# Patient Record
Sex: Female | Born: 1955
Health system: Southern US, Community
[De-identification: ages and names within clinical notes are randomized; demographics above are authoritative.]

## PROBLEM LIST (undated history)

## (undated) DIAGNOSIS — G8929 Other chronic pain: Secondary | ICD-10-CM

## (undated) DIAGNOSIS — M1990 Unspecified osteoarthritis, unspecified site: Secondary | ICD-10-CM

## (undated) DIAGNOSIS — M542 Cervicalgia: Secondary | ICD-10-CM

## (undated) DIAGNOSIS — J189 Pneumonia, unspecified organism: Secondary | ICD-10-CM

## (undated) DIAGNOSIS — K635 Polyp of colon: Secondary | ICD-10-CM

## (undated) DIAGNOSIS — J309 Allergic rhinitis, unspecified: Secondary | ICD-10-CM

## (undated) DIAGNOSIS — Z800 Family history of malignant neoplasm of digestive organs: Secondary | ICD-10-CM

## (undated) DIAGNOSIS — F419 Anxiety disorder, unspecified: Secondary | ICD-10-CM

## (undated) DIAGNOSIS — Z87442 Personal history of urinary calculi: Secondary | ICD-10-CM

## (undated) DIAGNOSIS — N809 Endometriosis, unspecified: Secondary | ICD-10-CM

## (undated) DIAGNOSIS — G4700 Insomnia, unspecified: Secondary | ICD-10-CM

## (undated) DIAGNOSIS — N951 Menopausal and female climacteric states: Secondary | ICD-10-CM

## (undated) DIAGNOSIS — Z9889 Other specified postprocedural states: Secondary | ICD-10-CM

## (undated) DIAGNOSIS — E039 Hypothyroidism, unspecified: Secondary | ICD-10-CM

## (undated) DIAGNOSIS — R112 Nausea with vomiting, unspecified: Secondary | ICD-10-CM

## (undated) DIAGNOSIS — Z8371 Family history of colonic polyps: Secondary | ICD-10-CM

## (undated) DIAGNOSIS — T7840XA Allergy, unspecified, initial encounter: Secondary | ICD-10-CM

## (undated) DIAGNOSIS — K589 Irritable bowel syndrome without diarrhea: Secondary | ICD-10-CM

## (undated) DIAGNOSIS — Z87448 Personal history of other diseases of urinary system: Secondary | ICD-10-CM

## (undated) DIAGNOSIS — Z83719 Family history of colon polyps, unspecified: Secondary | ICD-10-CM

## (undated) HISTORY — PX: OTHER SURGICAL HISTORY: SHX169

## (undated) HISTORY — PX: CARPAL TUNNEL RELEASE: SHX101

## (undated) HISTORY — DX: Allergic rhinitis, unspecified: J30.9

## (undated) HISTORY — DX: Family history of colon polyps, unspecified: Z83.719

## (undated) HISTORY — DX: Irritable bowel syndrome without diarrhea: K58.9

## (undated) HISTORY — DX: Cervicalgia: M54.2

## (undated) HISTORY — DX: Menopausal and female climacteric states: N95.1

## (undated) HISTORY — DX: Allergy, unspecified, initial encounter: T78.40XA

## (undated) HISTORY — DX: Anxiety disorder, unspecified: F41.9

## (undated) HISTORY — DX: Polyp of colon: K63.5

## (undated) HISTORY — PX: GALLBLADDER SURGERY: SHX652

## (undated) HISTORY — DX: Endometriosis, unspecified: N80.9

## (undated) HISTORY — DX: Insomnia, unspecified: G47.00

## (undated) HISTORY — DX: Other chronic pain: G89.29

## (undated) HISTORY — DX: Family history of malignant neoplasm of digestive organs: Z80.0

## (undated) HISTORY — DX: Family history of colonic polyps: Z83.71

## (undated) HISTORY — PX: ABDOMINAL HYSTERECTOMY: SHX81

## (undated) HISTORY — PX: DILATION AND CURETTAGE OF UTERUS: SHX78

## (undated) HISTORY — PX: SPINE SURGERY: SHX786

## (undated) HISTORY — DX: Hypothyroidism, unspecified: E03.9

## (undated) HISTORY — DX: Personal history of other diseases of urinary system: Z87.448

---

## 1998-01-10 ENCOUNTER — Encounter: Admission: RE | Admit: 1998-01-10 | Discharge: 1998-04-10 | Payer: Self-pay | Admitting: Anesthesiology

## 1998-03-08 ENCOUNTER — Inpatient Hospital Stay (HOSPITAL_COMMUNITY): Admission: RE | Admit: 1998-03-08 | Discharge: 1998-03-09 | Payer: Self-pay | Admitting: Neurosurgery

## 1998-04-09 ENCOUNTER — Ambulatory Visit (HOSPITAL_COMMUNITY): Admission: RE | Admit: 1998-04-09 | Discharge: 1998-04-09 | Payer: Self-pay

## 1998-06-11 ENCOUNTER — Ambulatory Visit (HOSPITAL_COMMUNITY): Admission: RE | Admit: 1998-06-11 | Discharge: 1998-06-11 | Payer: Self-pay | Admitting: Neurosurgery

## 1998-06-11 ENCOUNTER — Encounter: Payer: Self-pay | Admitting: Neurosurgery

## 1998-09-17 ENCOUNTER — Ambulatory Visit (HOSPITAL_COMMUNITY): Admission: RE | Admit: 1998-09-17 | Discharge: 1998-09-17 | Payer: Self-pay | Admitting: Neurosurgery

## 1998-09-17 ENCOUNTER — Encounter: Payer: Self-pay | Admitting: Neurosurgery

## 1999-01-02 ENCOUNTER — Encounter: Payer: Self-pay | Admitting: Neurosurgery

## 1999-01-02 ENCOUNTER — Ambulatory Visit (HOSPITAL_COMMUNITY): Admission: RE | Admit: 1999-01-02 | Discharge: 1999-01-02 | Payer: Self-pay | Admitting: Neurosurgery

## 1999-04-19 ENCOUNTER — Ambulatory Visit (HOSPITAL_COMMUNITY): Admission: RE | Admit: 1999-04-19 | Discharge: 1999-04-19 | Payer: Self-pay | Admitting: Neurosurgery

## 1999-04-19 ENCOUNTER — Encounter: Payer: Self-pay | Admitting: Neurosurgery

## 1999-10-24 ENCOUNTER — Other Ambulatory Visit: Admission: RE | Admit: 1999-10-24 | Discharge: 1999-10-24 | Payer: Self-pay | Admitting: Gynecology

## 2000-01-02 ENCOUNTER — Encounter: Payer: Self-pay | Admitting: Family Medicine

## 2000-01-02 ENCOUNTER — Ambulatory Visit (HOSPITAL_COMMUNITY): Admission: RE | Admit: 2000-01-02 | Discharge: 2000-01-02 | Payer: Self-pay | Admitting: Gastroenterology

## 2000-04-22 ENCOUNTER — Encounter: Admission: RE | Admit: 2000-04-22 | Discharge: 2000-04-22 | Payer: Self-pay | Admitting: Gynecology

## 2000-04-22 ENCOUNTER — Encounter: Payer: Self-pay | Admitting: Gynecology

## 2000-04-28 ENCOUNTER — Encounter: Payer: Self-pay | Admitting: Gynecology

## 2000-04-28 ENCOUNTER — Encounter: Admission: RE | Admit: 2000-04-28 | Discharge: 2000-04-28 | Payer: Self-pay | Admitting: Gynecology

## 2001-11-29 ENCOUNTER — Encounter: Admission: RE | Admit: 2001-11-29 | Discharge: 2001-11-29 | Payer: Self-pay | Admitting: Internal Medicine

## 2001-11-29 ENCOUNTER — Encounter: Payer: Self-pay | Admitting: Internal Medicine

## 2002-08-15 ENCOUNTER — Encounter: Admission: RE | Admit: 2002-08-15 | Discharge: 2002-08-15 | Payer: Self-pay | Admitting: Family Medicine

## 2002-08-15 ENCOUNTER — Encounter: Payer: Self-pay | Admitting: Family Medicine

## 2002-08-17 ENCOUNTER — Encounter: Admission: RE | Admit: 2002-08-17 | Discharge: 2002-08-17 | Payer: Self-pay | Admitting: Family Medicine

## 2002-08-17 ENCOUNTER — Encounter: Payer: Self-pay | Admitting: Family Medicine

## 2003-01-16 ENCOUNTER — Other Ambulatory Visit: Admission: RE | Admit: 2003-01-16 | Discharge: 2003-01-16 | Payer: Self-pay | Admitting: Gynecology

## 2004-05-26 ENCOUNTER — Emergency Department (HOSPITAL_COMMUNITY): Admission: EM | Admit: 2004-05-26 | Discharge: 2004-05-26 | Payer: Self-pay | Admitting: Emergency Medicine

## 2004-07-10 ENCOUNTER — Ambulatory Visit (HOSPITAL_COMMUNITY): Admission: RE | Admit: 2004-07-10 | Discharge: 2004-07-10 | Payer: Self-pay | Admitting: Gastroenterology

## 2005-08-06 ENCOUNTER — Encounter: Admission: RE | Admit: 2005-08-06 | Discharge: 2005-08-06 | Payer: Self-pay | Admitting: Family Medicine

## 2007-01-07 ENCOUNTER — Encounter: Admission: RE | Admit: 2007-01-07 | Discharge: 2007-01-07 | Payer: Self-pay | Admitting: Family Medicine

## 2008-03-24 ENCOUNTER — Encounter: Admission: RE | Admit: 2008-03-24 | Discharge: 2008-03-24 | Payer: Self-pay | Admitting: Surgery

## 2008-05-05 ENCOUNTER — Inpatient Hospital Stay (HOSPITAL_COMMUNITY): Admission: EM | Admit: 2008-05-05 | Discharge: 2008-05-13 | Payer: Self-pay | Admitting: Emergency Medicine

## 2008-05-12 ENCOUNTER — Encounter (INDEPENDENT_AMBULATORY_CARE_PROVIDER_SITE_OTHER): Payer: Self-pay | Admitting: Diagnostic Radiology

## 2008-05-15 ENCOUNTER — Ambulatory Visit (HOSPITAL_COMMUNITY): Admission: RE | Admit: 2008-05-15 | Discharge: 2008-05-15 | Payer: Self-pay | Admitting: Family Medicine

## 2008-05-26 ENCOUNTER — Ambulatory Visit: Payer: Self-pay | Admitting: Internal Medicine

## 2008-05-26 DIAGNOSIS — J90 Pleural effusion, not elsewhere classified: Secondary | ICD-10-CM | POA: Insufficient documentation

## 2008-05-29 ENCOUNTER — Telehealth: Payer: Self-pay | Admitting: Internal Medicine

## 2008-06-12 ENCOUNTER — Ambulatory Visit: Payer: Self-pay | Admitting: Internal Medicine

## 2008-06-13 LAB — CONVERTED CEMR LAB
BUN: 10 mg/dL (ref 6–23)
Basophils Absolute: 0.1 10*3/uL (ref 0.0–0.1)
Calcium: 9.2 mg/dL (ref 8.4–10.5)
Creatinine, Ser: 0.6 mg/dL (ref 0.4–1.2)
Eosinophils Absolute: 0.4 10*3/uL (ref 0.0–0.7)
GFR calc Af Amer: 134 mL/min
GFR calc non Af Amer: 111 mL/min
Glucose, Bld: 93 mg/dL (ref 70–99)
Hemoglobin: 13.3 g/dL (ref 12.0–15.0)
MCHC: 34.2 g/dL (ref 30.0–36.0)
Neutro Abs: 3.8 10*3/uL (ref 1.4–7.7)
Neutrophils Relative %: 51.9 % (ref 43.0–77.0)
Platelets: 307 10*3/uL (ref 150–400)
Sed Rate: 11 mm/hr (ref 0–22)
WBC: 7.4 10*3/uL (ref 4.5–10.5)

## 2008-06-23 ENCOUNTER — Telehealth: Payer: Self-pay | Admitting: Internal Medicine

## 2008-07-04 ENCOUNTER — Telehealth (INDEPENDENT_AMBULATORY_CARE_PROVIDER_SITE_OTHER): Payer: Self-pay | Admitting: *Deleted

## 2008-07-06 ENCOUNTER — Ambulatory Visit: Payer: Self-pay | Admitting: Cardiology

## 2008-07-26 ENCOUNTER — Ambulatory Visit: Payer: Self-pay | Admitting: Internal Medicine

## 2008-07-27 ENCOUNTER — Telehealth (INDEPENDENT_AMBULATORY_CARE_PROVIDER_SITE_OTHER): Payer: Self-pay | Admitting: *Deleted

## 2008-10-31 ENCOUNTER — Emergency Department (HOSPITAL_COMMUNITY): Admission: EM | Admit: 2008-10-31 | Discharge: 2008-10-31 | Payer: Self-pay | Admitting: Emergency Medicine

## 2010-12-10 NOTE — Group Therapy Note (Signed)
Kara Kennedy, Kara Kennedy NO.:  [id number]   MEDICAL RECORD NO.:  [id number]          PATIENT TYPE:  INP   LOCATION:  A336                          FACILITY:  [facility]   PHYSICIAN:  Dorris Singh, DO    DATE OF BIRTH:  Feb 15, 1956   DATE OF PROCEDURE:  DATE OF DISCHARGE:                                 PROGRESS NOTE   Patient seen today.  States she feels a little bit better, not much  however, but she is having issues with pain from the pleural  effusion,which is worsened.  Discussed with interventional radiology  until Friday they do not have someone who can do the thoracocentesis.  I  explained to the patient we will go ahead and continue to monitor her.  If her breathing gets worse, we will plan on transporting her down to  [facility] for it.  As long as she is stable, we will continue to just  wait until Friday to have it done.   VITAL SIGNS:  Temperature 98.3, pulse rate 104, respirations 24, blood  pressure 117/66.  GENERAL:  The patient is a Caucasian female who is well-developed, well-  nourished, in no acute distress.  HEART:  Tachy sinus, regular rate and rhythm.  S1 and S2 present.  LUNGS:  Clear to auscultation with some decreased breath sounds in the  right lobe.  ABDOMEN:  Soft, nontender, nondistended.  Bowel sounds in all four  quadrants.  EXTREMITIES:  No edema, ecchymosis or cyanosis.   Her labs for today are as follows:  White count of 13.4, hemoglobin of  11.7, hematocrit 34.2 and a platelet count of 284.  Her chemistry:  Sodium 136, potassium 3.4, chloride 99, CO2 30, glucose 128 and BUN 2  and creatinine 0.55.  Her calcium level is 8.3.   ASSESSMENT/PLAN:  1. Acute pneumonia, community-acquired.  She is doing well on the      Zosyn and Zithromax.  We will continue to monitor her with the      pleural effusion.  Plan for thoracocentesis on Friday.  2. Nausea and vomiting.  This is improved.  We will advance her diet.  3. Sepsis, which is  improved.  4. Hypothyroid, which is stable.   Plan on the patient being here for another 2 to 3 days as long as she  continues to improve.      Dorris Singh, DO  Electronically Signed     CB/MEDQ  D:  05/10/2008  T:  05/10/2008  Job:  [id number]

## 2010-12-10 NOTE — H&P (Signed)
NAMEDANAKA, LLERA NO.:  [id number]   MEDICAL RECORD NO.:  [id number]          PATIENT TYPE:  INP   LOCATION:  A336                          FACILITY:  [facility]   PHYSICIAN:  Thomasenia Bottoms, MDDATE OF BIRTH:  January 23, 1956   DATE OF ADMISSION:  05/05/2008  DATE OF DISCHARGE:  LH                              HISTORY & PHYSICAL   CHIEF COMPLAINT:  Pain with breathing.   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  She says the pain is worse on the  right side when she takes a deep breath.  It is so bad that she finds it  hard to take a deep breath.  She is feeling short of breath.  These  symptoms started yesterday.  She said 2 days ago, she just did not feel  well, had no energy, but did not have any specific complaints until  yesterday when this trouble with breathing started.  She has not had any  cough.  She has had episodes of severe sweating.  Also some nausea,  vomiting.  No diarrhea.   PAST MEDICAL HISTORY:  1. Significant for a laparoscopic cholecystectomy which was done as an      outpatient in August 2009.  This was followed by shingles in the      right upper hip area.  The patient did require some prednisone.      She is still having some mild neuropathic pain.  2. She also has a history of hypothyroidism.   MEDICATIONS ON ARRIVAL:  1. Synthroid 100 mcg p.o. daily.  2. Premarin 0.9 mg p.o. daily.  3. Vicodin 5/500 p.r.n.  4. Neurontin 300 mg p.o. t.i.d. p.r.n.   SOCIAL HISTORY:  The patient does not smoke cigarettes or drink a  significant amount of alcohol.  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   FAMILY HISTORY:  Noncontributory.   REVIEW OF SYSTEMS:  CONSTITUTIONAL:  She has a mild headache, but this  is unusual for her.  No double vision.  Her appetite has been excellent.  She has had no weight loss.  HEENT:  No sore throat.  CARDIOVASCULAR:  She has this pleuritic  chest pain as mentioned above.  No lower  extremity edema.  RESPIRATORY:  Dyspnea as mentioned above.  No  hemoptysis.  GI:  She has been nauseated and vomited once or twice.  No  diarrhea.  MUSCULOSKELETAL:  She did have some mild diffuse joint aches  or achiness earlier.  PSYCHIATRIC:  She has no trouble with insomnia.  All other systems reviewed and are negative.   PHYSICAL EXAMINATION:  VITAL SIGNS:  On arrival in the emergency  department, her blood pressure was 112/73, but it did drop to 84/47.  Her temperature max was 99.1, pulse 111, respiratory rate up to 34, O2  sats 91% on room air, 93% on 2 liters nasal cannula.  GENERAL:  On physical examination, the patient is well-nourished, well-  developed, but is slightly tachypneic, slightly ill-appearing.  HEENT:  Normocephalic, atraumatic.  Pupils are equal and round.  Sclerae  nonicteric.  Oral mucosa moist.  NECK:  Supple.  No lymphadenopathy, no thyromegaly, no jugular venous  distention.  CARDIAC:  Regular, slightly tachycardiac.  No murmur  appreciated.  LUNGS:  Reveal almost absent breath sounds in the right base.  She has  some minimal air movement on the left.  She is having a difficult time  taking a deep breath because of the pain and she is splinting.  ABDOMEN:  Soft, nontender, nondistended.  No hepatosplenomegaly.  EXTREMITIES:  Reveal no evidence of clubbing, cyanosis or pitting edema.  Her DP pulses on the left are not palpable.  On the right, they are  2+.  However, skin is warm and dry, good color.  Normal temperature.  NEUROLOGICALLY:  She is alert and oriented x3.  Cranial nerves II-XII  are intact grossly.  She has 5/5 strength in her upper and lower  extremities.  Her sensory exam is intact grossly in her upper and lower  extremities.  Babinski reflexes are equivocal.  She has normal muscle  tone and bulk.  MUSCULOSKELETAL:  Reveals good range of motion.  No effusions of her  joints.  SKIN:  Intact with no  open lesions or rashes.  She does have some  varicosities in the left lower leg and ankle area.   DIAGNOSTICS:  1. Her chest x-ray reports right greater than left pleural effusion      and confluent bibasilar airspace opacity.  2. Her EKG reveals normal sinus rhythm with a rate of 96.  She has      some nonspecific T-wave abnormalities, but no ST-segment elevation      or depression.   LABORATORY DATA:  Albumin is 3.5, AST 24, ALT 20, alk phos 59, total  bili normal at 0.9, sodium is 135, potassium 3.9, chloride 99, bicarb  29, glucose 131, BUN 12, creatinine 0.81, white count of 17.0,  hemoglobin 13.3, hematocrit of 39.3, platelet count is 255.   ASSESSMENT/PLAN:  1. Bibasilar pneumonia and sepsis.  The patient also has bilateral      small pleural effusions.  We will put her on IV Rocephin and      Zithromax.  She was briefly on 1 small course of prednisone, but      has been off it for 1 month, so I do not think there is a high      likelihood of Pseudomonas pneumonia.  She did have 1 day surgery,      but has not been hospitalized, so I am not going to empirically      treat her with vancomycin.  We will go ahead and check an ABG as      she is slightly cachectic.  We will continue her IV fluids and her      oxygen.  With a fluid bolus of 1 liter in the emergency department,      her systolic blood pressure has come from the 80s and is now 108,      so I feel that she is stable for the floor, but she is clearly ill      and will need to be monitored carefully.  2. For her hypothyroidism, we will continue her Synthroid.  3. For her menopausal symptoms, we will continue her Premarin.      Thomasenia Bottoms, MD  Electronically Signed     CVC/MEDQ  D:  05/05/2008  T:  05/06/2008  Job:  [id number]   cc:   [facility]  Fax: [phone]

## 2010-12-10 NOTE — Discharge Summary (Signed)
Kara Kennedy, Kara Kennedy NO.:  [id number]   MEDICAL RECORD NO.:  [id number]          PATIENT TYPE:  INP   LOCATION:  A336                          FACILITY:  [facility]   PHYSICIAN:  Dorris Singh, DO    DATE OF BIRTH:  [birth year]   DATE OF ADMISSION:  05/05/2008  DATE OF DISCHARGE:  10/17/2009LH                               DISCHARGE SUMMARY   ADMISSION DIAGNOSES:  Include  1. Bibasilar pneumonia and sepsis.  2. Hypothyroidism.  3. Menopausal symptoms.   DISCHARGE DIAGNOSES:  Include:  1. Bibasilar pneumonia with pleural effusion.  2. Pleurisy.  3. Hypothyroidism.  4. Menopause.  5. Mild hyponatremia.   Her primary care physician is Dr. Arlyce Dice.   Testing that was done includes radiographic testing. She had on October  9 a two-view of the chest which demonstrated right greater than left  pleural effusion with confluent bibasilar air space opacity.  The latter  could reflect atelectasis, infection, or aspiration. She had an  ultrasound of the abdomen on October 10 which demonstrated status post  cholecystectomy, echogenic foci in the region of the distal common duct  not definitely within the duct, mild predominant bile duct and  pancreatic most likely following cholecystectomy. Mild predominant left  renal collecting system without significant change. On the 13th she had  a two-view chest which showed bibasilar effusions, and atelectasis well  as bibasilar infiltrates, increase in the right pleural effusion since  previous study. On the 16th she had an ultrasound-guided thoracocentesis  which only pulled out 5 mL of tea-colored fluid. And on the 16th she  also had a chest x-ray which showed no pneumothorax following right  thoracocentesis, persistent bibasilar atelectasis, right lower lobe  consolidation, and loculated right pleural effusion.   Her H and P was done by Dr. 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  She was started on IV  Zithromax and those and Rocephin and vancomycin.  She continued to  complain of right-sided chest pain throughout her stay here which is  possibly due to this pleural effusion, even though not much fluid was  pulled. On the 17th she progressively improved.  She was able to  ambulate the halls, and it was determined she could be sent home.   For her menopausal symptoms she was placed on her Premarin. However, due  to the medications that she was on we had to withhold that until the day  prior to admission.   And for her hypothyroidism she was continued on her Synthroid the whole  time.   Her discharge instructions for her medications, she was put on Synthroid  100 mcg one p.o. daily, Premarin 0.9 mg 1 p.o. daily, and Vicodin 5/500  as needed. For her new prescription she was given a Zithromax Z-Pak as  directed, Roxicodone 10 mg 1/2 tablet to 1 tablet p.o. q.4h. p.r.n. pain  #20, albuterol 0.25 mg nebs q.4h. p.r.n. Phenergan 25 mg 1/2 tablet p.o.  q.6h. Proventil inhaler use 1 puff q.4h. p.r.n.   And her discharge instructions were to follow up with Dr. Arlyce Dice in 2-5  days, especially after thoracocentesis. She is to make an appointment  with note that she has just been hospitalized. Also if her right-sided  pleuritic pain continues, she will need proper follow-up.  She is to  increase her activity slowly.  She is to increase her diet as tolerated.  Also she was given a script for nebulizer and tubing. She is to use this  if needed.  And she is to follow up with her PCP and to finish her  antibiotic therapy.   The patient's condition at discharge is stable.   PHYSICAL EXAMINATION:  VITAL SIGNS:  Her vital signs are as follows:  Temperature 98.3, pulse 91, respirations 20, blood pressure 101/61.  GENERAL: The patient is 55 year old Caucasian female who is well  developed, well  nourished, in no acute distress.  HEART:  Regular rate and rhythm.  LUNGS: Clear to auscultation. Left lung with diminished breath sounds in  the right, unchanged from previous exam.  ABDOMEN: Soft, nontender, and nondistended.  EXTREMITIES:  Legs positive pulses.  No edema, ecchymosis or cyanosis.   Her labs for today white count of 14.1, hemoglobin 11.7, hematocrit  34.2, platelet count of 532.  Sodium is 133, potassium 3.9, chloride 98,  CO2 of 28, glucose 115, and BUN 3.   Disposition is to home, and her condition is stable.  The patient has  been instructed to return if symptoms worsen. >30 minutes on dc summary      Dorris Singh, DO  Electronically Signed     CB/MEDQ  D:  05/13/2008  T:  05/13/2008  Job:  [id number]

## 2010-12-10 NOTE — Group Therapy Note (Signed)
Kara Kennedy, CAMPLIN NO.:  [id number]   MEDICAL RECORD NO.:  [id number]          PATIENT TYPE:  INP   LOCATION:  A336                          FACILITY:  [facility]   PHYSICIAN:  Osvaldo Shipper, MD     DATE OF BIRTH:  [birth year]   DATE OF PROCEDURE:  05/09/2008  DATE OF DISCHARGE:                                 PROGRESS NOTE   SUBJECTIVE:  The patient is finally starting to feel better, breathing  is better, but she does get short of breath even with minimal exertion.  She says the nausea and vomiting has subsided.  She was able to tolerate  p.o. intake.  Denies any other complaint.   OBJECTIVE:  VITAL SIGNS:  Temperature is 99.4 overnight, heart rate in  the 90s, respiratory rate 24.  Blood pressure 123/61, saturation 94% on  2L.  GENERAL:  Thin white female in no distress.  HEENT:  No pallor.  No icterus.  Mucous membranes are moist.  No oral  lesions are noted.  NECK:  Soft and supple.  LUNGS:  Decreased air entry on the right side.  No crackles appreciated.  On the left side, air entry is normal.  CARDIOVASCULAR:  S1, S2, normal regular.  No murmurs appreciated.  No  S3, S4.  No rubs.  No bruits.  ABDOMEN:  Soft, nontender, nondistended.  Bowel sounds present.  No mass  or organomegaly appreciated.  EXTREMITIES:  Do not show any edema.  MUSCULOSKELETAL:  Unremarkable.  NEUROLOGIC:  She is alert and oriented x3.  No focal neurologic deficits  present.  GU:  Deferred.   LABS:  White count is 12,200, hemoglobin 12.1, platelet count is  267,000.  No bands reported today.  Sodium 134, potassium 3.1, chloride  100, bicarb 29, glucose 103, alkaline phosphatase 176, albumin 2.1.   ASSESSMENT AND PLAN:  1. Acute pneumonia, most likely community acquired.  The patient is      improving slowly.  White count is getting better.  We will continue      with her IV antibiotics for now.  She is on Zosyn and azithromycin.      It is time to repeat her chest x-ray because  of pleural effusion      that was noted on the previous film.  We will get a 2-view film      today.  2. Nausea and vomiting has improved.  It was likely because of her      pneumonia.  She is on IV PPI, IV fluids, this should all be      continued for now.  3. Sepsis, improved.  4. Headache.  Improved.  5. Hypothyroidism, stable.  6. She is on full liquids, which will be continued for today and if      she feels better tomorrow, we will advance it to maybe heart      healthy.  IV fluids to continue today at 80 mL an hour.  We will      check her magnesium level and give potassium p.o.   She is a Full  Code.   We will await magnesium level and chest x-ray before deciding a further  course of action.  It is likely that the patient will require at least 2  to 3 days more in the hospital before she can be discharged home.   ADDENDUM:  Patient's CXR showed increase in pleural effusions. An ultrasound guided  thoracentesis was arranged.      Osvaldo Shipper, MD  Electronically Signed     GK/MEDQ  D:  05/09/2008  T:  05/09/2008  Job:  [id number]   cc:   [facility]  Fax: [phone]

## 2010-12-10 NOTE — Group Therapy Note (Signed)
NAMEJUMANAH, HYNSON NO.:  [id number]   MEDICAL RECORD NO.:  [id number]          PATIENT TYPE:  INP   LOCATION:  A336                          FACILITY:  [facility]   PHYSICIAN:  Dorris Singh, DO    DATE OF BIRTH:  01-May-1956   DATE OF PROCEDURE:  05/11/2008  DATE OF DISCHARGE:                                 PROGRESS NOTE   The patient is seen today.  States that she is doing okay today.  Really  no improvement from yesterday.  We are waiting a thoracocentesis, which  will be done first thing tomorrow morning.  Her pain has been kept under  better control, particularly on the right side of her chest.   Her vitals are as follows:  Temperature 98.9, pulse 105, respirations  18, blood pressure 100/59.  GENERAL:  The patient is a 55 year old Caucasian female who is well-  developed, well-nourished in no acute distress.  HEAD:  Normocephalic.  HEART:  Is tachy sinus.  No murmurs noted.  LUNGS:  Decreased breath sounds on the right greater than left.  ABDOMEN:  Soft, nontender, nondistended.  EXTREMITIES:  Positive pulses.   LABORATORIES:  For today are as follows.  Her white count is 14.2,  hemoglobin 11.8, hematocrit 34.5 and platelet count of 333.  Sodium is  136, potassium 3.7.  Chloride is 98, CO2 is 30, glucose 121, BUN is 1,  creatinine 0.60.   ASSESSMENT/PLAN:  1. Acute pneumonia with pleural effusions right greater than left.      The plan is for a thoracocentesis on Friday.  Patient is continuing      to do well.  We will continue her on current antibiotic regimen.  2. Nausea and vomiting.  This is improved.  3. Pain.  The patient states this is also improved with pain      medication.  We will continue to monitor.  If she is doing well,      anticipate discharge in the next 1-2 days.      Dorris Singh, DO  Electronically Signed     CB/MEDQ  D:  05/11/2008  T:  05/11/2008  Job:  [id number]

## 2010-12-13 NOTE — Op Note (Signed)
NAMEMARIEANNE, Kara Kennedy                ACCOUNT NO.:  [id number]   MEDICAL RECORD NO.:  [id number]          PATIENT TYPE:  AMB   LOCATION:  ENDO                         FACILITY:  [facility]   PHYSICIAN:  Anselmo Rod, M.D.  DATE OF BIRTH:  January 18, 1956   DATE OF PROCEDURE:  07/10/2004  DATE OF DISCHARGE:                                 OPERATIVE REPORT   PROCEDURE PERFORMED:  Screening colonoscopy.   ENDOSCOPIST:  Anselmo Rod, M.D.   INSTRUMENT USED:  Olympus video colonoscope.   INDICATION FOR PROCEDURE:  A 55 year old white female undergoing screening  colonoscopy.  The patient has a personal history of colonic polyps and a  family history of colon cancer in her mother.  She has experienced  occasional BRBPR with hard stool but denies a change in bowel habits.   PREPROCEDURE PREPARATION:  Informed consent was procured from the patient.  The patient was fasted for eight hours prior to the procedure and prepped  with a bottle of magnesium citrate and a gallon of GoLYTELY the night prior  to the procedure.  The risks and benefits of the procedure, including a 10%  miss rate of colon cancer and polyps, was developed with the patient as  well.   PREPROCEDURE PHYSICAL:  VITAL SIGNS:  The patient had stable vital signs.  NECK:  Supple.  CHEST:  Clear to auscultation.  S1, S2 regular.  ABDOMEN:  Soft with normal bowel sounds.   DESCRIPTION OF PROCEDURE:  The patient was placed in the left lateral  decubitus position and sedated with 100 mg of Demerol and 7.5 mg of Versed  in slow incremental doses.  Once the patient was adequately sedate and  maintained on low-flow oxygen and continuous cardiac monitoring, the Olympus  video colonoscope was advanced from the rectum to the cecum.  There was  evidence of early sigmoid diverticulosis.  No masses or polyps were seen.  Retroflexion in the rectum revealed no abnormalities.  The patient tolerated  the procedure well without complication.   IMPRESSION:  1.  Early sigmoid diverticulosis.  2.  No other masses or polyps seen.   RECOMMENDATIONS:  1.  Continue a high-fiber diet with liberal fluid intake.  2.  Repeat colonoscopy in the next five years unless the patient develops      any abnormal symptoms in the interim.  3.  Outpatient follow-up as the need arises in the future.      Jyot   JNM/MEDQ  D:  07/10/2004  T:  07/10/2004  Job:  [id number]   cc:   Luvenia Redden, M.D.  [address]  [place]  [place] [address]  Fax: [phone]

## 2011-04-29 LAB — DIFFERENTIAL
Band Neutrophils: 23 — ABNORMAL HIGH
Basophils Absolute: 0
Basophils Absolute: 0
Basophils Absolute: 0
Basophils Relative: 0
Basophils Relative: 0
Basophils Relative: 0
Basophils Relative: 0
Blasts: 0
Blasts: 0
Eosinophils Absolute: 0
Eosinophils Absolute: 0
Eosinophils Absolute: 0
Eosinophils Absolute: 0.1
Eosinophils Absolute: 0.2
Eosinophils Absolute: 0.3
Eosinophils Relative: 0
Eosinophils Relative: 1
Eosinophils Relative: 1
Eosinophils Relative: 1
Eosinophils Relative: 1
Eosinophils Relative: 2
Lymphocytes Relative: 12
Lymphocytes Relative: 4 — ABNORMAL LOW
Lymphocytes Relative: 7 — ABNORMAL LOW
Lymphocytes Relative: 9 — ABNORMAL LOW
Lymphs Abs: 0.9
Lymphs Abs: 0.9
Lymphs Abs: 1.3
Lymphs Abs: 1.7
Metamyelocytes Relative: 0
Monocytes Absolute: 0 — ABNORMAL LOW
Monocytes Absolute: 0.2
Monocytes Absolute: 0.8
Monocytes Absolute: 0.8
Monocytes Absolute: 1.3 — ABNORMAL HIGH
Monocytes Absolute: 1.5 — ABNORMAL HIGH
Monocytes Relative: 0 — ABNORMAL LOW
Monocytes Relative: 1 — ABNORMAL LOW
Monocytes Relative: 1 — ABNORMAL LOW
Monocytes Relative: 11
Monocytes Relative: 4
Monocytes Relative: 6
Myelocytes: 0
Neutro Abs: 19.6 — ABNORMAL HIGH
Neutro Abs: 9 — ABNORMAL HIGH
Neutrophils Relative %: 64
Neutrophils Relative %: 95 — ABNORMAL HIGH
Promyelocytes Absolute: 0
Promyelocytes Absolute: 0
WBC Morphology: INCREASED
nRBC: 0
nRBC: 0

## 2011-04-29 LAB — CBC
HCT: 33.2 — ABNORMAL LOW
HCT: 34.2 — ABNORMAL LOW
HCT: 34.2 — ABNORMAL LOW
HCT: 34.5 — ABNORMAL LOW
HCT: 39.3
Hemoglobin: 11.1 — ABNORMAL LOW
Hemoglobin: 11.5 — ABNORMAL LOW
Hemoglobin: 11.7 — ABNORMAL LOW
Hemoglobin: 11.8 — ABNORMAL LOW
Hemoglobin: 12.3
MCHC: 33.7
MCHC: 33.9
MCHC: 34
MCHC: 34.3
MCHC: 34.4
MCHC: 34.5
MCV: 88.8
MCV: 89.1
MCV: 89.5
MCV: 89.8
MCV: 90.2
MCV: 90.6
Platelets: 209
Platelets: 267
Platelets: 284
Platelets: 532 — ABNORMAL HIGH
RBC: 3.57 — ABNORMAL LOW
RBC: 3.7 — ABNORMAL LOW
RBC: 3.83 — ABNORMAL LOW
RBC: 4
RDW: 13.1
RDW: 13.2
RDW: 13.3
RDW: 13.3
RDW: 13.5
WBC: 13.4 — ABNORMAL HIGH
WBC: 14.1 — ABNORMAL HIGH
WBC: 14.1 — ABNORMAL HIGH
WBC: 17 — ABNORMAL HIGH
WBC: 20.6 — ABNORMAL HIGH

## 2011-04-29 LAB — URINALYSIS, ROUTINE W REFLEX MICROSCOPIC
Glucose, UA: NEGATIVE
Ketones, ur: 15 — AB
Nitrite: NEGATIVE
Protein, ur: NEGATIVE
Urobilinogen, UA: 0.2

## 2011-04-29 LAB — BASIC METABOLIC PANEL
BUN: 10
BUN: 2 — ABNORMAL LOW
BUN: 3 — ABNORMAL LOW
CO2: 24
CO2: 24
CO2: 27
CO2: 30
Calcium: 7.6 — ABNORMAL LOW
Calcium: 8.2 — ABNORMAL LOW
Calcium: 9
Chloride: 105
Chloride: 105
Chloride: 98
Chloride: 99
Creatinine, Ser: 0.56
Creatinine, Ser: 0.59
Creatinine, Ser: 0.66
GFR calc Af Amer: >60
GFR calc Af Amer: >60
GFR calc Af Amer: >60
GFR calc non Af Amer: >60
GFR calc non Af Amer: >60
GFR calc non Af Amer: >60
Glucose, Bld: 107 — ABNORMAL HIGH
Glucose, Bld: 119 — ABNORMAL HIGH
Glucose, Bld: 121 — ABNORMAL HIGH
Potassium: 3.3 — ABNORMAL LOW
Potassium: 3.4 — ABNORMAL LOW
Potassium: 3.7
Potassium: 3.9
Sodium: 133 — ABNORMAL LOW
Sodium: 134 — ABNORMAL LOW
Sodium: 136
Sodium: 141
Sodium: 141

## 2011-04-29 LAB — BLOOD GAS, ARTERIAL
Acid-Base Excess: 0
Bicarbonate: 24.3 — ABNORMAL HIGH
O2 Content: 2
O2 Saturation: 94.7
pO2, Arterial: 73.7 — ABNORMAL LOW

## 2011-04-29 LAB — COMPREHENSIVE METABOLIC PANEL
AST: 10
Albumin: 2.1 — ABNORMAL LOW
CO2: 29
Calcium: 8 — ABNORMAL LOW
Creatinine, Ser: 0.63
GFR calc Af Amer: >60
GFR calc non Af Amer: >60
Total Protein: 5.1 — ABNORMAL LOW

## 2011-04-29 LAB — HEPATIC FUNCTION PANEL
ALT: 15
ALT: 20
AST: 16
Albumin: 2.4 — ABNORMAL LOW
Albumin: 3.5
Alkaline Phosphatase: 102
Bilirubin, Direct: 0.1
Indirect Bilirubin: 0.3
Total Bilirubin: 0.9
Total Protein: 5 — ABNORMAL LOW
Total Protein: 5.6 — ABNORMAL LOW

## 2011-04-29 LAB — CULTURE, BLOOD (ROUTINE X 2)
Culture: NO GROWTH
Culture: NO GROWTH
Report Status: 10142009

## 2011-04-29 LAB — MAGNESIUM
Magnesium: 1.7
Magnesium: 1.7

## 2011-04-29 LAB — CULTURE, BODY FLUID W GRAM STAIN -BOTTLE: Report Status: 10212009

## 2011-04-29 LAB — LIPASE, BLOOD: Lipase: 10 — ABNORMAL LOW

## 2011-08-19 DIAGNOSIS — G4700 Insomnia, unspecified: Secondary | ICD-10-CM

## 2011-08-19 HISTORY — DX: Insomnia, unspecified: G47.00

## 2012-07-08 ENCOUNTER — Other Ambulatory Visit: Payer: Self-pay | Admitting: Gynecology

## 2012-07-30 DIAGNOSIS — J309 Allergic rhinitis, unspecified: Secondary | ICD-10-CM

## 2012-07-30 DIAGNOSIS — Z87448 Personal history of other diseases of urinary system: Secondary | ICD-10-CM

## 2012-07-30 HISTORY — DX: Personal history of other diseases of urinary system: Z87.448

## 2012-07-30 HISTORY — DX: Allergic rhinitis, unspecified: J30.9

## 2012-09-06 DIAGNOSIS — G8929 Other chronic pain: Secondary | ICD-10-CM

## 2012-09-06 HISTORY — DX: Other chronic pain: G89.29

## 2014-03-06 DIAGNOSIS — E039 Hypothyroidism, unspecified: Secondary | ICD-10-CM

## 2014-03-06 DIAGNOSIS — F419 Anxiety disorder, unspecified: Secondary | ICD-10-CM

## 2014-03-06 DIAGNOSIS — N951 Menopausal and female climacteric states: Secondary | ICD-10-CM

## 2014-03-06 HISTORY — DX: Menopausal and female climacteric states: N95.1

## 2014-03-06 HISTORY — DX: Hypothyroidism, unspecified: E03.9

## 2014-03-06 HISTORY — DX: Anxiety disorder, unspecified: F41.9

## 2015-05-09 ENCOUNTER — Ambulatory Visit: Payer: Self-pay | Admitting: *Deleted

## 2015-06-29 ENCOUNTER — Encounter: Payer: Self-pay | Admitting: *Deleted

## 2015-07-04 ENCOUNTER — Ambulatory Visit (INDEPENDENT_AMBULATORY_CARE_PROVIDER_SITE_OTHER): Payer: BC Managed Care – PPO | Admitting: *Deleted

## 2015-07-04 DIAGNOSIS — I8393 Asymptomatic varicose veins of bilateral lower extremities: Secondary | ICD-10-CM

## 2015-07-04 NOTE — Progress Notes (Signed)
   Cutaneous Laser:pulsed mode  810j/cm2 400 ms delay  13 ms Duration 0.5 spot  Total pulses: 2014 Total energy 3.194  Total time::26  Photos: No.  Compression stockings applied: Yes.     Unsure what this lady has. They are tiny red vessels (?). Too small to inject. Had Dr. Oneida Alar consult. He agreed that I try cutaneous laser. Thought maybe the redness was staining/unsure. Used the cutaneous laser and maybe they faded some. Hard to tell since they were red before, did blanche when lased, but then came back red. I will call [facility] to see if they have any ideas like maybe a probe with a larger head that could get large areas of redness. I will call the patient on Tuesday and let her know what I find out and to check on her status.

## 2015-07-05 ENCOUNTER — Encounter: Payer: Self-pay | Admitting: *Deleted

## 2015-10-23 ENCOUNTER — Institutional Professional Consult (permissible substitution): Payer: BC Managed Care – PPO | Admitting: Pulmonary Disease

## 2016-06-02 MED FILL — GABAPENTIN 300 MG CAPSULE: 300 | 30 days supply | Qty: 90 | Fill #0

## 2016-06-11 MED FILL — MONTELUKAST SOD 10 MG TAB: 10 | 90 days supply | Qty: 90 | Fill #0

## 2016-06-25 MED FILL — PREMARIN 0.625 MG TABLET: 0.625 | 90 days supply | Qty: 90 | Fill #0

## 2016-06-25 MED FILL — LEVOTHYROXINE 100 MCG TAB: 100 | 90 days supply | Qty: 90 | Fill #0

## 2016-06-25 MED FILL — ALPRAZolam 0.5 MG TABS: 0.5 | 30 days supply | Qty: 30 | Fill #0

## 2016-06-27 DIAGNOSIS — E039 Hypothyroidism, unspecified: Secondary | ICD-10-CM | POA: Diagnosis not present

## 2016-06-30 MED FILL — SYNTHROID 88 MCG TABLET: 88 | 90 days supply | Qty: 90 | Fill #0

## 2016-07-01 DIAGNOSIS — J0100 Acute maxillary sinusitis, unspecified: Secondary | ICD-10-CM | POA: Diagnosis not present

## 2016-07-01 DIAGNOSIS — Z86010 Personal history of colonic polyps: Secondary | ICD-10-CM | POA: Diagnosis not present

## 2016-07-01 DIAGNOSIS — Z800 Family history of malignant neoplasm of digestive organs: Secondary | ICD-10-CM | POA: Diagnosis not present

## 2016-07-01 DIAGNOSIS — K5900 Constipation, unspecified: Secondary | ICD-10-CM | POA: Diagnosis not present

## 2016-07-01 MED FILL — GABAPENTIN 300 MG CAPSULE: 300 | 30 days supply | Qty: 90 | Fill #1

## 2016-07-01 MED FILL — CEFDINIR 300 MG CAPSULE: 300 | 10 days supply | Qty: 20 | Fill #0

## 2016-07-07 MED FILL — LINZESS 290 MCG CAPSULE: 290 | 30 days supply | Qty: 30 | Fill #0

## 2016-08-01 MED FILL — LINZESS 290 MCG CAPSULE: 290 | 30 days supply | Qty: 30 | Fill #1

## 2016-08-01 MED FILL — FLUoxetine HCL 20 MG CAPS: 20 | 90 days supply | Qty: 90 | Fill #0

## 2016-08-01 MED FILL — GABAPENTIN 300 MG CAPSULE: 300 | 30 days supply | Qty: 90 | Fill #2

## 2016-08-05 DIAGNOSIS — R6883 Chills (without fever): Secondary | ICD-10-CM | POA: Diagnosis not present

## 2016-08-05 DIAGNOSIS — J101 Influenza due to other identified influenza virus with other respiratory manifestations: Secondary | ICD-10-CM | POA: Diagnosis not present

## 2016-08-05 DIAGNOSIS — Z1231 Encounter for screening mammogram for malignant neoplasm of breast: Secondary | ICD-10-CM | POA: Diagnosis not present

## 2016-08-05 MED FILL — HYDROCODONE-HOMATROPINE SYR: 5-1.5 | 6 days supply | Qty: 240 | Fill #0

## 2016-08-05 MED FILL — OSELTAMIVIR PHOS 75 MG CAP: 75 | 5 days supply | Qty: 10 | Fill #0

## 2016-08-06 ENCOUNTER — Other Ambulatory Visit: Payer: Self-pay | Admitting: Physician Assistant

## 2016-08-06 DIAGNOSIS — Z1231 Encounter for screening mammogram for malignant neoplasm of breast: Secondary | ICD-10-CM

## 2016-08-11 DIAGNOSIS — K5909 Other constipation: Secondary | ICD-10-CM | POA: Diagnosis not present

## 2016-08-11 DIAGNOSIS — R300 Dysuria: Secondary | ICD-10-CM | POA: Diagnosis not present

## 2016-08-11 DIAGNOSIS — Z1231 Encounter for screening mammogram for malignant neoplasm of breast: Secondary | ICD-10-CM | POA: Diagnosis not present

## 2016-09-03 MED FILL — MONTELUKAST SOD 10 MG TAB: 10 | 90 days supply | Qty: 90 | Fill #1

## 2016-09-03 MED FILL — LINZESS 290 MCG CAPSULE: 290 | 30 days supply | Qty: 30 | Fill #2

## 2016-09-03 MED FILL — GABAPENTIN 300 MG CAPSULE: 300 | 30 days supply | Qty: 90 | Fill #3

## 2016-09-19 DIAGNOSIS — R52 Pain, unspecified: Secondary | ICD-10-CM | POA: Diagnosis not present

## 2016-09-19 DIAGNOSIS — J111 Influenza due to unidentified influenza virus with other respiratory manifestations: Secondary | ICD-10-CM | POA: Diagnosis not present

## 2016-09-19 MED FILL — HYDROCODONE-CHLORPHENIRAM S: 10-8 | 6 days supply | Qty: 90 | Fill #0

## 2016-09-19 MED FILL — OSELTAMIVIR PHOSPHATE 75 MG: 75 | 5 days supply | Qty: 10 | Fill #0

## 2016-09-26 MED FILL — SYNTHROID 88 MCG TABLET: 88 | 90 days supply | Qty: 90 | Fill #0

## 2016-09-26 MED FILL — PREMARIN 0.625 MG TABLET: 0.625 | 90 days supply | Qty: 90 | Fill #1

## 2016-09-27 MED FILL — GABAPENTIN 300 MG CAPSULE: 300 | 30 days supply | Qty: 90 | Fill #4

## 2016-09-27 MED FILL — LINZESS 290 MCG CAPSULE: 290 | 30 days supply | Qty: 30 | Fill #3

## 2016-09-30 DIAGNOSIS — E039 Hypothyroidism, unspecified: Secondary | ICD-10-CM | POA: Diagnosis not present

## 2016-10-29 MED FILL — SYNTHROID 88 MCG TABLET: 88 | 40 days supply | Qty: 40 | Fill #0

## 2016-10-30 MED FILL — LINZESS 290 MCG CAPSULE: 290 | 30 days supply | Qty: 30 | Fill #4

## 2016-10-30 MED FILL — GABAPENTIN 300 MG CAPSULE: 300 | 30 days supply | Qty: 90 | Fill #5

## 2016-11-21 DIAGNOSIS — S0501XA Injury of conjunctiva and corneal abrasion without foreign body, right eye, initial encounter: Secondary | ICD-10-CM | POA: Diagnosis not present

## 2016-11-24 MED FILL — LINZESS 290 MCG CAPSULE: 290 | 30 days supply | Qty: 30 | Fill #5

## 2016-11-24 MED FILL — GABAPENTIN 300 MG CAPSULE: 300 | 30 days supply | Qty: 90 | Fill #6

## 2016-12-01 MED FILL — FLUoxetine HCL 20 MG CAPS: 20 | 90 days supply | Qty: 90 | Fill #0

## 2016-12-09 MED FILL — MONTELUKAST SOD 10 MG TAB: 10 | 90 days supply | Qty: 90 | Fill #2

## 2016-12-23 ENCOUNTER — Encounter (INDEPENDENT_AMBULATORY_CARE_PROVIDER_SITE_OTHER): Payer: 59 | Admitting: Podiatry

## 2016-12-23 NOTE — Progress Notes (Signed)
This encounter was created in error - please disregard.

## 2016-12-29 ENCOUNTER — Ambulatory Visit (INDEPENDENT_AMBULATORY_CARE_PROVIDER_SITE_OTHER): Payer: 59 | Admitting: Family Medicine

## 2016-12-29 ENCOUNTER — Encounter: Payer: Self-pay | Admitting: Family Medicine

## 2016-12-29 VITALS — BP 116/66 | HR 72 | Temp 97.7°F | Ht 63.0 in | Wt 145.8 lb

## 2016-12-29 DIAGNOSIS — Z800 Family history of malignant neoplasm of digestive organs: Secondary | ICD-10-CM | POA: Insufficient documentation

## 2016-12-29 DIAGNOSIS — K581 Irritable bowel syndrome with constipation: Secondary | ICD-10-CM | POA: Diagnosis not present

## 2016-12-29 DIAGNOSIS — E039 Hypothyroidism, unspecified: Secondary | ICD-10-CM | POA: Diagnosis not present

## 2016-12-29 DIAGNOSIS — Z1231 Encounter for screening mammogram for malignant neoplasm of breast: Secondary | ICD-10-CM | POA: Diagnosis not present

## 2016-12-29 DIAGNOSIS — Z1239 Encounter for other screening for malignant neoplasm of breast: Secondary | ICD-10-CM

## 2016-12-29 DIAGNOSIS — M25571 Pain in right ankle and joints of right foot: Secondary | ICD-10-CM | POA: Diagnosis not present

## 2016-12-29 DIAGNOSIS — N951 Menopausal and female climacteric states: Secondary | ICD-10-CM | POA: Diagnosis not present

## 2016-12-29 DIAGNOSIS — K589 Irritable bowel syndrome without diarrhea: Secondary | ICD-10-CM | POA: Insufficient documentation

## 2016-12-29 HISTORY — DX: Irritable bowel syndrome, unspecified: K58.9

## 2016-12-29 HISTORY — DX: Family history of malignant neoplasm of digestive organs: Z80.0

## 2016-12-29 LAB — CBC WITH DIFFERENTIAL/PLATELET
Basophils Absolute: 0.1 10*3/uL (ref 0.0–0.1)
Basophils Relative: 1.4 % (ref 0.0–3.0)
Eosinophils Absolute: 0.3 10*3/uL (ref 0.0–0.7)
Eosinophils Relative: 4.5 % (ref 0.0–5.0)
HCT: 42 % (ref 36.0–46.0)
Hemoglobin: 14.4 g/dL (ref 12.0–15.0)
Lymphocytes Relative: 27.2 % (ref 12.0–46.0)
Lymphs Abs: 1.9 10*3/uL (ref 0.7–4.0)
MCHC: 34.2 g/dL (ref 30.0–36.0)
MCV: 92.6 fl (ref 78.0–100.0)
Monocytes Absolute: 0.5 10*3/uL (ref 0.1–1.0)
Monocytes Relative: 7.3 % (ref 3.0–12.0)
Neutro Abs: 4.1 10*3/uL (ref 1.4–7.7)
Neutrophils Relative %: 59.6 % (ref 43.0–77.0)
Platelets: 328 10*3/uL (ref 150.0–400.0)
RBC: 4.54 Mil/uL (ref 3.87–5.11)
RDW: 13.3 % (ref 11.5–15.5)
WBC: 6.9 10*3/uL (ref 4.0–10.5)

## 2016-12-29 LAB — COMPREHENSIVE METABOLIC PANEL
ALT: 14 U/L (ref 0–35)
AST: 18 U/L (ref 0–37)
Albumin: 4.6 g/dL (ref 3.5–5.2)
Alkaline Phosphatase: 91 U/L (ref 39–117)
BUN: 15 mg/dL (ref 6–23)
CO2: 27 mEq/L (ref 19–32)
Calcium: 9.9 mg/dL (ref 8.4–10.5)
Chloride: 103 mEq/L (ref 96–112)
Creatinine, Ser: 0.67 mg/dL (ref 0.40–1.20)
GFR: 94.87 mL/min (ref >60.00)
Glucose, Bld: 99 mg/dL (ref 70–99)
Potassium: 4.5 mEq/L (ref 3.5–5.1)
Sodium: 138 mEq/L (ref 135–145)
Total Bilirubin: 0.4 mg/dL (ref 0.2–1.2)
Total Protein: 7.5 g/dL (ref 6.0–8.3)

## 2016-12-29 LAB — TSH: TSH: 0.87 u[IU]/mL (ref 0.35–4.50)

## 2016-12-29 LAB — T4, FREE: Free T4: 0.81 ng/dL (ref 0.60–1.60)

## 2016-12-29 LAB — URIC ACID: Uric Acid, Serum: 5 mg/dL (ref 2.4–7.0)

## 2016-12-29 MED ORDER — PREDNISONE 5 MG PO TABS: ORAL_TABLET | ORAL | 0 refills | Status: DC

## 2016-12-29 MED FILL — predniSONE 5 MG TABS: 5 | 12 days supply | Qty: 42 | Fill #0

## 2016-12-29 NOTE — Progress Notes (Signed)
Kara Kennedy is a 61 y.o. female is here to [facility].   Patient Care Team: Kara Deutscher, DO as PCP - General (Family Medicine)   History of Present Illness:   Kara Kennedy CMA acting as scribe for Dr. Juleen Kennedy.  Ankle Pain   There was no injury mechanism. The pain is present in the left ankle. The quality of the pain is described as aching. The pain is at a severity of 5/10. The pain is mild. The pain has been improving since onset. She reports no foreign bodies present. The symptoms are aggravated by movement and weight bearing. She has tried elevation for the symptoms. The treatment provided no relief.    Health Maintenance Due  Topic Date Due  . Hepatitis C Screening  1956/05/01  . HIV Screening  04/03/1970  . TETANUS/TDAP  04/03/1974  . MAMMOGRAM  04/03/2005  . COLONOSCOPY  04/03/2005  . PAP SMEAR  07/09/2015    There is no immunization history on file for this patient.  PMHx, SurgHx, SocialHx, Medications, and Allergies were reviewed in the Visit Navigator and updated as appropriate.   Past Medical History:  Diagnosis Date  . Allergic rhinitis 07/30/2012  . Anxiety 03/06/2014  . Chronic neck pain 09/06/2012  . Family history of colon cancer 12/29/2016  . History of pyelonephritis 07/30/2012  . Hypothyroidism 03/06/2014  . IBS (irritable bowel syndrome) 12/29/2016  . Insomnia 08/19/2011  . Post menopausal syndrome 03/06/2014   Past Surgical History:  Procedure Laterality Date  . CESAREAN SECTION    . GALLBLADDER SURGERY    . SPINE SURGERY     Family History  Problem Relation Age of Onset  . Cancer Mother   . Diabetes Father   . Heart disease Father   . Cancer Maternal Grandmother   . Diabetes Paternal Grandfather    Social History  Substance Use Topics  . Smoking status: Former Research scientist (life sciences)  . Smokeless tobacco: Never Used  . Alcohol use 8.4 oz/week    14 Glasses of wine per week   Current Medications and Allergies:   .  ALPRAZolam (XANAX) 0.5 MG tablet,  Take by mouth., Disp: , Rfl:  .  azelastine (ASTELIN) 0.1 % nasal spray, 2 sprays each nostril four times daily for 3-5 days then twice a day, Disp: , Rfl:  .  estrogens, conjugated, (PREMARIN) 0.625 MG tablet, Take by mouth., Disp: , Rfl:  .  FLUoxetine (PROZAC) 20 MG capsule, TAKE ONE CAPSULE BY MOUTH DAILY., Disp: , Rfl:  .  gabapentin (NEURONTIN) 300 MG capsule, Take by mouth., Disp: , Rfl:  .  levothyroxine (SYNTHROID) 88 MCG tablet, TAKE 1 TABLET BY MOUTH EVERY MORNING, Disp: , Rfl:  .  linaclotide (LINZESS) 290 MCG CAPS capsule, Take by mouth., Disp: , Rfl:  .  meloxicam (MOBIC) 15 MG tablet, TAKE 1 TABLET BY MOUTH EVERY DAY AS NEEDED FOR PAIN, Disp: , Rfl:  .  montelukast (SINGULAIR) 10 MG tablet, TAKE 1 TABLET BY MOUTH EVERY DAY, Disp: , Rfl:   Allergies  Allergen Reactions  . Sulfamethoxazole-Trimethoprim    Review of Systems:   Review of Systems  Constitutional: Negative for fever and malaise/fatigue.  HENT: Negative for ear pain, sinus pain and sore throat.   Eyes: Negative for blurred vision and double vision.  Respiratory: Negative for cough, shortness of breath and wheezing.   Cardiovascular: Negative for chest pain, palpitations and leg swelling.  Gastrointestinal: Negative for abdominal pain, nausea and vomiting.  Musculoskeletal: Positive for joint  pain. Negative for back pain and neck pain.       Left ankle.   Neurological: Negative for dizziness.  Psychiatric/Behavioral: Negative for depression, hallucinations and memory loss.    Vitals:   Vitals:   12/29/16 1011  BP: 116/66  Pulse: 72  Temp: 97.7 F (36.5 C)  TempSrc: Oral  SpO2: 97%  Weight: 145 lb 12.8 oz (66.1 kg)  Height: 5\' 3"  (1.6 m)     Body mass index is 25.83 kg/m.  Physical Exam:   Physical Exam  Constitutional: She appears well-developed and well-nourished. No distress.  HENT:  Head: Normocephalic and atraumatic.  Eyes: EOM are normal. Pupils are equal, round, and reactive to light.    Neck: Normal range of motion. Neck supple. No thyromegaly present.  Cardiovascular: Normal rate, regular rhythm, normal heart sounds and intact distal pulses.   Pulmonary/Chest: Effort normal.  Abdominal: Soft.  Musculoskeletal:       Feet:  Skin: Skin is warm.  Psychiatric: She has a normal mood and affect. Her behavior is normal.  Nursing note and vitals reviewed.   Results for orders placed or performed in visit on 12/29/16  CBC with Differential/Platelet  Result Value Ref Range   WBC 6.9 4.0 - 10.5 K/uL   RBC 4.54 3.87 - 5.11 Mil/uL   Hemoglobin 14.4 12.0 - 15.0 g/dL   HCT 42.0 36.0 - 46.0 %   MCV 92.6 78.0 - 100.0 fl   MCHC 34.2 30.0 - 36.0 g/dL   RDW 13.3 11.5 - 15.5 %   Platelets 328.0 150.0 - 400.0 K/uL   Neutrophils Relative % 59.6 43.0 - 77.0 %   Lymphocytes Relative 27.2 12.0 - 46.0 %   Monocytes Relative 7.3 3.0 - 12.0 %   Eosinophils Relative 4.5 0.0 - 5.0 %   Basophils Relative 1.4 0.0 - 3.0 %   Neutro Abs 4.1 1.4 - 7.7 K/uL   Lymphs Abs 1.9 0.7 - 4.0 K/uL   Monocytes Absolute 0.5 0.1 - 1.0 K/uL   Eosinophils Absolute 0.3 0.0 - 0.7 K/uL   Basophils Absolute 0.1 0.0 - 0.1 K/uL  Comprehensive metabolic panel  Result Value Ref Range   Sodium 138 135 - 145 mEq/L   Potassium 4.5 3.5 - 5.1 mEq/L   Chloride 103 96 - 112 mEq/L   CO2 27 19 - 32 mEq/L   Glucose, Bld 99 70 - 99 mg/dL   BUN 15 6 - 23 mg/dL   Creatinine, Ser 0.67 0.40 - 1.20 mg/dL   Total Bilirubin 0.4 0.2 - 1.2 mg/dL   Alkaline Phosphatase 91 39 - 117 U/L   AST 18 0 - 37 U/L   ALT 14 0 - 35 U/L   Total Protein 7.5 6.0 - 8.3 g/dL   Albumin 4.6 3.5 - 5.2 g/dL   Calcium 9.9 8.4 - 10.5 mg/dL   GFR 94.87 >60.00 mL/min  TSH  Result Value Ref Range   TSH 0.87 0.35 - 4.50 uIU/mL  T4, free  Result Value Ref Range   Free T4 0.81 0.60 - 1.60 ng/dL  Uric Acid  Result Value Ref Range   Uric Acid, Serum 5.0 2.4 - 7.0 mg/dL    Assessment and Plan:   Kara Kennedy was seen today for establish care and  ankle pain.  Diagnoses and all orders for this visit:  Acute right ankle pain Comments: Already improved. DDx includes gout v pseudogout. Prednisone as below. Uric acid WNL - does not exclude gout.  Orders: -  predniSONE (DELTASONE) 5 MG tablet; 6-6-5-5-4-4-3-3-2-2-1-1-0 -     CBC with Differential/Platelet -     Comprehensive metabolic panel -     Uric Acid  Hypothyroidism, unspecified type Comments: Well controlled.  No signs of complications, medication side effects, or red flags.  Continue current regimen.   Orders: -     TSH -     T4, free  Irritable bowel syndrome with constipation Comments: Doing well on Linzess.  Family history of colon cancer Comments: Colonoscopy q 5 years. Hx of polyps.     . Reviewed expectations re: course of current medical issues. . Discussed self-management of symptoms. . Outlined signs and symptoms indicating need for more acute intervention. . Patient verbalized understanding and all questions were answered. Marland Kitchen Health Maintenance issues including appropriate healthy diet, exercise, and smoking avoidance were discussed with patient. . See orders for this visit as documented in the electronic medical record. . Patient received an After Visit Summary.  CMA served as Education administrator during this visit. History, Physical, and Plan performed by medical provider. The above documentation has been reviewed and is accurate and complete. Kara Kennedy, D.O.   Kara Deutscher, DO [place] [place] [facility] 12/29/2016  No future appointments.

## 2016-12-30 MED ORDER — ESTROGENS CONJUGATED 0.625 MG PO TABS
0.6250 mg | ORAL_TABLET | Freq: Every day | ORAL | 3 refills | Status: DC
Start: 2016-12-30 — End: 2017-01-15

## 2016-12-30 MED FILL — PREMARIN 0.625 MG TABLET: 0.625 | 90 days supply | Qty: 90 | Fill #0

## 2016-12-30 MED FILL — SYNTHROID 88 MCG TABLET: 88 | 90 days supply | Qty: 90 | Fill #1

## 2016-12-30 MED FILL — LINZESS 290 MCG CAPSULE: 290 | 30 days supply | Qty: 30 | Fill #6

## 2016-12-30 NOTE — Addendum Note (Signed)
Addended by: Durwin Glaze on: 12/30/2016 09:25 AM   Modules accepted: Orders

## 2016-12-31 MED FILL — GABAPENTIN 300 MG CAPSULE: 300 | 30 days supply | Qty: 90 | Fill #0

## 2016-12-31 NOTE — Addendum Note (Signed)
Addended by: Durwin Glaze on: 12/31/2016 08:22 AM   Modules accepted: Orders

## 2017-01-02 ENCOUNTER — Telehealth: Payer: Self-pay | Admitting: Sports Medicine

## 2017-01-02 ENCOUNTER — Encounter: Payer: Self-pay | Admitting: Sports Medicine

## 2017-01-02 ENCOUNTER — Ambulatory Visit (INDEPENDENT_AMBULATORY_CARE_PROVIDER_SITE_OTHER): Payer: 59

## 2017-01-02 ENCOUNTER — Ambulatory Visit (INDEPENDENT_AMBULATORY_CARE_PROVIDER_SITE_OTHER): Payer: 59 | Admitting: Sports Medicine

## 2017-01-02 ENCOUNTER — Ambulatory Visit: Payer: Self-pay

## 2017-01-02 VITALS — BP 122/80 | HR 73 | Ht 63.0 in | Wt 146.0 lb

## 2017-01-02 DIAGNOSIS — M25571 Pain in right ankle and joints of right foot: Secondary | ICD-10-CM | POA: Diagnosis not present

## 2017-01-02 NOTE — Telephone Encounter (Signed)
Called patient to see if they could come in at 3:30 if the slot was still available for Dr. [name] today. I did not leave a message.

## 2017-01-02 NOTE — Progress Notes (Signed)
OFFICE VISIT NOTE Kara Kennedy. Kara Kennedy, [place] [facility] [place]  Kara Kennedy - 61 y.o. female MRN [id number]  Date of birth: 06/30/1956  Visit Date: 01/02/2017  PCP: [name], DO   Referred by: [name], DO  [name], CMA acting as scribe for Dr. [name].  SUBJECTIVE:   Chief Complaint  Patient presents with  . right ankle pain   HPI: As below and per problem based documentation when appropriate.  Pt presents today with complaint of right ankle pain. She has noticed some mild swelling and the ankle to tender to palpation.  Pain started about 1 week ago.  Pain is described as constant aching pain. Initially the pain would come and go but now it is constant. Pt also describes the pain as a "tight" sensation. Pain is rated 7/10 today.  Pain is worsened when leg is dangling and improves when walking. She tried to keep the leg elevated but still feels no relief. Pain is worse when going up and down stairs, down worse than up.  She has been taking Prednisone and Ibuprofen with minimal relief.  Pt reports occasional pain in the right calf and lateral aspect of the right foot.   Pt denies fever, chills, night sweats.     Review of Systems  Constitutional: Negative for chills and fever.  Respiratory: Negative for shortness of breath and wheezing.   Cardiovascular: Negative for chest pain, palpitations and leg swelling.  Musculoskeletal: Negative for falls.  Neurological: Negative for dizziness, tingling and headaches.  Endo/Heme/Allergies: Does not bruise/bleed easily.    Otherwise per HPI.  HISTORY & PERTINENT PRIOR DATA:  No specialty comments available. She reports that she has quit smoking. She has never used smokeless tobacco.   Recent Labs  12/29/16 1054  LABURIC 5.0   Medications & Allergies reviewed per EMR Patient Active Problem List   Diagnosis Date Noted  . Acute right ankle pain 01/02/2017  .  IBS (irritable bowel syndrome) 12/29/2016  . Family history of colon cancer 12/29/2016  . Anxiety 03/06/2014  . Hypothyroidism 03/06/2014  . Post menopausal syndrome 03/06/2014  . Chronic neck pain 09/06/2012  . Allergic rhinitis 07/30/2012  . History of pyelonephritis 07/30/2012  . Insomnia 08/19/2011   Past Medical History:  Diagnosis Date  . Allergic rhinitis 07/30/2012  . Anxiety 03/06/2014  . Chronic neck pain 09/06/2012  . Family history of colon cancer 12/29/2016  . History of pyelonephritis 07/30/2012  . Hypothyroidism 03/06/2014  . IBS (irritable bowel syndrome) 12/29/2016  . Insomnia 08/19/2011  . Post menopausal syndrome 03/06/2014   Family History  Problem Relation Age of Onset  . Cancer Mother   . Diabetes Father   . Heart disease Father   . Cancer Maternal Grandmother   . Diabetes Paternal Grandfather    Past Surgical History:  Procedure Laterality Date  . CESAREAN SECTION    . GALLBLADDER SURGERY    . SPINE SURGERY     Social History   Occupational History  . Not on file.   Social History Main Topics  . Smoking status: Former Research scientist (life sciences)  . Smokeless tobacco: Never Used  . Alcohol use 8.4 oz/week    14 Glasses of wine per week  . Drug use: No  . Sexual activity: Yes    OBJECTIVE:  VS:  HT:5\' 3"  (160 cm)   WT:146 lb (66.2 kg)  BMI:25.9    BP:122/80  HR:73bpm  TEMP: ( )  RESP:98 % EXAM: Findings:  WDWN, NAD, Non-toxic appearing Alert & appropriately interactive Not depressed or anxious appearing No increased work of breathing. Pupils are equal. EOM intact without nystagmus No clubbing or cyanosis of the extremities appreciated No significant rashes/lesions/ulcerations overlying the examined area. DP & PT pulses 2+/4.  No significant pretibial edema. Sensation intact to light touch in lower extremities.  Foot & Ankle: Foot and ankle are overall well aligned she does have a moderate degree of swelling across the entire dorsum of the ankle as well as  posteriorly.  She has pain with any type of active or passive range of motion of the ankle joint and subtalar joint.  No significant TTP over the base of the 5th metatarsal,or  navicular,  No significant pain or discomfort with isolated passive forefoot abduction. Marked pain with any type of ankle muscle strength testing. Stable to ankle drawer and talar tilt however painful.  Negative cotton test No significant pain with calcaneal squeeze      Dg Ankle 2 Views Right  Result Date: 01/02/2017 CLINICAL DATA:  Acute right ankle pain EXAM: RIGHT ANKLE - 2 VIEW COMPARISON:  None. FINDINGS: There is no evidence of fracture, dislocation, or joint effusion. There is no evidence of arthropathy or other focal bone abnormality. Soft tissues are unremarkable. IMPRESSION: Negative. Electronically Signed   By: Jerilynn Mages.  Shick M.D.   On: 01/02/2017 17:04   US Guided Needle Placement(no Linked Charges)  Result Date: 01/09/2017 Please see Notes or Procedures tab for imaging impression.  ASSESSMENT & PLAN:   Problem List Items Addressed This Visit    Acute right ankle pain - Primary    PROCEDURE NOTE - ULTRASOUND GUIDED ASPIRATION & INJECTION: Right Ankle Images were obtained and interpreted by myself, Teresa Coombs, DO  Images have been saved and stored to PACS system. Images obtained on: GE S7 Ultrasound machine  ULTRASOUND FINDINGS: Fairly large cystic structure over the anterior aspect of the ankle joint consistent with a ankle effusion versus ganglion cyst.  Aspiration did reveal that this was more gelatinous material consistent with a ganglion cyst likely associated with the extensor tendons.  DESCRIPTION OF PROCEDURE:  The patient's clinical condition is marked by substantial pain and/or significant functional disability. Other conservative therapy has not provided relief, is contraindicated, or not appropriate. There is a reasonable likelihood that injection will significantly improve the patient's  pain and/or functional impairment. After discussing the risks, benefits and expected outcomes of the injection and all questions were reviewed and answered, the patient wished to undergo the above named procedure. Verbal consent was obtained. The ultrasound was used to identify the target structure and adjacent neurovascular structures. The skin was then prepped in sterile fashion and the target structure was injected under direct visualization using sterile technique as below: PREP: Alcohol, Ethel Chloride, 1 cc of 1% lidocaine on a 25-gauge needle APPROACH: Anterior lateral, stopcock technique, 21g 2" needle INJECTATE: 1cc 0.5% marcaine, 1 cc 40mg  DepoMedrol ASPIRATE: 2 cc of thick gelatinous fluid, sent for cell count and crystal analysis DRESSING: Band-Aid small full ankle Body Helix  Post procedural instructions including recommending icing and warning signs for infection were reviewed. This procedure was well tolerated and there were no complications.   IMPRESSION: Succesful US Guided Aspiration & injection       Relevant Orders   DG Ankle 2 Views Right (Completed)   US GUIDED NEEDLE PLACEMENT(NO LINKED CHARGES) (Completed)   Synovial fluid, crystal (Completed)      Follow-up: No  Follow-up on file.   CMA/ATC served as Education administrator during this visit. History, Physical, and Plan performed by medical provider. Documentation and orders reviewed and attested to.      Teresa Coombs, [facility] Physician

## 2017-01-02 NOTE — Assessment & Plan Note (Signed)
PROCEDURE NOTE - ULTRASOUND GUIDED ASPIRATION & INJECTION: Right Ankle Images were obtained and interpreted by myself, Teresa Coombs, DO  Images have been saved and stored to PACS system. Images obtained on: GE S7 Ultrasound machine  ULTRASOUND FINDINGS: Fairly large cystic structure over the anterior aspect of the ankle joint consistent with a ankle effusion versus ganglion cyst.  Aspiration did reveal that this was more gelatinous material consistent with a ganglion cyst likely associated with the extensor tendons.  DESCRIPTION OF PROCEDURE:  The patient's clinical condition is marked by substantial pain and/or significant functional disability. Other conservative therapy has not provided relief, is contraindicated, or not appropriate. There is a reasonable likelihood that injection will significantly improve the patient's pain and/or functional impairment. After discussing the risks, benefits and expected outcomes of the injection and all questions were reviewed and answered, the patient wished to undergo the above named procedure. Verbal consent was obtained. The ultrasound was used to identify the target structure and adjacent neurovascular structures. The skin was then prepped in sterile fashion and the target structure was injected under direct visualization using sterile technique as below: PREP: Alcohol, Ethel Chloride, 1 cc of 1% lidocaine on a 25-gauge needle APPROACH: Anterior lateral, stopcock technique, 21g 2" needle INJECTATE: 1cc 0.5% marcaine, 1 cc 40mg  DepoMedrol ASPIRATE: 2 cc of thick gelatinous fluid, sent for cell count and crystal analysis DRESSING: Band-Aid small full ankle Body Helix  Post procedural instructions including recommending icing and warning signs for infection were reviewed. This procedure was well tolerated and there were no complications.   IMPRESSION: Succesful US Guided Aspiration & injection

## 2017-01-03 LAB — SYNOVIAL FLUID, CRYSTAL

## 2017-01-05 MED ORDER — HYDROCODONE-ACETAMINOPHEN 5-325 MG PO TABS: 1.0000 | ORAL_TABLET | Freq: Four times a day (QID) | ORAL | 0 refills | Status: DC | PRN

## 2017-01-05 MED ORDER — HYDROCODONE-ACETAMINOPHEN 10-325 MG PO TABS: 1.0000 | ORAL_TABLET | Freq: Three times a day (TID) | ORAL | 0 refills | Status: DC | PRN

## 2017-01-05 MED FILL — HYDROCODON-APAP 5-325: 5-325 | 5 days supply | Qty: 20 | Fill #0

## 2017-01-05 NOTE — Addendum Note (Signed)
Addended by: [name] R on: 01/05/2017 10:04 AM   Modules accepted: Orders

## 2017-01-05 NOTE — Addendum Note (Signed)
Addended by: [name] R on: 01/05/2017 10:01 AM   Modules accepted: Orders

## 2017-01-07 ENCOUNTER — Telehealth: Payer: Self-pay | Admitting: Sports Medicine

## 2017-01-07 DIAGNOSIS — M25571 Pain in right ankle and joints of right foot: Secondary | ICD-10-CM

## 2017-01-07 MED ORDER — COLCHICINE 0.6 MG PO CAPS: 1.0000 | ORAL_CAPSULE | Freq: Two times a day (BID) | ORAL | 1 refills | Status: DC | PRN

## 2017-01-07 MED ORDER — TRAMADOL HCL 50 MG PO TABS: 50.0000 mg | ORAL_TABLET | Freq: Four times a day (QID) | ORAL | 0 refills | Status: DC | PRN

## 2017-01-07 MED ORDER — COLCHICINE 0.6 MG PO CAPS: 1.0000 | ORAL_CAPSULE | Freq: Two times a day (BID) | ORAL | 0 refills | Status: DC | PRN

## 2017-01-07 MED FILL — COLCHICINE 0.6 MG CAPSULE: 0.6 | 30 days supply | Qty: 60 | Fill #0

## 2017-01-07 MED FILL — traMADol HCL 50 MG TABS: 50 | 15 days supply | Qty: 60 | Fill #0

## 2017-01-07 NOTE — Telephone Encounter (Signed)
Discussed the results of her fluid analysis.  This is positive for CPPD crystals.  We will start her on Medicare since she is continuing to have pain and have sent a prescription for tramadol.  This was discussed with her daughter Kara Kennedy today who will communicate this to the patient.

## 2017-01-07 NOTE — Assessment & Plan Note (Signed)
Colchicine  & Tramadol called in. Sample provided

## 2017-01-12 ENCOUNTER — Ambulatory Visit (INDEPENDENT_AMBULATORY_CARE_PROVIDER_SITE_OTHER): Payer: 59 | Admitting: Sports Medicine

## 2017-01-12 ENCOUNTER — Encounter: Payer: Self-pay | Admitting: Sports Medicine

## 2017-01-12 DIAGNOSIS — G8929 Other chronic pain: Secondary | ICD-10-CM | POA: Diagnosis not present

## 2017-01-12 DIAGNOSIS — M25571 Pain in right ankle and joints of right foot: Secondary | ICD-10-CM | POA: Diagnosis not present

## 2017-01-12 NOTE — Patient Instructions (Signed)
Follow Up after MRI.

## 2017-01-12 NOTE — Assessment & Plan Note (Signed)
Persistent significant posterior ankle pain following injection last week intra-articularly with positive CPPD crystals.  We will place him into a cam walker boot and get her set up for an MRI given the persistent severe pain.  Possibility of the potential calcaneal stress fracture although no eliciting mechanism identified.

## 2017-01-12 NOTE — Progress Notes (Signed)
OFFICE VISIT NOTE [name]. [place], [place] [facility] [place]  GAYLON MELCHOR - 61 y.o. female MRN [id number]  Date of birth: [birth year]  Visit Date: 01/12/2017  PCP: [name], DO   Referred by: [name], DO  Autumn McNeil, cma  acting as scribe for Dr. [name].  SUBJECTIVE:   Chief Complaint  Patient presents with  . Follow-up  . Right Ankle Pain   HPI: As below and per problem based documentation when appropriate.   Shameria reports with persistent Right Ankle Pain--back/medial side of foot. Steriod injection only the say of shot. Swelling has improved. Normal xray results. There is pain at rest and with movement. It is very hard for her to find a position that gives her relief. No exercise routine at this time. She is wear a body helix support daily except at bedtime. She is taking Tramadol during the day, Hydrocodone at night with minimal relief. Synovial fluid evaluation did show pseudogout. Taking Colchicine 0.6 daily.    Review of Systems  Constitutional: Positive for weight loss. Negative for chills, fever and malaise/fatigue.  HENT: Negative.   Eyes: Negative.   Respiratory: Negative.   Cardiovascular: Negative.   Gastrointestinal: Negative.   Genitourinary: Negative.   Musculoskeletal: Positive for joint pain and myalgias.  Skin: Negative for itching and rash.  Neurological: Positive for weakness. Negative for dizziness and headaches.  Endo/Heme/Allergies: Negative for environmental allergies and polydipsia. Does not bruise/bleed easily.  Psychiatric/Behavioral: Negative.     Otherwise per HPI.  HISTORY & PERTINENT PRIOR DATA:  No specialty comments available. She reports that she has quit smoking. She has never used smokeless tobacco.   Recent Labs  12/29/16 1054  LABURIC 5.0   Medications & Allergies reviewed per EMR Patient Active Problem List   Diagnosis Date Noted  . Right ankle pain  01/02/2017  . IBS (irritable bowel syndrome) 12/29/2016  . Family history of colon cancer 12/29/2016  . Anxiety 03/06/2014  . Hypothyroidism 03/06/2014  . Post menopausal syndrome 03/06/2014  . Chronic neck pain 09/06/2012  . Allergic rhinitis 07/30/2012  . History of pyelonephritis 07/30/2012  . Insomnia 08/19/2011   Past Medical History:  Diagnosis Date  . Allergic rhinitis 07/30/2012  . Anxiety 03/06/2014  . Chronic neck pain 09/06/2012  . Endometriosis   . Family history of colon cancer 12/29/2016  . History of pyelonephritis 07/30/2012  . Hypothyroidism 03/06/2014  . IBS (irritable bowel syndrome) 12/29/2016  . Insomnia 08/19/2011  . Post menopausal syndrome 03/06/2014   Family History  Problem Relation Age of Onset  . Cancer Mother   . Colon cancer Mother   . Diabetes Father   . Heart disease Father   . Cancer Maternal Grandmother   . Diabetes Paternal Grandfather    Past Surgical History:  Procedure Laterality Date  . ABDOMINAL HYSTERECTOMY    . CESAREAN SECTION    . DILATION AND CURETTAGE OF UTERUS    . GALLBLADDER SURGERY    . SPINE SURGERY     Social History   Occupational History  . Not on file.   Social History Main Topics  . Smoking status: Former Research scientist (life sciences)  . Smokeless tobacco: Never Used  . Alcohol use 4.2 oz/week    7 Glasses of wine per week  . Drug use: No  . Sexual activity: Yes    Partners: Male    Birth control/ protection: Surgical    OBJECTIVE:  VS:  HT:  WT:   BMI:     BP:   HR: bpm  TEMP: ( )  RESP:  EXAM: Findings:  Persistent marked swelling and pain at the ankle joint.  Marked pain with subtalar motion.  DP PT pulses 2+/4.  Ankle effusion is present.     Dg Ankle 2 Views Right  Result Date: 01/02/2017 CLINICAL DATA:  Acute right ankle pain EXAM: RIGHT ANKLE - 2 VIEW COMPARISON:  None. FINDINGS: There is no evidence of fracture, dislocation, or joint effusion. There is no evidence of arthropathy or other focal bone abnormality. Soft  tissues are unremarkable. IMPRESSION: Negative. Electronically Signed   By: Jerilynn Mages.  Shick M.D.   On: 01/02/2017 17:04   US Guided Needle Placement(no Linked Charges)  Result Date: 01/09/2017 Please see Notes or Procedures tab for imaging impression.  ASSESSMENT & PLAN:   Problem List Items Addressed This Visit    Right ankle pain    Persistent significant posterior ankle pain following injection last week intra-articularly with positive CPPD crystals.  We will place him into a cam walker boot and get her set up for an MRI given the persistent severe pain.  Possibility of the potential calcaneal stress fracture although no eliciting mechanism identified.      Relevant Orders   MR ANKLE RIGHT WO CONTRAST (Completed)    Other Visit Diagnoses    Chronic pain of right ankle    -  Primary   Relevant Orders   MR ANKLE RIGHT WO CONTRAST (Completed)      Follow-up: Return for MRI review.   CMA/ATC served as Education administrator during this visit. History, Physical, and Plan performed by medical provider. Documentation and orders reviewed and attested to.      Teresa Coombs, [place] Sports [facility] Physician

## 2017-01-15 ENCOUNTER — Encounter: Payer: Self-pay | Admitting: Obstetrics and Gynecology

## 2017-01-15 ENCOUNTER — Ambulatory Visit (INDEPENDENT_AMBULATORY_CARE_PROVIDER_SITE_OTHER): Payer: 59 | Admitting: Obstetrics and Gynecology

## 2017-01-15 VITALS — BP 118/76 | HR 68 | Resp 16 | Ht 62.5 in | Wt 144.0 lb

## 2017-01-15 DIAGNOSIS — Z79890 Hormone replacement therapy (postmenopausal): Secondary | ICD-10-CM | POA: Diagnosis not present

## 2017-01-15 DIAGNOSIS — Z01419 Encounter for gynecological examination (general) (routine) without abnormal findings: Secondary | ICD-10-CM | POA: Diagnosis not present

## 2017-01-15 MED ORDER — ESTROGENS CONJUGATED 0.45 MG PO TABS
0.4500 mg | ORAL_TABLET | Freq: Every day | ORAL | 3 refills | Status: DC
Start: 2017-01-15 — End: 2017-08-24

## 2017-01-15 MED FILL — PREMARIN 0.45 MG TABLET: 0.45 | 84 days supply | Qty: 63 | Fill #0

## 2017-01-15 NOTE — Progress Notes (Signed)
61 y.o. [id number] MarriedCaucasianF here for annual exam.  H/O TAH, BSO. No vaginal bleeding. Sexually active, no pain. On ERT, no vasomotor symptoms. Went down from 0.9-0.625 2 years ago, willing to try go down some more.    No LMP recorded. Patient has had a hysterectomy.          Sexually active: Yes.    The current method of family planning is status post hysterectomy.    Exercising: Yes.    walking Smoker:  Former   Health Maintenance: Pap: 2016 WNL per patient   07-08-12 WNL History of abnormal Pap:  no MMG:  2014 WNL per patient, she states she did her mammogram in 2016 at [facility] office, not in the system Colonoscopy:  2014 WNL per patient  BMD:   2016 WNL per patient  TDaP:  03-06-14 Gardasil: N/A   reports that she has quit smoking. She has never used smokeless tobacco. She reports that she drinks about 4.2 oz of alcohol per week . She reports that she does not use drugs. 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 She has 2 children, one grandchild. Oldest daughter and grandson are local. Yolanda Bonine is at [employer]. Youngest daughter is 95 and lives in [place], [place] think she will have kids.   Past Medical History:  Diagnosis Date  . Allergic rhinitis 07/30/2012  . Anxiety 03/06/2014  . Chronic neck pain 09/06/2012  . Endometriosis   . Family history of colon cancer 12/29/2016  . History of pyelonephritis 07/30/2012  . Hypothyroidism 03/06/2014  . IBS (irritable bowel syndrome) 12/29/2016  . Insomnia 08/19/2011  . Post menopausal syndrome 03/06/2014    Past Surgical History:  Procedure Laterality Date  . ABDOMINAL HYSTERECTOMY    . CESAREAN SECTION    . DILATION AND CURETTAGE OF UTERUS    . GALLBLADDER SURGERY    . SPINE SURGERY    c/S x 1. First child was breech vaginal delivery at 17 weeks.   Current Outpatient Prescriptions  Medication Sig Dispense Refill  . ALPRAZolam (XANAX) 0.5 MG tablet Take by mouth.    . Colchicine (MITIGARE) 0.6 MG CAPS Take  1 capsule by mouth 2 (two) times daily as needed. 15 capsule 0  . docusate sodium (COLACE) 100 MG capsule Take 100 mg by mouth 2 (two) times daily.    Marland Kitchen estrogens, conjugated, (PREMARIN) 0.625 MG tablet Take 1 tablet (0.625 mg total) by mouth daily. 90 tablet 3  . FLUoxetine (PROZAC) 20 MG capsule TAKE ONE CAPSULE BY MOUTH DAILY.    Marland Kitchen gabapentin (NEURONTIN) 300 MG capsule Take by mouth.    . levothyroxine (SYNTHROID) 88 MCG tablet TAKE 1 TABLET BY MOUTH EVERY MORNING    . linaclotide (LINZESS) 290 MCG CAPS capsule Take by mouth.    . meloxicam (MOBIC) 15 MG tablet TAKE 1 TABLET BY MOUTH EVERY DAY AS NEEDED FOR PAIN    . montelukast (SINGULAIR) 10 MG tablet TAKE 1 TABLET BY MOUTH EVERY DAY    . traMADol (ULTRAM) 50 MG tablet Take 1 tablet (50 mg total) by mouth every 6 (six) hours as needed for moderate pain. (Patient not taking: Reported on 01/15/2017) 60 tablet 0   No current facility-administered medications for this visit.     Family History  Problem Relation Age of Onset  . Cancer Mother   . Colon cancer Mother   . Diabetes Father   . Heart disease Father   . Cancer Maternal Grandmother   .  Diabetes Paternal Grandfather     Review of Systems  Constitutional: Negative.   HENT: Negative.   Eyes: Negative.   Respiratory: Negative.   Cardiovascular: Negative.   Gastrointestinal: Negative.   Endocrine: Negative.   Genitourinary: Negative.   Musculoskeletal: Negative.   Skin: Negative.   Allergic/Immunologic: Negative.   Neurological: Negative.   Psychiatric/Behavioral: Negative.     Exam:   BP 118/76 (BP Location: Right Arm, Patient Position: Sitting, Cuff Size: Normal)   Pulse 68   Resp 16   Ht 5' 2.5" (1.588 m)   Wt 144 lb (65.3 kg)   BMI 25.92 kg/m   Weight change: @WEIGHTCHANGE @ Height:   Height: 5' 2.5" (158.8 cm)  Ht Readings from Last 3 Encounters:  01/15/17 5' 2.5" (1.588 m)  01/02/17 5\' 3"  (1.6 m)  12/29/16 5\' 3"  (1.6 m)    General appearance: alert,  cooperative and appears stated age Head: Normocephalic, without obvious abnormality, atraumatic Neck: no adenopathy, supple, symmetrical, trachea midline and thyroid normal to inspection and palpation Lungs: clear to auscultation bilaterally Cardiovascular: regular rate and rhythm Breasts: normal appearance, no masses or tenderness Abdomen: soft, non-tender; bowel sounds normal; no masses,  no organomegaly Extremities: extremities normal, atraumatic, no cyanosis or edema Skin: Skin color, texture, turgor normal. No rashes or lesions Lymph nodes: Cervical, supraclavicular, and axillary nodes normal. No abnormal inguinal nodes palpated Neurologic: Grossly normal   Pelvic: External genitalia:  no lesions              Urethra:  normal appearing urethra with no masses, tenderness or lesions              Bartholins and Skenes: normal                 Vagina: normal appearing vagina with normal color and discharge, no lesions              Cervix: absent               Bimanual Exam:  Uterus:  uterus absent              Adnexa: no mass, fullness, tenderness               Rectovaginal: Confirms               Anus:  normal sphincter tone, no lesions  Chaperone was present for exam.  A:  Well Woman with normal exam  H/O hysterectomy  ERT, aware of risks, WHI information given   P:   Screening labs with primary MD  Mammogram  Try lower dose of premarin 0.45 mg a day, she will let me know if she is having problems  No pap needed  Discussed breast self exam  Discussed calcium and vit D intake

## 2017-01-15 NOTE — Patient Instructions (Signed)

## 2017-01-19 ENCOUNTER — Ambulatory Visit
Admission: RE | Admit: 2017-01-19 | Discharge: 2017-01-19 | Disposition: A | Discharge status: Home / Self Care | Payer: 59 | Source: Ambulatory Visit | Attending: Sports Medicine | Admitting: Sports Medicine

## 2017-01-19 DIAGNOSIS — G8929 Other chronic pain: Secondary | ICD-10-CM

## 2017-01-19 DIAGNOSIS — M25571 Pain in right ankle and joints of right foot: Principal | ICD-10-CM

## 2017-01-19 DIAGNOSIS — M7989 Other specified soft tissue disorders: Secondary | ICD-10-CM | POA: Diagnosis not present

## 2017-01-20 ENCOUNTER — Other Ambulatory Visit: Payer: Self-pay | Admitting: Family Medicine

## 2017-01-20 ENCOUNTER — Other Ambulatory Visit: Payer: Self-pay | Admitting: *Deleted

## 2017-01-20 DIAGNOSIS — M84371A Stress fracture, right ankle, initial encounter for fracture: Secondary | ICD-10-CM

## 2017-01-20 DIAGNOSIS — M25571 Pain in right ankle and joints of right foot: Secondary | ICD-10-CM

## 2017-01-20 NOTE — Telephone Encounter (Signed)
Order for DME Knee Scooter faxed to [facility].

## 2017-01-23 ENCOUNTER — Other Ambulatory Visit: Payer: 59

## 2017-01-23 ENCOUNTER — Other Ambulatory Visit: Payer: Self-pay | Admitting: Family Medicine

## 2017-01-23 MED ORDER — HYDROCODONE-ACETAMINOPHEN 5-325 MG PO TABS: 1.0000 | ORAL_TABLET | Freq: Four times a day (QID) | ORAL | 0 refills | Status: DC | PRN

## 2017-01-23 MED FILL — LINZESS 290 MCG CAPSULE: 290 | 30 days supply | Qty: 30 | Fill #7

## 2017-01-23 MED FILL — HYDROCODON-APAP 5-325: 5-325 | 7 days supply | Qty: 30 | Fill #0

## 2017-01-24 ENCOUNTER — Other Ambulatory Visit: Payer: 59

## 2017-01-29 MED FILL — GABAPENTIN 300 MG CAPSULE: 300 | 30 days supply | Qty: 90 | Fill #1

## 2017-02-02 ENCOUNTER — Encounter: Payer: Self-pay | Admitting: Sports Medicine

## 2017-02-02 ENCOUNTER — Ambulatory Visit (INDEPENDENT_AMBULATORY_CARE_PROVIDER_SITE_OTHER): Payer: 59 | Admitting: Sports Medicine

## 2017-02-02 VITALS — BP 122/74 | HR 74 | Ht 62.5 in | Wt 143.0 lb

## 2017-02-02 DIAGNOSIS — M25571 Pain in right ankle and joints of right foot: Secondary | ICD-10-CM

## 2017-02-02 DIAGNOSIS — M84363A Stress fracture, right fibula, initial encounter for fracture: Secondary | ICD-10-CM | POA: Diagnosis not present

## 2017-02-02 LAB — CBC WITH DIFFERENTIAL/PLATELET
BASOS PCT: 1.9 % (ref 0.0–3.0)
Basophils Absolute: 0.1 10*3/uL (ref 0.0–0.1)
EOS PCT: 4.7 % (ref 0.0–5.0)
Eosinophils Absolute: 0.3 10*3/uL (ref 0.0–0.7)
HCT: 41 % (ref 36.0–46.0)
HEMOGLOBIN: 13.7 g/dL (ref 12.0–15.0)
Lymphocytes Relative: 21.3 % (ref 12.0–46.0)
Lymphs Abs: 1.5 10*3/uL (ref 0.7–4.0)
MCHC: 33.4 g/dL (ref 30.0–36.0)
MCV: 93.2 fl (ref 78.0–100.0)
MONOS PCT: 7.2 % (ref 3.0–12.0)
Monocytes Absolute: 0.5 10*3/uL (ref 0.1–1.0)
Neutro Abs: 4.5 10*3/uL (ref 1.4–7.7)
Neutrophils Relative %: 64.9 % (ref 43.0–77.0)
Platelets: 310 10*3/uL (ref 150.0–400.0)
RBC: 4.39 Mil/uL (ref 3.87–5.11)
RDW: 12.7 % (ref 11.5–15.5)
WBC: 6.9 10*3/uL (ref 4.0–10.5)

## 2017-02-02 LAB — COMPREHENSIVE METABOLIC PANEL
ALBUMIN: 4.2 g/dL (ref 3.5–5.2)
ALK PHOS: 75 U/L (ref 39–117)
ALT: 15 U/L (ref 0–35)
AST: 18 U/L (ref 0–37)
BUN: 16 mg/dL (ref 6–23)
CHLORIDE: 103 meq/L (ref 96–112)
CO2: 27 mEq/L (ref 19–32)
Calcium: 9.6 mg/dL (ref 8.4–10.5)
Creatinine, Ser: 0.76 mg/dL (ref 0.40–1.20)
GFR: 82 mL/min (ref >60.00)
Glucose, Bld: 99 mg/dL (ref 70–99)
POTASSIUM: 4.8 meq/L (ref 3.5–5.1)
Sodium: 138 mEq/L (ref 135–145)
TOTAL PROTEIN: 6.9 g/dL (ref 6.0–8.3)
Total Bilirubin: 0.3 mg/dL (ref 0.2–1.2)

## 2017-02-02 LAB — VITAMIN D 25 HYDROXY (VIT D DEFICIENCY, FRACTURES): VITD: 25.19 ng/mL — ABNORMAL LOW (ref 30.00–100.00)

## 2017-02-02 NOTE — Patient Instructions (Signed)
Look into having your insurance company cover a set of custom orthotics.  The code is L3030 and there are 2 units.  You can call them  and ask if this is covered.  I am happy to do these for you at any time, you just need to let our front office schedulers know you would like an "orthotic appointment."  

## 2017-02-02 NOTE — Assessment & Plan Note (Signed)
+   for CPPD Stress fracture is present on MRI with surrounding soft tissue edema and mild thickening of the superior medial spring ligament with likely posterior tibialis insufficiency.  We will need to remain in the boot while weightbearing for an additional 2 weeks.  Okay to come out of and use Body Helix compression when nonweightbearing.  Okay to work on gentle range of motion and proprioceptive exercises.  Follow-up in 2 weeks and will need custom cushion orthotics to correct her underlying biomechanical issues.  Should begin working on posterior tibialis strengthening exercises after follow-up

## 2017-02-02 NOTE — Assessment & Plan Note (Signed)
We will need to fully evaluate gait once less symptomatic for fitting of custom cushioned insoles

## 2017-02-02 NOTE — Progress Notes (Signed)
OFFICE VISIT NOTE Kara Kennedy. [place], [name] at [place]  Kara Kennedy - 61 y.o. female MRN [id number]  Date of birth: 07-Jun-1956  Visit Date: 02/02/2017  PCP: [name], DO   Referred by: [name], DO  [name], CMA acting as scribe for Dr. [name].  SUBJECTIVE:   Chief Complaint  Patient presents with  . Follow-up    right foot pain   HPI: As below and per problem based documentation when appropriate.  Pt presents today in follow-up of MRI of the rt ankle which was done 01/19/17. Results are as follows: IMPRESSION: 1. The dominant finding is abnormal marrow edema and cortical irregularity in the distal fibula metadiaphysis suspicious for stress fracture. Surrounding soft tissue edema noted. 2. There is some indistinctness of the anterior inferior tibiofibular ligament but I suspect that this is probably due to the soft tissue edema from the bony abnormality rather than a true syndesmotic injury. 3. Distal tibialis posterior tenosynovitis and mild tendinopathy, correlate clinically in assessing for tibialis posterior dysfunction. There is also some mild thickening of the superomedial component of the spring ligament. 4. Mild common peroneus tendon sheath tenosynovitis. 5. Trace tibiotalar joint effusion with mild degenerative chondral Thinning.  Pt reports some improvement in sx. She hasn't worn her boot in 2 days because wearing it threw her back out. She hasn't noticed any improvement when taking Colchicine. She is still taking Tramadol during the day and Hydrocodone at bedtime. She has been wearing her Body Helix when she doesn't have the boot on. She feels like she can ambulate well while wearing the Body Helix but feels more stable with the boot. She reports that her ankle just constantly aches. The swelling has gone down.  Pt denies fever, chills, night sweats.     Review of Systems    Constitutional: Negative for chills and fever.  Respiratory: Negative for shortness of breath and wheezing.   Cardiovascular: Negative for chest pain, palpitations and leg swelling.  Musculoskeletal: Negative for falls.  Neurological: Negative for dizziness, tingling and headaches.  Endo/Heme/Allergies: Does not bruise/bleed easily.    Otherwise per HPI.  HISTORY & PERTINENT PRIOR DATA:  No specialty comments available. She reports that she has quit smoking. She has never used smokeless tobacco.   Recent Labs  12/29/16 1054  LABURIC 5.0   Medications & Allergies reviewed per EMR Patient Active Problem List   Diagnosis Date Noted  . Stress fracture of right fibula 02/02/2017  . Right ankle pain 01/02/2017  . IBS (irritable bowel syndrome) 12/29/2016  . Family history of colon cancer 12/29/2016  . Anxiety 03/06/2014  . Hypothyroidism 03/06/2014  . Post menopausal syndrome 03/06/2014  . Chronic neck pain 09/06/2012  . Allergic rhinitis 07/30/2012  . History of pyelonephritis 07/30/2012  . Insomnia 08/19/2011   Past Medical History:  Diagnosis Date  . Allergic rhinitis 07/30/2012  . Anxiety 03/06/2014  . Chronic neck pain 09/06/2012  . Endometriosis   . Family history of colon cancer 12/29/2016  . History of pyelonephritis 07/30/2012  . Hypothyroidism 03/06/2014  . IBS (irritable bowel syndrome) 12/29/2016  . Insomnia 08/19/2011  . Post menopausal syndrome 03/06/2014   Family History  Problem Relation Age of Onset  . Cancer Mother   . Colon cancer Mother   . Diabetes Father   . Heart disease Father   . Cancer Maternal Grandmother   . Diabetes Paternal Grandfather    Past Surgical History:  Procedure Laterality Date  . ABDOMINAL HYSTERECTOMY    . CESAREAN SECTION    . DILATION AND CURETTAGE OF UTERUS    . GALLBLADDER SURGERY    . SPINE SURGERY     Social History   Occupational History  . Not on file.   Social History Main Topics  . Smoking status: Former Research scientist (life sciences)  .  Smokeless tobacco: Never Used  . Alcohol use 4.2 oz/week    7 Glasses of wine per week  . Drug use: No  . Sexual activity: Yes    Partners: Male    Birth control/ protection: Surgical    OBJECTIVE:  VS:  HT:5' 2.5" (158.8 cm)   WT:143 lb (64.9 kg)  BMI:25.8    BP:122/74  HR:74bpm  TEMP: ( )  RESP:97 % EXAM: Findings:  Right lower extremity overall well aligned.'s previous swelling is significantly improved.  She has focal TTP over the right fibula diffusely.  Ankle drawer testing is normal.  No pain with talar tilt.  Midfoot motion is nonpainful.  She has overall improved dorsiflexion and plantar flexion but still mildly limited.  Persistent pain with weightbearing.     Mr Ankle Right Wo Contrast  Result Date: 01/20/2017 CLINICAL DATA:  Right lateral ankle pain.  Swelling. EXAM: MRI OF THE RIGHT ANKLE WITHOUT CONTRAST TECHNIQUE: Multiplanar, multisequence MR imaging of the ankle was performed. No intravenous contrast was administered. COMPARISON:  01/02/2017 radiograph FINDINGS: TENDONS Peroneal: Mild common peroneus tendon sheath tenosynovitis. Posteromedial: Distal tibialis posterior tenosynovitis along with mild distal tendinopathy. Anterior: Unremarkable Achilles: Mild edema in Kager' s fat pad but the Achilles tendon appears intact. Plantar Fascia: Unremarkable LIGAMENTS Lateral: Mildly indistinct and attenuated anterior inferior tibiofibular ligament Medial: Mildly thickened superomedial portion of the spring ligament. CARTILAGE Ankle Joint: Trace tibiotalar joint effusion. Mild degenerative chondral thinning. Subtalar Joints/Sinus Tarsi: Unremarkable Bones: Faint low T1 signal band in the distal fibular metadiaphysis with infiltrative marrow edema, and subtle cortical thickening/ irregularity posteriorly, suspicious for a stress fracture of the distal fibula. Associated periosteal reaction and endosteal edema as well. This directly underlies the marker indicating the point of the  patient's discomfort. There surrounding soft tissue edema. Other: No supplemental non-categorized findings. IMPRESSION: 1. The dominant finding is abnormal marrow edema and cortical irregularity in the distal fibula metadiaphysis suspicious for stress fracture. Surrounding soft tissue edema noted. 2. There is some indistinctness of the anterior inferior tibiofibular ligament but I suspect that this is probably due to the soft tissue edema from the bony abnormality rather than a true syndesmotic injury. 3. Distal tibialis posterior tenosynovitis and mild tendinopathy, correlate clinically in assessing for tibialis posterior dysfunction. There is also some mild thickening of the superomedial component of the spring ligament. 4. Mild common peroneus tendon sheath tenosynovitis. 5. Trace tibiotalar joint effusion with mild degenerative chondral thinning. Electronically Signed   By: Van Clines M.D.   On: 01/20/2017 08:45   ASSESSMENT & PLAN:   Problem List Items Addressed This Visit    Right ankle pain    + for CPPD Stress fracture is present on MRI with surrounding soft tissue edema and mild thickening of the superior medial spring ligament with likely posterior tibialis insufficiency.  We will need to remain in the boot while weightbearing for an additional 2 weeks.  Okay to come out of and use Body Helix compression when nonweightbearing.  Okay to work on gentle range of motion and proprioceptive exercises.  Follow-up in 2 weeks and will need custom cushion  orthotics to correct her underlying biomechanical issues.  Should begin working on posterior tibialis strengthening exercises after follow-up      Stress fracture of right fibula - Primary    We will need to fully evaluate gait once less symptomatic for fitting of custom cushioned insoles      Relevant Orders   Comprehensive metabolic panel (Completed)   CBC with Differential/Platelet (Completed)   VITAMIN D 25 Hydroxy (Vit-D Deficiency,  Fractures) (Completed)      Follow-up: Return in about 2 weeks (around 02/16/2017) for for custom orthotics and clinical recheck.   CMA/ATC served as Education administrator during this visit. History, Physical, and Plan performed by medical provider. Documentation and orders reviewed and attested to.      Teresa Coombs, [place] [facility] Physician

## 2017-02-04 ENCOUNTER — Other Ambulatory Visit: Payer: Self-pay | Admitting: Sports Medicine

## 2017-02-04 MED ORDER — CHOLECALCIFEROL 1.25 MG (50000 UT) PO TABS: 1.0000 | ORAL_TABLET | ORAL | 0 refills | Status: DC

## 2017-02-04 MED FILL — VITAMIN D3 50000 UNIT CAPS: 1.25 MG | 84 days supply | Qty: 12 | Fill #0

## 2017-02-04 NOTE — Progress Notes (Signed)
Called pt and advised.  

## 2017-02-04 NOTE — Progress Notes (Unsigned)
Vitamin D level low.  Weekly dosing recommended.  Please call and inform patient

## 2017-02-16 ENCOUNTER — Ambulatory Visit (INDEPENDENT_AMBULATORY_CARE_PROVIDER_SITE_OTHER): Payer: 59 | Admitting: Sports Medicine

## 2017-02-16 ENCOUNTER — Encounter: Payer: Self-pay | Admitting: Sports Medicine

## 2017-02-16 ENCOUNTER — Ambulatory Visit
Admission: RE | Admit: 2017-02-16 | Discharge: 2017-02-16 | Disposition: A | Discharge status: Home / Self Care | Payer: 59 | Source: Ambulatory Visit | Attending: Family Medicine | Admitting: Family Medicine

## 2017-02-16 VITALS — BP 126/80 | HR 69 | Ht 62.5 in | Wt 142.2 lb

## 2017-02-16 DIAGNOSIS — R269 Unspecified abnormalities of gait and mobility: Secondary | ICD-10-CM | POA: Diagnosis not present

## 2017-02-16 DIAGNOSIS — Z1239 Encounter for other screening for malignant neoplasm of breast: Secondary | ICD-10-CM

## 2017-02-16 DIAGNOSIS — M25571 Pain in right ankle and joints of right foot: Secondary | ICD-10-CM | POA: Diagnosis not present

## 2017-02-16 DIAGNOSIS — Z1231 Encounter for screening mammogram for malignant neoplasm of breast: Secondary | ICD-10-CM | POA: Diagnosis not present

## 2017-02-16 DIAGNOSIS — M84363A Stress fracture, right fibula, initial encounter for fracture: Secondary | ICD-10-CM | POA: Diagnosis not present

## 2017-02-16 MED FILL — LINZESS 290 MCG CAPSULE: 290 | 30 days supply | Qty: 30 | Fill #8

## 2017-02-16 NOTE — Assessment & Plan Note (Signed)
Given significant abnormal gait changes and fibular stress fracture custom cushion orthotics fabricated today.  She should continue with minimizing weightbearing and plan follow-up 3 weeks for hopeful increase in activity.  She does have an upcoming trip to [place] and like to be ready to go on this.  Continue with the boot for prolonged ambulation.  ++++++++++++++++++++++++++++++++++++++++++++ PROCEDURE: CUSTOM ORTHOTIC FABRICATION Patient's underlying musculoskeletal conditions are directly related to poor biomechanics and will benefit from a functional custom orthotic.  There are no significant foot deformities that complicate the use of a custom orthotic.  The patient was fitted for a standard, cushioned, semi-rigid orthotic. The orthotic was heated & placed on the orthotic stand. The patient was positioned in subtalar neutral position and 10 of ankle dorsiflexion and weight bearing stance some heated orthotic blank. After completion of the molding a stable paste was applied to the orthotic blank. The orthotic was ground to a stable position for weightbearing. The patient ambulated in these and reported they were comfortable without pressure spots.              BLANK:  Size 7 - Standard Cushioned                 BASE:  Blue EVA      POSTINGS:  none

## 2017-02-16 NOTE — Progress Notes (Signed)
OFFICE VISIT NOTE Kara Kennedy. Kara Kennedy, [place] at [place]  Kara Kennedy - 61 y.o. female MRN [id number]  Date of birth: Oct 29, 1955  Visit Date: 02/16/2017  PCP: [name], DO   Referred by: [name], DO  Elmon Kirschner, CMA acting as scribe for Dr. [name].  SUBJECTIVE:   Chief Complaint  Patient presents with  . Follow-up    stress fracture    HPI: As below and per problem based documentation when appropriate.  Patient present today to follow up on stress fracture, right fibula   MRI 01/19/17  Gentle ROM, Proprioceptive exercises   Custom cushion orthotics,gait eval     Review of Systems  Constitutional: Negative for chills and fever.  Respiratory: Negative for shortness of breath and wheezing.   Cardiovascular: Negative for chest pain and palpitations.  Musculoskeletal: Negative for falls.  Neurological: Negative for dizziness, tingling, weakness and headaches.  Endo/Heme/Allergies: Does not bruise/bleed easily.    Otherwise per HPI.  HISTORY & PERTINENT PRIOR DATA:  No specialty comments available. She reports that she has quit smoking. She has never used smokeless tobacco.   Recent Labs  12/29/16 1054  LABURIC 5.0   Medications & Allergies reviewed per EMR Patient Active Problem List   Diagnosis Date Noted  . Stress fracture of right fibula 02/02/2017  . Right ankle pain 01/02/2017  . IBS (irritable bowel syndrome) 12/29/2016  . Family history of colon cancer 12/29/2016  . Anxiety 03/06/2014  . Hypothyroidism 03/06/2014  . Post menopausal syndrome 03/06/2014  . Chronic neck pain 09/06/2012  . Allergic rhinitis 07/30/2012  . History of pyelonephritis 07/30/2012  . Insomnia 08/19/2011   Past Medical History:  Diagnosis Date  . Allergic rhinitis 07/30/2012  . Anxiety 03/06/2014  . Chronic neck pain 09/06/2012  . Endometriosis   . Family history of colon cancer 12/29/2016  . History of  pyelonephritis 07/30/2012  . Hypothyroidism 03/06/2014  . IBS (irritable bowel syndrome) 12/29/2016  . Insomnia 08/19/2011  . Post menopausal syndrome 03/06/2014   Family History  Problem Relation Age of Onset  . Cancer Mother   . Colon cancer Mother   . Diabetes Father   . Heart disease Father   . Cancer Maternal Grandmother   . Diabetes Paternal Grandfather    Past Surgical History:  Procedure Laterality Date  . ABDOMINAL HYSTERECTOMY    . CESAREAN SECTION    . DILATION AND CURETTAGE OF UTERUS    . GALLBLADDER SURGERY    . SPINE SURGERY     Social History   Occupational History  . Not on file.   Social History Main Topics  . Smoking status: Former Research scientist (life sciences)  . Smokeless tobacco: Never Used  . Alcohol use 4.2 oz/week    7 Glasses of wine per week  . Drug use: No  . Sexual activity: Yes    Partners: Male    Birth control/ protection: Surgical    OBJECTIVE:  VS:  HT:5' 2.5" (158.8 cm)   WT:142 lb 3.2 oz (64.5 kg)  BMI:25.6    BP:126/80  HR:69bpm  TEMP: ( )  RESP:96 % EXAM: Findings:  Adult female.  No acute distress.  Alert and appropriate. Right lower extremities overall well aligned.  Swelling has essentially resolved.  She has focal TTP over the distal fibula.  No pain with cotton testing, dorsiflexion plantarflexion strength is normal.  No pain with talar tilt.  She walks with a antalgic gait.  She does walk with a slightly externally rotated leg.  Pain with one footed standing this time mild and significantly better than in the past.     Mm Screening Breast Tomo Bilateral  Result Date: 02/17/2017 CLINICAL DATA:  Screening. EXAM: 2D DIGITAL SCREENING BILATERAL MAMMOGRAM WITH CAD AND ADJUNCT TOMO COMPARISON:  Previous exam(s). ACR Breast Density Category c: The breast tissue is heterogeneously dense, which may obscure small masses. FINDINGS: There are no findings suspicious for malignancy. Images were processed with CAD. IMPRESSION: No mammographic evidence of  malignancy. A result letter of this screening mammogram will be mailed directly to the patient. RECOMMENDATION: Screening mammogram in one year. (Code:SM-B-01Y) BI-RADS CATEGORY  1: Negative. Electronically Signed   By: Everlean Alstrom M.D.   On: 02/17/2017 13:26   ASSESSMENT & PLAN:     ICD-10-CM   1. Stress fracture of right fibula, initial encounter M84.363A   2. Acute right ankle pain M25.571   3. Gait disturbance R26.9   ================================================================= Stress fracture of right fibula Given significant abnormal gait changes and fibular stress fracture custom cushion orthotics fabricated today.  She should continue with minimizing weightbearing and plan follow-up 3 weeks for hopeful increase in activity.  She does have an upcoming trip to [place] and like to be ready to go on this.  Continue with the boot for prolonged ambulation.  ++++++++++++++++++++++++++++++++++++++++++++ PROCEDURE: CUSTOM ORTHOTIC FABRICATION Patient's underlying musculoskeletal conditions are directly related to poor biomechanics and will benefit from a functional custom orthotic.  There are no significant foot deformities that complicate the use of a custom orthotic.  The patient was fitted for a standard, cushioned, semi-rigid orthotic. The orthotic was heated & placed on the orthotic stand. The patient was positioned in subtalar neutral position and 10 of ankle dorsiflexion and weight bearing stance some heated orthotic blank. After completion of the molding a stable paste was applied to the orthotic blank. The orthotic was ground to a stable position for weightbearing. The patient ambulated in these and reported they were comfortable without pressure spots.              BLANK:  Size 7 - Standard Cushioned                 BASE:  Blue EVA      POSTINGS:  none   Right ankle pain Pain continues to improve she has been able to decrease her activity and weightbearing at work.  Recommend  beginning to wean out of the boot but continuing to minimize weightbearing activity.  Cushion orthotics to be worn while while weightbearing.  ================================================================= Follow-up: Return in about 3 weeks (around 03/09/2017) for repeat clinical exam.   CMA/ATC served as scribe during this visit. History, Physical, and Plan performed by medical provider. Documentation and orders reviewed and attested to.      Teresa Coombs, [place] [facility] Physician

## 2017-02-19 NOTE — Assessment & Plan Note (Signed)
Pain continues to improve she has been able to decrease her activity and weightbearing at work.  Recommend beginning to wean out of the boot but continuing to minimize weightbearing activity.  Cushion orthotics to be worn while while weightbearing.

## 2017-03-02 ENCOUNTER — Other Ambulatory Visit: Payer: Self-pay | Admitting: Surgical

## 2017-03-02 DIAGNOSIS — H903 Sensorineural hearing loss, bilateral: Secondary | ICD-10-CM | POA: Diagnosis not present

## 2017-03-02 DIAGNOSIS — H9313 Tinnitus, bilateral: Secondary | ICD-10-CM | POA: Diagnosis not present

## 2017-03-02 MED ORDER — MONTELUKAST SODIUM 10 MG PO TABS: 10.0000 mg | ORAL_TABLET | Freq: Every day | ORAL | 3 refills | Status: DC

## 2017-03-02 MED ORDER — FLUOXETINE HCL 20 MG PO CAPS: 20.0000 mg | ORAL_CAPSULE | Freq: Every day | ORAL | 1 refills | Status: DC

## 2017-03-02 MED FILL — FLUoxetine HCL 20 MG CAPS: 20 | 90 days supply | Qty: 90 | Fill #0

## 2017-03-02 MED FILL — MONTELUKAST SOD 10 MG TAB: 10 | 90 days supply | Qty: 90 | Fill #0

## 2017-03-03 MED FILL — GABAPENTIN 300 MG CAPSULE: 300 | 30 days supply | Qty: 90 | Fill #2

## 2017-03-09 ENCOUNTER — Encounter: Payer: Self-pay | Admitting: Sports Medicine

## 2017-03-09 ENCOUNTER — Ambulatory Visit (INDEPENDENT_AMBULATORY_CARE_PROVIDER_SITE_OTHER): Payer: 59 | Admitting: Sports Medicine

## 2017-03-09 VITALS — BP 112/84 | HR 80 | Ht 62.5 in | Wt 141.4 lb

## 2017-03-09 DIAGNOSIS — M84363A Stress fracture, right fibula, initial encounter for fracture: Secondary | ICD-10-CM

## 2017-03-09 DIAGNOSIS — E559 Vitamin D deficiency, unspecified: Secondary | ICD-10-CM

## 2017-03-09 NOTE — Progress Notes (Signed)
OFFICE VISIT NOTE Kara Kennedy. Kara Kennedy, [facility] [place]  Kara Kennedy - 61 y.o. female MRN [id number]  Date of birth: [birth year]  Visit Date: 03/09/2017  PCP: [name], DO   Referred by: [name], DO  [name], CMA acting as scribe for Dr. [name].  SUBJECTIVE:   Chief Complaint  Patient presents with  . Follow-up    stress fracture of rt fibula   HPI: As below and per problem based documentation when appropriate.  Kara Kennedy is an established patient presenting today in follow-up of a stress fracture of the right fibula. She had custom orthotics made at her last visit 02/16/2017. She was also advised to minimize weight bearing and to wear her boot during prolonged ambulation.   Pt says that her orthotics are "fine". She says that she can tell a difference when she wears shoes without orthotics, she feels more unstable. She has been wearing her Body Helix compression sleeve and says that she feels rubbing on the back of her ankle when she wears her shoes with the orthotics, she thinks she may need to get a larger shoe. She reports that she has been minimizing weight bearing activity. She says that she hasn't really been on her feet for more than 30 minutes at a time so she hasn't had to wear her boot very often. She reports that she feels well overall, minimal pain in the ankle. She has some pain while walking and pain when she lays down with the lateral aspect of the ankle on the bed, sometimes this will wake her from her sleep. She still has some trouble when walking downstairs, she does better going upstairs. She has not needed to take the Hydrocodone but has been taking Tramadol sometimes.     Review of Systems  Constitutional: Negative for chills and fever.  Respiratory: Negative for shortness of breath and wheezing.   Cardiovascular: Negative for chest pain, palpitations and leg swelling.    Musculoskeletal: Negative for falls.  Neurological: Negative for dizziness, tingling and headaches.  Endo/Heme/Allergies: Does not bruise/bleed easily.    Otherwise per HPI.  HISTORY & PERTINENT PRIOR DATA:  No specialty comments available. She reports that she has quit smoking. She has never used smokeless tobacco.   Recent Labs  12/29/16 1054  LABURIC 5.0   Medications & Allergies reviewed per EMR Patient Active Problem List   Diagnosis Date Noted  . Vitamin D deficiency 03/09/2017  . Stress fracture of right fibula 02/02/2017  . Right ankle pain 01/02/2017  . IBS (irritable bowel syndrome) 12/29/2016  . Family history of colon cancer 12/29/2016  . Anxiety 03/06/2014  . Hypothyroidism 03/06/2014  . Post menopausal syndrome 03/06/2014  . Chronic neck pain 09/06/2012  . Allergic rhinitis 07/30/2012  . History of pyelonephritis 07/30/2012  . Insomnia 08/19/2011   Past Medical History:  Diagnosis Date  . Allergic rhinitis 07/30/2012  . Anxiety 03/06/2014  . Chronic neck pain 09/06/2012  . Endometriosis   . Family history of colon cancer 12/29/2016  . History of pyelonephritis 07/30/2012  . Hypothyroidism 03/06/2014  . IBS (irritable bowel syndrome) 12/29/2016  . Insomnia 08/19/2011  . Post menopausal syndrome 03/06/2014   Family History  Problem Relation Age of Onset  . Cancer Mother   . Colon cancer Mother   . Diabetes Father   . Heart disease Father   . Cancer Maternal Grandmother   . Diabetes Paternal Grandfather  Past Surgical History:  Procedure Laterality Date  . ABDOMINAL HYSTERECTOMY    . CESAREAN SECTION    . DILATION AND CURETTAGE OF UTERUS    . GALLBLADDER SURGERY    . SPINE SURGERY     Social History   Occupational History  . Not on file.   Social History Main Topics  . Smoking status: Former Research scientist (life sciences)  . Smokeless tobacco: Never Used  . Alcohol use 4.2 oz/week    7 Glasses of wine per week  . Drug use: No  . Sexual activity: Yes    Partners: Male     Birth control/ protection: Surgical    OBJECTIVE:  VS:  HT:5' 2.5" (158.8 cm)   WT:141 lb 6.4 oz (64.1 kg)  BMI:25.5    BP:112/84  HR:80bpm  TEMP: ( )  RESP:97 % EXAM: Findings:  Right lower extremities overall well aligned.  She has chronic venous stasis changes of the right lower extremity without significant edema.  There is no focal ankle effusion.  She has pain along the anterior lateral ankle and over the syndesmosis.  Mild pain with calcaneal squeeze test but this is minimal.  Ankle drawer testing is normal.  Pain with tuning fork test over the lateral malleolus.  No pain at the fibular head.   Prior MRI previously reviewed ASSESSMENT & PLAN:     ICD-10-CM   1. Stress fracture of right fibula, initial encounter M84.363A   2. Vitamin D deficiency E55.9   ================================================================= Stress fracture of right fibula Persistent focal pain as well as pain with tuning fork test over the distal fibula.  Mild pain with palpation of the calcaneus. Given persistent ongoing symptoms that have continued to be recalcitrant we will have her add a Aircast stirrup while she is ambulatory.  Continue with custom cushion orthotics as previously fabricated and add 2 Tums on a daily basis to help with calcium intake.  She is continuing vitamin D supplementation.  6 week follow-up for clinical reevaluation.  If any worsening symptoms she will follow-up sooner.  Vitamin D deficiency On 50,000 units once weekly.  Will need to recheck ================================================================= Follow-up: Return in about 6 weeks (around 04/20/2017) for repeat clinical exam.   CMA/ATC served as scribe during this visit. History, Physical, and Plan performed by medical provider. Documentation and orders reviewed and attested to.      Teresa Coombs, [facility] Physician

## 2017-03-09 NOTE — Assessment & Plan Note (Signed)
On 50,000 units once weekly.  Will need to recheck

## 2017-03-09 NOTE — Assessment & Plan Note (Signed)
Persistent focal pain as well as pain with tuning fork test over the distal fibula.  Mild pain with palpation of the calcaneus. Given persistent ongoing symptoms that have continued to be recalcitrant we will have her add a Aircast stirrup while she is ambulatory.  Continue with custom cushion orthotics as previously fabricated and add 2 Tums on a daily basis to help with calcium intake.  She is continuing vitamin D supplementation.  6 week follow-up for clinical reevaluation.  If any worsening symptoms she will follow-up sooner.

## 2017-03-10 ENCOUNTER — Ambulatory Visit (INDEPENDENT_AMBULATORY_CARE_PROVIDER_SITE_OTHER): Payer: 59 | Admitting: Family Medicine

## 2017-03-10 VITALS — BP 124/76 | HR 75

## 2017-03-10 DIAGNOSIS — R519 Headache, unspecified: Secondary | ICD-10-CM

## 2017-03-10 DIAGNOSIS — R51 Headache: Secondary | ICD-10-CM | POA: Diagnosis not present

## 2017-03-10 MED ORDER — VALACYCLOVIR HCL 1 G PO TABS: 1000.0000 mg | ORAL_TABLET | Freq: Three times a day (TID) | ORAL | 0 refills | Status: AC

## 2017-03-10 MED FILL — valACYclovir HCL 1 GM TABS: 1 | 7 days supply | Qty: 21 | Fill #0

## 2017-03-12 ENCOUNTER — Encounter: Payer: Self-pay | Admitting: Family Medicine

## 2017-03-12 DIAGNOSIS — R519 Headache, unspecified: Secondary | ICD-10-CM | POA: Insufficient documentation

## 2017-03-12 DIAGNOSIS — R51 Headache: Principal | ICD-10-CM

## 2017-03-12 NOTE — Progress Notes (Signed)
Kara Kennedy is a 61 y.o. female here for an acute visit.  History of Present Illness:   HPI: Pain in right temporal area that radiates to right eye and ear. Started yesterday. Constant. Worsening. Aggravated by opening mouth wide. No skin lesions. No trauma. No headache or associated symptoms. No treatment. Hx of shingles twice. Pain is mild to moderate at this time.   PMHx, SurgHx, SocialHx, Medications, and Allergies were reviewed in the Visit Navigator and updated as appropriate.  Current Medications:   .  ALPRAZolam (XANAX) 0.5 MG tablet, Take by mouth., Disp: , Rfl:  .  Cholecalciferol 50000 units TABS, Take 1 tablet by mouth once a week., Disp: 12 tablet, Rfl: 0 .  Colchicine (MITIGARE) 0.6 MG CAPS, Take 1 capsule by mouth 2 (two) times daily as needed., Disp: 15 capsule, Rfl: 0 .  docusate sodium (COLACE) 100 MG capsule, Take 100 mg by mouth 2 (two) times daily., Disp: , Rfl:  .  estrogens, conjugated, (PREMARIN) 0.45 MG tablet, Take 1 tablet (0.45 mg total) by mouth daily. Take daily for 21 days then do not take for 7 days., Disp: 90 tablet, Rfl: 3 .  FLUoxetine (PROZAC) 20 MG capsule, Take 1 capsule (20 mg total) by mouth daily., Disp: 90 capsule, Rfl: 1 .  gabapentin (NEURONTIN) 300 MG capsule, Take by mouth., Disp: , Rfl:  .  levothyroxine (SYNTHROID) 88 MCG tablet, TAKE 1 TABLET BY MOUTH EVERY MORNING, Disp: , Rfl:  .  linaclotide (LINZESS) 290 MCG CAPS capsule, Take by mouth., Disp: , Rfl:  .  montelukast (SINGULAIR) 10 MG tablet, Take 1 tablet (10 mg total) by mouth daily., Disp: 90 tablet, Rfl: 3  Allergies  Allergen Reactions  . Sulfamethoxazole-Trimethoprim    Review of Systems:   Pertinent items are noted in the HPI. Otherwise, ROS is negative.  Vitals:   Vitals:   03/10/17 1430  BP: 124/76  Pulse: 75  PF: 97 L/min     There is no height or weight on file to calculate BMI.   Physical Exam:   Physical Exam  Constitutional: She appears well-nourished.    HENT:  Head: Normocephalic and atraumatic.    Lines indicate area of pain. No lesions. No TMJ pain. Right ear WNL.   Eyes: Pupils are equal, round, and reactive to light. EOM are normal.  Neck: Normal range of motion. Neck supple.  Cardiovascular: Normal rate, regular rhythm, normal heart sounds and intact distal pulses.   Pulmonary/Chest: Effort normal.  Abdominal: Soft.  Skin: Skin is warm.  Psychiatric: She has a normal mood and affect. Her behavior is normal.  Nursing note and vitals reviewed.  Results for orders placed or performed in visit on 02/02/17  Comprehensive metabolic panel  Result Value Ref Range   Sodium 138 135 - 145 mEq/L   Potassium 4.8 3.5 - 5.1 mEq/L   Chloride 103 96 - 112 mEq/L   CO2 27 19 - 32 mEq/L   Glucose, Bld 99 70 - 99 mg/dL   BUN 16 6 - 23 mg/dL   Creatinine, Ser 0.76 0.40 - 1.20 mg/dL   Total Bilirubin 0.3 0.2 - 1.2 mg/dL   Alkaline Phosphatase 75 39 - 117 U/L   AST 18 0 - 37 U/L   ALT 15 0 - 35 U/L   Total Protein 6.9 6.0 - 8.3 g/dL   Albumin 4.2 3.5 - 5.2 g/dL   Calcium 9.6 8.4 - 10.5 mg/dL   GFR 82.00 >60.00 mL/min  CBC with Differential/Platelet  Result Value Ref Range   WBC 6.9 4.0 - 10.5 K/uL   RBC 4.39 3.87 - 5.11 Mil/uL   Hemoglobin 13.7 12.0 - 15.0 g/dL   HCT 41.0 36.0 - 46.0 %   MCV 93.2 78.0 - 100.0 fl   MCHC 33.4 30.0 - 36.0 g/dL   RDW 12.7 11.5 - 15.5 %   Platelets 310.0 150.0 - 400.0 K/uL   Neutrophils Relative % 64.9 43.0 - 77.0 %   Lymphocytes Relative 21.3 12.0 - 46.0 %   Monocytes Relative 7.2 3.0 - 12.0 %   Eosinophils Relative 4.7 0.0 - 5.0 %   Basophils Relative 1.9 0.0 - 3.0 %   Neutro Abs 4.5 1.4 - 7.7 K/uL   Lymphs Abs 1.5 0.7 - 4.0 K/uL   Monocytes Absolute 0.5 0.1 - 1.0 K/uL   Eosinophils Absolute 0.3 0.0 - 0.7 K/uL   Basophils Absolute 0.1 0.0 - 0.1 K/uL  VITAMIN D 25 Hydroxy (Vit-D Deficiency, Fractures)  Result Value Ref Range   VITD 25.19 (L) 30.00 - 100.00 ng/mL   Assessment and Plan:    Diagnoses and all orders for this visit:  Right sided facial pain Comments: High concern for early shingles with dermatomal distribution. No lesions. Will go ahead a nd start treatment. Red flags reviewed. DDx includes trigeminal neuralgia and temporal arteritis, though low on list.  Orders: -     valACYclovir (VALTREX) 1000 MG tablet; Take 1 tablet (1,000 mg total) by mouth 3 (three) times daily.    . Reviewed expectations re: course of current medical issues. . Discussed self-management of symptoms. . Outlined signs and symptoms indicating need for more acute intervention. . Patient verbalized understanding and all questions were answered. Marland Kitchen Health Maintenance issues including appropriate healthy diet, exercise, and smoking avoidance were discussed with patient. . See orders for this visit as documented in the electronic medical record. . Patient received an After Visit Summary.  CMA served as Education administrator during this visit. History, Physical, and Plan performed by medical provider. The above documentation has been reviewed and is accurate and complete. [name], D.O.  [name], DO [place], [place] 03/12/2017  Future Appointments Date Time Provider [place]  04/20/2017 11:00 AM [name], DO [facility] None  01/20/2018 2:30 PM Salvadore Dom, MD [place] None

## 2017-03-16 MED FILL — LINZESS 290 MCG CAPSULE: 290 | 30 days supply | Qty: 30 | Fill #9

## 2017-03-26 MED FILL — SYNTHROID 88 MCG TABLET: 88 | 90 days supply | Qty: 90 | Fill #0

## 2017-03-26 MED FILL — PREMARIN 0.625 MG TABLET: 0.625 | 90 days supply | Qty: 90 | Fill #1

## 2017-03-31 ENCOUNTER — Ambulatory Visit (INDEPENDENT_AMBULATORY_CARE_PROVIDER_SITE_OTHER): Payer: 59 | Admitting: Family Medicine

## 2017-03-31 ENCOUNTER — Encounter: Payer: Self-pay | Admitting: Family Medicine

## 2017-03-31 VITALS — BP 100/80 | HR 66 | Temp 97.7°F | Ht 62.5 in | Wt 143.2 lb

## 2017-03-31 DIAGNOSIS — J029 Acute pharyngitis, unspecified: Secondary | ICD-10-CM | POA: Diagnosis not present

## 2017-03-31 MED ORDER — PREDNISONE 5 MG PO TABS: ORAL_TABLET | ORAL | 0 refills | Status: DC

## 2017-03-31 MED ORDER — DOXYCYCLINE HYCLATE 100 MG PO TABS: 100.0000 mg | ORAL_TABLET | Freq: Two times a day (BID) | ORAL | 0 refills | Status: DC

## 2017-03-31 MED FILL — predniSONE 5 MG (21) TBPK: 5 | 6 days supply | Qty: 21 | Fill #0

## 2017-03-31 MED FILL — GABAPENTIN 300 MG CAPSULE: 300 | 30 days supply | Qty: 90 | Fill #3

## 2017-03-31 MED FILL — DOXYCYCLINE HYC 100 MG TAB: 100 | 10 days supply | Qty: 20 | Fill #0

## 2017-03-31 NOTE — Progress Notes (Signed)
Kara Kennedy is a 61 y.o. female is here for follow up.  History of Present Illness:   Kara Kennedy, cma is acting as a Education administrator for   HPI:  Kara Kennedy reports a sore throat feeling "raw." Associated sx is intermittent dry cough. Initally started last Friday and gradually worsened. There is difficulty swallowing. Denies any fever. No HX of strep throat. Took tylenol and using Lozenges with minimal relief.   Health Maintenance Due  Topic Date Due  . Hepatitis C Screening  [birth year]  . HIV Screening  04/03/1970  . COLONOSCOPY  04/03/2005  . PAP SMEAR  07/09/2015  . INFLUENZA VACCINE  02/25/2017   Depression screen PHQ 2/9 03/12/2017  Decreased Interest 0  Down, Depressed, Hopeless 0  PHQ - 2 Score 0   PMHx, SurgHx, SocialHx, FamHx, Medications, and Allergies were reviewed in the Visit Navigator and updated as appropriate.   Patient Active Problem List   Diagnosis Date Noted  . Right sided facial pain 03/12/2017  . Vitamin D deficiency 03/09/2017  . Stress fracture of right fibula 02/02/2017  . Right ankle pain 01/02/2017  . IBS (irritable bowel syndrome) 12/29/2016  . Family history of colon cancer 12/29/2016  . Anxiety 03/06/2014  . Hypothyroidism 03/06/2014  . Post menopausal syndrome 03/06/2014  . Chronic neck pain 09/06/2012  . Allergic rhinitis 07/30/2012  . History of pyelonephritis 07/30/2012  . Insomnia 08/19/2011   Social History  Substance Use Topics  . Smoking status: Former Research scientist (life sciences)  . Smokeless tobacco: Never Used  . Alcohol use 4.2 oz/week    7 Glasses of wine per week   Current Medications and Allergies:   Current Outpatient Prescriptions:  .  ALPRAZolam (XANAX) 0.5 MG tablet, Take by mouth., Disp: , Rfl:  .  Cholecalciferol 50000 units TABS, Take 1 tablet by mouth once a week., Disp: 12 tablet, Rfl: 0 .  Colchicine (MITIGARE) 0.6 MG CAPS, Take 1 capsule by mouth 2 (two) times daily as needed., Disp: 15 capsule, Rfl: 0 .  docusate sodium (COLACE) 100  MG capsule, Take 100 mg by mouth 2 (two) times daily., Disp: , Rfl:  .  estrogens, conjugated, (PREMARIN) 0.45 MG tablet, Take 1 tablet (0.45 mg total) by mouth daily. Take daily for 21 days then do not take for 7 days., Disp: 90 tablet, Rfl: 3 .  FLUoxetine (PROZAC) 20 MG capsule, Take 1 capsule (20 mg total) by mouth daily., Disp: 90 capsule, Rfl: 1 .  gabapentin (NEURONTIN) 300 MG capsule, Take by mouth., Disp: , Rfl:  .  levothyroxine (SYNTHROID) 88 MCG tablet, TAKE 1 TABLET BY MOUTH EVERY MORNING, Disp: , Rfl:  .  linaclotide (LINZESS) 290 MCG CAPS capsule, Take by mouth., Disp: , Rfl:  .  montelukast (SINGULAIR) 10 MG tablet, Take 1 tablet (10 mg total) by mouth daily., Disp: 90 tablet, Rfl: 3 .  traMADol (ULTRAM) 50 MG tablet, , Disp: , Rfl: 0   Allergies  Allergen Reactions  . Sulfamethoxazole-Trimethoprim    Review of Systems   Pertinent items are noted in the HPI. Otherwise, ROS is negative.  Vitals:   Vitals:   03/31/17 1359  BP: 100/80  Pulse: 66  Temp: 97.7 F (36.5 C)  TempSrc: Oral  SpO2: 98%  Weight: 143 lb 3.2 oz (65 kg)  Height: 5' 2.5" (1.588 m)     Body mass index is 25.77 kg/m.   Physical Exam:   Physical Exam  Constitutional: She appears well-nourished.  HENT:  Head: Normocephalic and  atraumatic.  Mouth/Throat: Posterior oropharyngeal erythema present.  Eyes: Pupils are equal, round, and reactive to light. EOM are normal.  Neck: Normal range of motion. Neck supple.  Cardiovascular: Normal rate, regular rhythm, normal heart sounds and intact distal pulses.   Pulmonary/Chest: Effort normal.  Abdominal: Soft.  Skin: Skin is warm.  Psychiatric: She has a normal mood and affect. Her behavior is normal.  Nursing note and vitals reviewed.    Assessment and Plan:   Kara Kennedy was seen today for sore throat.  Diagnoses and all orders for this visit:  Acute pharyngitis, unspecified etiology Comments: Likely viral. Symptomatic care and red flags  reviewed. Orders: -     predniSONE (DELTASONE) 5 MG tablet; 6-5-4-3-2-1-off   . Reviewed expectations re: course of current medical issues. . Discussed self-management of symptoms. . Outlined signs and symptoms indicating need for more acute intervention. . Patient verbalized understanding and all questions were answered. Marland Kitchen Health Maintenance issues including appropriate healthy diet, exercise, and smoking avoidance were discussed with patient. . See orders for this visit as documented in the electronic medical record. . Patient received an After Visit Summary.  CMA served as Education administrator during this visit. History, Physical, and Plan performed by medical provider. The above documentation has been reviewed and is accurate and complete. [name], D.O.  [name], DO [place], [place] 04/02/2017  Future Appointments Date Time Provider [place]  04/20/2017 11:00 AM [name], DO [facility] None  01/20/2018 2:30 PM Salvadore Dom, MD [facility] None

## 2017-04-20 ENCOUNTER — Encounter: Payer: Self-pay | Admitting: Sports Medicine

## 2017-04-20 ENCOUNTER — Ambulatory Visit (INDEPENDENT_AMBULATORY_CARE_PROVIDER_SITE_OTHER): Payer: 59 | Admitting: Sports Medicine

## 2017-04-20 VITALS — BP 120/80 | HR 69 | Ht 62.5 in | Wt 140.0 lb

## 2017-04-20 DIAGNOSIS — R269 Unspecified abnormalities of gait and mobility: Secondary | ICD-10-CM | POA: Diagnosis not present

## 2017-04-20 DIAGNOSIS — M25571 Pain in right ankle and joints of right foot: Secondary | ICD-10-CM

## 2017-04-20 DIAGNOSIS — M84363A Stress fracture, right fibula, initial encounter for fracture: Secondary | ICD-10-CM

## 2017-04-20 NOTE — Assessment & Plan Note (Signed)
Patient's biomechanics did put her at risk for fibular stress fracture.  She has improved proprioception with FASTEC custom cushion orthotics as well as with Body Helix compression sleeve.  She will benefit from therapeutic exercises reviewed today per procedure note and AVS.  We will plan to have her follow-up as needed and encouraged her to continue using the devices as above.

## 2017-04-20 NOTE — Patient Instructions (Signed)
Please perform the exercise program that we have prepared for you and gone over in detail on a daily basis.  In addition to the handout you were provided you can access your program through: [email or web address]   Your unique program code is: [email or web address]

## 2017-04-20 NOTE — Assessment & Plan Note (Signed)
Multifactorial with slight abnormality in her gait.  We will plan to have her follow-up as needed

## 2017-04-20 NOTE — Progress Notes (Signed)
OFFICE VISIT NOTE Kara Kennedy, Kara Kennedy  Kara Kennedy - 61 y.o. female MRN [id number]  Date of birth: May 09, 1956  Visit Date: 04/20/2017  PCP: [name], DO   Referred by: [name], DO  [name], CMA acting as scribe for Dr. [name].  SUBJECTIVE:   Chief Complaint  Patient presents with  . Follow-up    stress fracture RT fibula   HPI: As below and per problem based documentation when appropriate.  Kara Kennedy is an established patient presenting today in follow-up of fracture RT fibula.   She was wearing the air cast but it was putting pressure on the ankle and causing a lot of discomfort so she has been wearing the Body Helix compression sleeve. She has been in the compression sleeve x 4-5 weeks. She is still having irritation around the ankle from the sleeve. She still has mild pain when bearing weight. Pain seems to be worse at night. She has not noticed any swelling. She has been wearing the custom orthotics in her shoes. She feels a difference in how she steps when she wears the orthotics. She still feels a little hesistant and unsteady when she tried walking around without the sleeve.     Review of Systems  Constitutional: Negative for chills and fever.  Respiratory: Negative for shortness of breath and wheezing.   Cardiovascular: Negative for chest pain, palpitations and leg swelling.  Musculoskeletal: Negative for falls.  Neurological: Negative for dizziness, tingling and headaches.  Endo/Heme/Allergies: Does not bruise/bleed easily.    Otherwise per HPI.  HISTORY & PERTINENT PRIOR DATA:  No specialty comments available. She reports that she has quit smoking. She has never used smokeless tobacco.   Recent Labs  12/29/16 1054  LABURIC 5.0   Medications & Allergies reviewed per EMR Patient Active Problem List   Diagnosis Date Noted  . Right sided facial pain 03/12/2017  .  Vitamin D deficiency 03/09/2017  . Stress fracture of right fibula 02/02/2017  . Right ankle pain 01/02/2017  . IBS (irritable bowel syndrome) 12/29/2016  . Family history of colon cancer 12/29/2016  . Anxiety 03/06/2014  . Hypothyroidism 03/06/2014  . Post menopausal syndrome 03/06/2014  . Chronic neck pain 09/06/2012  . Allergic rhinitis 07/30/2012  . History of pyelonephritis 07/30/2012  . Insomnia 08/19/2011   Past Medical History:  Diagnosis Date  . Allergic rhinitis 07/30/2012  . Anxiety 03/06/2014  . Chronic neck pain 09/06/2012  . Endometriosis   . Family history of colon cancer 12/29/2016  . History of pyelonephritis 07/30/2012  . Hypothyroidism 03/06/2014  . IBS (irritable bowel syndrome) 12/29/2016  . Insomnia 08/19/2011  . Post menopausal syndrome 03/06/2014   Family History  Problem Relation Age of Onset  . Cancer Mother   . Colon cancer Mother   . Diabetes Father   . Heart disease Father   . Cancer Maternal Grandmother   . Diabetes Paternal Grandfather    Past Surgical History:  Procedure Laterality Date  . ABDOMINAL HYSTERECTOMY    . CESAREAN SECTION    . DILATION AND CURETTAGE OF UTERUS    . GALLBLADDER SURGERY    . SPINE SURGERY     Social History   Occupational History  . Not on file.   Social History Main Topics  . Smoking status: Former Research scientist (life sciences)  . Smokeless tobacco: Never Used  . Alcohol use 4.2 oz/week    7 Glasses of wine  per week  . Drug use: No  . Sexual activity: Yes    Partners: Male    Birth control/ protection: Surgical    OBJECTIVE:  VS:  HT:5' 2.5" (158.8 cm)   WT:140 lb (63.5 kg)  BMI:25.18    BP:120/80  HR:69bpm  TEMP: ( )  RESP:99 % EXAM: Findings:  Ankle is overall well aligned. Chronic stasis dermatitis changes without edema.  Her proprioception is fairly poor especially on the right and has 3-4 beats with relaxation with resisted inversion.  This is compared to only one beat on the left.  Eversion strength is 5 out of 5 but  once again poor motor control.  She does walk with a slight rocking of the hindfoot on the right greater than left but has no significant overpronation.  She does walk in a slightly dorsiflexed position.     No results found. ASSESSMENT & PLAN:     ICD-10-CM   1. Stress fracture of right fibula, initial encounter M84.363A Misc procedure  2. Acute right ankle pain M25.571 Misc procedure  3. Gait disturbance R26.9 Misc procedure  ================================================================= Stress fracture of right fibula Patient's biomechanics did put her at risk for fibular stress fracture.  She has improved proprioception with FASTEC custom cushion orthotics as well as with Body Helix compression sleeve.  She will benefit from therapeutic exercises reviewed today per procedure note and AVS.  We will plan to have her follow-up as needed and encouraged her to continue using the devices as above.  Right ankle pain Multifactorial with slight abnormality in her gait.  We will plan to have her follow-up as needed  PROCEDURE NOTE: THERAPEUTIC EXERCISES ([id number]) 15 minutes spent for Therapeutic exercises as below and as referenced in the AVS.  This included exercises focusing on stretching, strengthening, with significant focus on eccentric aspects.   Proper technique shown and discussed handout in great detail with ATC.  All questions were discussed and answered.   Long term goals include an improvement in range of motion, strength, endurance as well as avoiding reinjury.  Frequency of visits is one time as determined during today's office visit.  Frequency of exercises to be performed is as per handout.  EXERCISES REVIEWED:  HAMSTRING STRETCH WITH STRAP  HIP ADDUCTION  BRIDGING  Reverse Step downs    ================================================================= Patient Instructions  Please perform the exercise program that we have prepared for you and gone over in detail on a daily  basis.  In addition to the handout you were provided you can access your program through: [email or web address]   Your unique program code is: [email or web address] =================================================================  Follow-up: Return if symptoms worsen or fail to improve.   CMA/ATC served as Education administrator during this visit. History, Physical, and Plan performed by medical provider. Documentation and orders reviewed and attested to.      Teresa Coombs, [facility] Physician

## 2017-04-20 NOTE — Procedures (Signed)
PROCEDURE NOTE: THERAPEUTIC EXERCISES (97110) 15 minutes spent for Therapeutic exercises as below and as referenced in the AVS.  This included exercises focusing on stretching, strengthening, with significant focus on eccentric aspects.   Proper technique shown and discussed handout in great detail with ATC.  All questions were discussed and answered.   Long term goals include an improvement in range of motion, strength, endurance as well as avoiding reinjury.  Frequency of visits is one time as determined during today's office visit.  Frequency of exercises to be performed is as per handout.  EXERCISES REVIEWED:  HAMSTRING STRETCH WITH STRAP  HIP ADDUCTION  BRIDGING  Reverse Step downs

## 2017-04-25 MED FILL — LINZESS 290 MCG CAPSULE: 290 | 30 days supply | Qty: 30 | Fill #10

## 2017-04-27 MED FILL — GABAPENTIN 300 MG CAPSULE: 300 | 30 days supply | Qty: 90 | Fill #4

## 2017-05-19 ENCOUNTER — Encounter: Payer: Self-pay | Admitting: Family Medicine

## 2017-05-19 ENCOUNTER — Ambulatory Visit (INDEPENDENT_AMBULATORY_CARE_PROVIDER_SITE_OTHER): Payer: 59 | Admitting: Family Medicine

## 2017-05-19 VITALS — BP 106/68 | HR 69 | Temp 97.7°F | Ht 62.5 in | Wt 140.0 lb

## 2017-05-19 DIAGNOSIS — B349 Viral infection, unspecified: Secondary | ICD-10-CM

## 2017-05-19 MED FILL — LINZESS 290 MCG CAPSULE: 290 | 30 days supply | Qty: 30 | Fill #11

## 2017-05-19 NOTE — Progress Notes (Signed)
Kara Kennedy is a 61 y.o. female here for an acute visit.  History of Present Illness:   Kara Kennedy CMA acting as scribe for Dr. Juleen China.  HPI: Patient comes in today for acute issues. She has been having left scapula pain for 3 to 4 days. She stated that to start with it hurt to take a deep. Now it hurts to take even a shallow breath. She started with a cough today.   PMHx, SurgHx, SocialHx, Medications, and Allergies were reviewed in the Visit Navigator and updated as appropriate.  Current Medications:   .  ALPRAZolam (XANAX) 0.5 MG tablet, Take by mouth., Disp: , Rfl:  .  calcium carbonate (TUMS - DOSED IN MG ELEMENTAL CALCIUM) 500 MG chewable tablet, Chew 1 tablet by mouth daily., Disp: , Rfl:  .  Cholecalciferol (VITAMIN D3) 50000 units CAPS, , Disp: , Rfl: 0 .  Cholecalciferol 50000 units TABS, Take 1 tablet by mouth once a week., Disp: 12 tablet, Rfl: 0 .  Colchicine (MITIGARE) 0.6 MG CAPS, Take 1 capsule by mouth 2 (two) times daily as needed., Disp: 15 capsule, Rfl: 0 .  docusate sodium (COLACE) 100 MG capsule, Take 100 mg by mouth 2 (two) times daily., Disp: , Rfl:  .  FLUoxetine (PROZAC) 20 MG capsule, Take 1 capsule (20 mg total) by mouth daily., Disp: 90 capsule, Rfl: 1 .  gabapentin (NEURONTIN) 300 MG capsule, Take by mouth., Disp: , Rfl:  .  levothyroxine (SYNTHROID) 88 MCG tablet, TAKE 1 TABLET BY MOUTH EVERY MORNING, Disp: , Rfl:  .  linaclotide (LINZESS) 290 MCG CAPS capsule, Take by mouth., Disp: , Rfl:  .  montelukast (SINGULAIR) 10 MG tablet, Take 1 tablet (10 mg total) by mouth daily., Disp: 90 tablet, Rfl: 3 .  PREMARIN 0.625 MG tablet, , Disp: , Rfl: 3   Allergies  Allergen Reactions  . Sulfamethoxazole-Trimethoprim    Review of Systems:   Pertinent items are noted in the HPI. Otherwise, ROS is negative.  Vitals:   Vitals:   05/19/17 1529  BP: 106/68  Pulse: 69  Temp: 97.7 F (36.5 C)  TempSrc: Oral  SpO2: 97%  Weight: 140 lb (63.5 kg)    Height: 5' 2.5" (1.588 m)     Body mass index is 25.2 kg/m.   Physical Exam:   Physical Exam  Constitutional: She appears well-nourished.  HENT:  Head: Normocephalic and atraumatic.  Eyes: Pupils are equal, round, and reactive to light. EOM are normal.  Neck: Normal range of motion. Neck supple.  Cardiovascular: Normal rate, regular rhythm, normal heart sounds and intact distal pulses.   Pulmonary/Chest: Effort normal.  Cough.  Abdominal: Soft.  Musculoskeletal:  Left periscapular area with diffuse ttp, FROM.   Skin: Skin is warm.  Psychiatric: She has a normal mood and affect. Her behavior is normal.  Nursing note and vitals reviewed.   Assessment and Plan:   Kalinda was seen today for cough.  Diagnoses and all orders for this visit:  Viral illness Comments: Symptomatic care and red flags reviewed.     . Reviewed expectations re: course of current medical issues. . Discussed self-management of symptoms. . Outlined signs and symptoms indicating need for more acute intervention. . Patient verbalized understanding and all questions were answered. Marland Kitchen Health Maintenance issues including appropriate healthy diet, exercise, and smoking avoidance were discussed with patient. . See orders for this visit as documented in the electronic medical record. . Patient received an After Visit Summary.  CMA served as Education administrator during this visit. History, Physical, and Plan performed by medical provider. The above documentation has been reviewed and is accurate and complete. [name], D.O.  [name], DO [place] 05/22/2017  Future Appointments Date Time Provider Department Center  01/20/2018 2:30 PM Salvadore Dom, MD [place] None

## 2017-05-22 ENCOUNTER — Telehealth: Payer: Self-pay | Admitting: Family Medicine

## 2017-05-22 ENCOUNTER — Ambulatory Visit (INDEPENDENT_AMBULATORY_CARE_PROVIDER_SITE_OTHER): Payer: 59

## 2017-05-22 ENCOUNTER — Ambulatory Visit (INDEPENDENT_AMBULATORY_CARE_PROVIDER_SITE_OTHER): Payer: 59 | Admitting: Family Medicine

## 2017-05-22 ENCOUNTER — Encounter: Payer: Self-pay | Admitting: Surgical

## 2017-05-22 VITALS — BP 114/76 | HR 67 | Temp 98.2°F

## 2017-05-22 DIAGNOSIS — R05 Cough: Secondary | ICD-10-CM | POA: Diagnosis not present

## 2017-05-22 DIAGNOSIS — R69 Illness, unspecified: Secondary | ICD-10-CM | POA: Diagnosis not present

## 2017-05-22 DIAGNOSIS — R079 Chest pain, unspecified: Secondary | ICD-10-CM | POA: Diagnosis not present

## 2017-05-22 DIAGNOSIS — R059 Cough, unspecified: Secondary | ICD-10-CM

## 2017-05-22 DIAGNOSIS — J111 Influenza due to unidentified influenza virus with other respiratory manifestations: Secondary | ICD-10-CM

## 2017-05-22 LAB — POC INFLUENZA A&B (BINAX/QUICKVUE)
Influenza A, POC: NEGATIVE
Influenza B, POC: NEGATIVE

## 2017-05-22 MED ORDER — DOXYCYCLINE HYCLATE 100 MG PO TABS: 100.0000 mg | ORAL_TABLET | Freq: Two times a day (BID) | ORAL | 0 refills | Status: DC

## 2017-05-22 MED ORDER — GUAIFENESIN-CODEINE 100-10 MG/5ML PO SYRP: 10.0000 mL | ORAL_SOLUTION | Freq: Every evening | ORAL | 0 refills | Status: DC | PRN

## 2017-05-22 MED ORDER — PREDNISONE 5 MG PO TABS: 5.0000 mg | ORAL_TABLET | Freq: Every day | ORAL | 0 refills | Status: DC

## 2017-05-22 MED FILL — DOXYCYCLINE HYC 100 MG TAB: 100 | 10 days supply | Qty: 20 | Fill #0

## 2017-05-22 MED FILL — predniSONE 5 MG (21) TBPK: 5 | 6 days supply | Qty: 21 | Fill #0

## 2017-05-22 MED FILL — GUAIATUSSIN AC LIQUID: 100-10 | 12 days supply | Qty: 120 | Fill #0

## 2017-05-22 MED FILL — QVAR REDIHALER 40 MCG/ACT A: 40 | 30 days supply | Qty: 11 | Fill #0

## 2017-05-22 NOTE — Progress Notes (Signed)
Kara Kennedy is a 61 y.o. female here for an acute visit.  History of Present Illness:   Cough  This is a new problem. The current episode started in the past 7 days. The problem has been gradually worsening. The cough is non-productive. Associated symptoms include headaches, myalgias, nasal congestion and a sore throat. The symptoms are aggravated by lying down. She has tried rest for the symptoms. Her past medical history is significant for pneumonia.   PMHx, SurgHx, SocialHx, Medications, and Allergies were reviewed in the Visit Navigator and updated as appropriate.  Current Medications:   .  ALPRAZolam (XANAX) 0.5 MG tablet, Take by mouth., Disp: , Rfl:  .  calcium carbonate (TUMS - DOSED IN MG ELEMENTAL CALCIUM) 500 MG chewable tablet, Chew 1 tablet by mouth daily., Disp: , Rfl:  .  Cholecalciferol (VITAMIN D3) 50000 units CAPS, , Disp: , Rfl: 0 .  Cholecalciferol 50000 units TABS, Take 1 tablet by mouth once a week., Disp: 12 tablet, Rfl: 0 .  Colchicine (MITIGARE) 0.6 MG CAPS, Take 1 capsule by mouth 2 (two) times daily as needed., Disp: 15 capsule, Rfl: 0 .  docusate sodium (COLACE) 100 MG capsule, Take 100 mg by mouth 2 (two) times daily., Disp: , Rfl:  .  estrogens, conjugated, (PREMARIN) 0.45 MG tablet, Take 1 tablet (0.45 mg total) by mouth daily. Take daily for 21 days then do not take for 7 days., Disp: 90 tablet, Rfl: 3 .  FLUoxetine (PROZAC) 20 MG capsule, Take 1 capsule (20 mg total) by mouth daily., Disp: 90 capsule, Rfl: 1 .  gabapentin (NEURONTIN) 300 MG capsule, Take by mouth., Disp: , Rfl:  .  levothyroxine (SYNTHROID) 88 MCG tablet, TAKE 1 TABLET BY MOUTH EVERY MORNING, Disp: , Rfl:  .  linaclotide (LINZESS) 290 MCG CAPS capsule, Take by mouth., Disp: , Rfl:  .  montelukast (SINGULAIR) 10 MG tablet, Take 1 tablet (10 mg total) by mouth daily., Disp: 90 tablet, Rfl: 3 .  PREMARIN 0.625 MG tablet, , Disp: , Rfl: 3   Allergies  Allergen Reactions  .  Sulfamethoxazole-Trimethoprim    Review of Systems:   Pertinent items are noted in the HPI. Otherwise, ROS is negative.  Vitals:   Vitals:   05/22/17 1437  BP: 114/76  Pulse: 67  Temp: 98.2 F (36.8 C)  SpO2: 96%     There is no height or weight on file to calculate BMI.   Physical Exam:   Physical Exam  Constitutional: She appears well-nourished.  HENT:  Head: Normocephalic and atraumatic.  Eyes: Pupils are equal, round, and reactive to light. EOM are normal.  Neck: Normal range of motion. Neck supple.  Cardiovascular: Normal rate, regular rhythm, normal heart sounds and intact distal pulses.   Pulmonary/Chest: Effort normal.  Abdominal: Soft.  Skin: Skin is warm.  Psychiatric: She has a normal mood and affect. Her behavior is normal.  Nursing note and vitals reviewed.  EXAM: CHEST  2 VIEW  COMPARISON:  07/26/2008 .  FINDINGS: Mediastinum and hilar structures normal. Heart size normal. Lungs are clear. No pleural effusion or pneumothorax. Surgical clips right upper quadrant . Prior cervicothoracic spine fusion .  IMPRESSION: No active cardiopulmonary disease.  Results for orders placed or performed in visit on 05/22/17  POC Influenza A&B(BINAX/QUICKVUE)  Result Value Ref Range   Influenza A, POC Negative Negative   Influenza B, POC Negative Negative   Assessment and Plan:   Diagnoses and all orders for this visit:  Cough -     DG Chest 2 View; Future -     doxycycline (VIBRA-TABS) 100 MG tablet; Take 1 tablet (100 mg total) by mouth 2 (two) times daily. -     predniSONE (DELTASONE) 5 MG tablet; Take 1 tablet (5 mg total) by mouth daily with breakfast. 6-5-4-3-2-1-off -     guaiFENesin-codeine (CHERATUSSIN AC) 100-10 MG/5ML syrup; Take 10 mLs by mouth at bedtime as needed for cough or congestion. -     POC Influenza A&B(BINAX/QUICKVUE)  Influenza-like illness   . Reviewed expectations re: course of current medical issues. . Discussed  self-management of symptoms. . Outlined signs and symptoms indicating need for more acute intervention. . Patient verbalized understanding and all questions were answered. Marland Kitchen Health Maintenance issues including appropriate healthy diet, exercise, and smoking avoidance were discussed with patient. . See orders for this visit as documented in the electronic medical record. . Patient received an After Visit Summary.   [name], DO [place] 05/22/2017  Future Appointments Date Time Provider Department Center  01/20/2018 2:30 PM Salvadore Dom, MD [place] None

## 2017-05-22 NOTE — Telephone Encounter (Signed)
Call pharmacy as soon as possible, the patient is at the office waiting. The PULMICORT is not covered by insurance. I attempted to call the nurse however, no answer. Call to advise and approve another medication.

## 2017-05-22 NOTE — Telephone Encounter (Signed)
[facility] and changed prescription to [id number], verbal order given per Dr Juleen China.

## 2017-05-25 MED FILL — GABAPENTIN 300 MG CAPSULE: 300 | 30 days supply | Qty: 90 | Fill #5

## 2017-05-25 MED FILL — MONTELUKAST SOD 10 MG TAB: 10 | 90 days supply | Qty: 90 | Fill #1

## 2017-05-25 MED FILL — FLUoxetine HCL 20 MG CAPS: 20 | 90 days supply | Qty: 90 | Fill #1

## 2017-06-01 ENCOUNTER — Other Ambulatory Visit: Payer: Self-pay | Admitting: Family Medicine

## 2017-06-01 ENCOUNTER — Other Ambulatory Visit: Payer: Self-pay | Admitting: Surgical

## 2017-06-01 ENCOUNTER — Ambulatory Visit (HOSPITAL_COMMUNITY)
Admission: RE | Admit: 2017-06-01 | Discharge: 2017-06-01 | Disposition: A | Discharge status: Home / Self Care | Payer: 59 | Source: Ambulatory Visit | Attending: Family Medicine | Admitting: Family Medicine

## 2017-06-01 ENCOUNTER — Inpatient Hospital Stay: Admission: RE | Admit: 2017-06-01 | Payer: 59 | Source: Ambulatory Visit

## 2017-06-01 DIAGNOSIS — R079 Chest pain, unspecified: Secondary | ICD-10-CM | POA: Insufficient documentation

## 2017-06-01 DIAGNOSIS — J439 Emphysema, unspecified: Secondary | ICD-10-CM | POA: Diagnosis not present

## 2017-06-01 DIAGNOSIS — I700 Atherosclerosis of aorta: Secondary | ICD-10-CM | POA: Diagnosis not present

## 2017-06-01 LAB — POCT I-STAT CREATININE: Creatinine, Ser: 0.7 mg/dL (ref 0.44–1.00)

## 2017-06-01 MED ORDER — HYDROCODONE-ACETAMINOPHEN 10-325 MG PO TABS: 1.0000 | ORAL_TABLET | Freq: Three times a day (TID) | ORAL | 0 refills | Status: DC | PRN

## 2017-06-01 MED ORDER — IOPAMIDOL (ISOVUE-370) INJECTION 76%
INTRAVENOUS | Status: AC
  Administered 2017-06-01: 100 mL
  Filled 2017-06-01: qty 100

## 2017-06-01 MED FILL — HYDROCODON-APAP 10-325: 10-325 | 2 days supply | Qty: 10 | Fill #0

## 2017-06-01 NOTE — Progress Notes (Signed)
Patient still with severe pain in left periscapular region. No cough. Will order CT.

## 2017-06-19 MED FILL — SYNTHROID 88 MCG TABLET: 88 | 90 days supply | Qty: 90 | Fill #1

## 2017-06-19 MED FILL — GABAPENTIN 300 MG CAPSULE: 300 | 30 days supply | Qty: 90 | Fill #6

## 2017-06-19 MED FILL — PREMARIN 0.625 MG TABLET: 0.625 | 90 days supply | Qty: 90 | Fill #2

## 2017-07-08 ENCOUNTER — Other Ambulatory Visit: Payer: Self-pay | Admitting: Family Medicine

## 2017-07-08 MED ORDER — LINACLOTIDE 290 MCG PO CAPS: 290.0000 ug | ORAL_CAPSULE | Freq: Every day | ORAL | 3 refills | Status: DC

## 2017-07-08 MED FILL — LINZESS 290 MCG CAPSULE: 290 | 30 days supply | Qty: 30 | Fill #0

## 2017-07-29 ENCOUNTER — Ambulatory Visit: Payer: 59 | Admitting: Family Medicine

## 2017-07-29 ENCOUNTER — Encounter: Payer: Self-pay | Admitting: Family Medicine

## 2017-07-29 VITALS — BP 118/82 | HR 83 | Temp 98.3°F | Ht 62.5 in | Wt 145.8 lb

## 2017-07-29 DIAGNOSIS — J111 Influenza due to unidentified influenza virus with other respiratory manifestations: Secondary | ICD-10-CM

## 2017-07-29 DIAGNOSIS — R05 Cough: Secondary | ICD-10-CM

## 2017-07-29 DIAGNOSIS — R69 Illness, unspecified: Principal | ICD-10-CM

## 2017-07-29 DIAGNOSIS — J392 Other diseases of pharynx: Secondary | ICD-10-CM | POA: Diagnosis not present

## 2017-07-29 DIAGNOSIS — H73891 Other specified disorders of tympanic membrane, right ear: Secondary | ICD-10-CM

## 2017-07-29 MED ORDER — DOXYCYCLINE HYCLATE 100 MG PO TABS: 100.0000 mg | ORAL_TABLET | Freq: Two times a day (BID) | ORAL | 0 refills | Status: DC

## 2017-07-29 MED ORDER — PREDNISONE 5 MG PO TABS: 5.0000 mg | ORAL_TABLET | Freq: Every day | ORAL | 0 refills | Status: DC

## 2017-07-29 MED ORDER — GUAIFENESIN-CODEINE 100-10 MG/5ML PO SYRP: 10.0000 mL | ORAL_SOLUTION | Freq: Every evening | ORAL | 0 refills | Status: DC | PRN

## 2017-07-29 MED ORDER — OSELTAMIVIR PHOSPHATE 75 MG PO CAPS: 75.0000 mg | ORAL_CAPSULE | Freq: Two times a day (BID) | ORAL | 0 refills | Status: AC

## 2017-07-29 MED FILL — DOXYCYCLINE HYCLATE 100 MG: 100 | 10 days supply | Qty: 20 | Fill #0

## 2017-07-29 MED FILL — predniSONE 5 MG (21) TBPK: 5 | 6 days supply | Qty: 21 | Fill #0

## 2017-07-29 MED FILL — OSELTAMIVIR PHOS 75 MG CAP: 75 | 5 days supply | Qty: 10 | Fill #0

## 2017-07-29 MED FILL — GUAIATUSSIN AC LIQUID: 100-10 | 12 days supply | Qty: 120 | Fill #0

## 2017-07-29 NOTE — Progress Notes (Signed)
Kara Kennedy is a 62 y.o. female here for an acute visit.  History of Present Illness:   [name] CMA acting as scribe for Dr. Juleen China.  Sinus Problem  This is a new problem. The current episode started yesterday. The problem is unchanged. There has been no fever. Her pain is at a severity of 6/10. The pain is mild. Associated symptoms include coughing, ear pain, headaches, sinus pressure and a sore throat. Past treatments include oral decongestants. The treatment provided mild relief.   PMHx, SurgHx, SocialHx, Medications, and Allergies were reviewed in the Visit Navigator and updated as appropriate.  Current Medications:   .  ALPRAZolam (XANAX) 0.5 MG tablet, Take by mouth., Disp: , Rfl:  .  calcium carbonate (TUMS - DOSED IN MG ELEMENTAL CALCIUM) 500 MG chewable tablet, Chew 1 tablet by mouth daily., Disp: , Rfl:  .  Cholecalciferol (VITAMIN D3) 50000 units CAPS, , Disp: , Rfl: 0 .  Cholecalciferol 50000 units TABS, Take 1 tablet by mouth once a week., Disp: 12 tablet, Rfl: 0 .  Colchicine (MITIGARE) 0.6 MG CAPS, Take 1 capsule by mouth 2 (two) times daily as needed., Disp: 15 capsule, Rfl: 0 .  docusate sodium (COLACE) 100 MG capsule, Take 100 mg by mouth 2 (two) times daily., Disp: , Rfl:  .  estrogens, conjugated, (PREMARIN) 0.45 MG tablet, Take 1 tablet (0.45 mg total) by mouth daily. Take daily for 21 days then do not take for 7 days., Disp: 90 tablet, Rfl: 3 .  FLUoxetine (PROZAC) 20 MG capsule, Take 1 capsule (20 mg total) by mouth daily., Disp: 90 capsule, Rfl: 1 .  gabapentin (NEURONTIN) 300 MG capsule, Take by mouth., Disp: , Rfl:  .  HYDROcodone-acetaminophen (NORCO) 10-325 MG tablet, Take 1 tablet every 8 (eight) hours as needed by mouth., Disp: 10 tablet, Rfl: 0 .  levothyroxine (SYNTHROID) 88 MCG tablet, TAKE 1 TABLET BY MOUTH EVERY MORNING, Disp: , Rfl:  .  linaclotide (LINZESS) 290 MCG CAPS capsule, Take 1 capsule (290 mcg total) by mouth daily before breakfast.,  Disp: 90 capsule, Rfl: 3 .  montelukast (SINGULAIR) 10 MG tablet, Take 1 tablet (10 mg total) by mouth daily., Disp: 90 tablet, Rfl: 3 .  PREMARIN 0.625 MG tablet, , Disp: , Rfl: 3   Allergies  Allergen Reactions  . Sulfamethoxazole-Trimethoprim    Review of Systems:   Pertinent items are noted in the HPI. Otherwise, ROS is negative.  Vitals:   Vitals:   07/29/17 1329  BP: 118/82  Pulse: 83  Temp: 98.3 F (36.8 C)  SpO2: 96%  Weight: 145 lb 12.8 oz (66.1 kg)  Height: 5' 2.5" (1.588 m)     Body mass index is 26.24 kg/m.  Physical Exam:   Physical Exam  Constitutional: She appears well-developed and well-nourished. No distress.  HENT:  Head: Normocephalic and atraumatic.  Right Ear: Tympanic membrane is injected.  Left Ear: Tympanic membrane normal.  Mouth/Throat: Posterior oropharyngeal edema present.  Eyes: EOM are normal. Pupils are equal, round, and reactive to light.  Neck: Normal range of motion. Neck supple.  Cardiovascular: Normal rate, regular rhythm, normal heart sounds and intact distal pulses.  Pulmonary/Chest: Effort normal.  Cough.  Abdominal: Soft.  Skin: Skin is warm.  Psychiatric: She has a normal mood and affect. Her behavior is normal.  Nursing note and vitals reviewed.   Assessment and Plan:   1. Influenza-like illness - oseltamivir (TAMIFLU) 75 MG capsule; Take 1 capsule (75 mg total)  by mouth 2 (two) times daily for 5 days.  Dispense: 10 capsule; Refill: 0 - predniSONE (DELTASONE) 5 MG tablet; Take 1 tablet (5 mg total) by mouth daily with breakfast. 6-5-4-3-2-1-off  Dispense: 21 tablet; Refill: 0  . Reviewed expectations re: course of current medical issues. . Discussed self-management of symptoms. . Outlined signs and symptoms indicating need for more acute intervention. . Patient verbalized understanding and all questions were answered. Marland Kitchen Health Maintenance issues including appropriate healthy diet, exercise, and smoking avoidance were  discussed with patient. . See orders for this visit as documented in the electronic medical record. . Patient received an After Visit Summary.  CMA served as Education administrator during this visit. History, Physical, and Plan performed by medical provider. The above documentation has been reviewed and is accurate and complete. [name], D.O.  [name], DO [place] 07/29/2017

## 2017-08-05 ENCOUNTER — Other Ambulatory Visit: Payer: Self-pay

## 2017-08-05 MED ORDER — LINACLOTIDE 290 MCG PO CAPS: 290.0000 ug | ORAL_CAPSULE | Freq: Every day | ORAL | 3 refills | Status: DC

## 2017-08-05 MED FILL — LINZESS 290 MCG CAPSULE: 290 | 90 days supply | Qty: 90 | Fill #0

## 2017-08-11 ENCOUNTER — Other Ambulatory Visit: Payer: Self-pay | Admitting: Family Medicine

## 2017-08-11 MED ORDER — GABAPENTIN 300 MG PO CAPS: 300.0000 mg | ORAL_CAPSULE | Freq: Three times a day (TID) | ORAL | 2 refills | Status: DC

## 2017-08-11 MED FILL — GABAPENTIN 300 MG CAPS: 300 | 90 days supply | Qty: 270 | Fill #0

## 2017-08-18 MED FILL — MONTELUKAST SOD 10 MG TAB: 10 | 90 days supply | Qty: 90 | Fill #2

## 2017-08-24 ENCOUNTER — Encounter: Payer: Self-pay | Admitting: Sports Medicine

## 2017-08-24 ENCOUNTER — Ambulatory Visit (INDEPENDENT_AMBULATORY_CARE_PROVIDER_SITE_OTHER): Payer: 59 | Admitting: Sports Medicine

## 2017-08-24 VITALS — BP 122/78 | HR 70 | Ht 62.5 in | Wt 146.0 lb

## 2017-08-24 DIAGNOSIS — M25511 Pain in right shoulder: Secondary | ICD-10-CM | POA: Diagnosis not present

## 2017-08-24 DIAGNOSIS — M75101 Unspecified rotator cuff tear or rupture of right shoulder, not specified as traumatic: Secondary | ICD-10-CM

## 2017-08-24 NOTE — Procedures (Signed)
PROCEDURE NOTE:  Right Subacromial Injection  DESCRIPTION OF PROCEDURE:  The patient's clinical condition is marked by substantial pain and/or significant functional disability. Other conservative therapy has not provided relief, is contraindicated, or not appropriate. There is a reasonable likelihood that injection will significantly improve the patient's pain and/or functional impairment.  After discussing the risks, benefits and expected outcomes of the injection and all questions were reviewed and answered, the patient wished to undergo the above named procedure. Verbal consent was obtained.  The skin was then prepped in sterile fashion and the target structure was injected using sterile technique as below:  PREP: Alcohol, Ethel Chloride APPROACH: Posterior, single injection, 22g 1.5in. INJECTATE: 3 cc 1% lidocaine, 1cc 40mg /mL DepoMedrol   ASPIRATE: None   DRESSING: Band-Aid   Post procedural instructions including recommending icing and warning signs for infection were reviewed.  This procedure was well tolerated and there were no complications.

## 2017-08-24 NOTE — Procedures (Signed)
PROCEDURE NOTE: THERAPEUTIC EXERCISES (97110) 15 minutes spent for Therapeutic exercises as below and as referenced in the AVS. This included exercises focusing on stretching, strengthening, with significant focus on eccentric aspects.  Proper technique shown and discussed handout in great detail with ATC. All questions were discussed and answered.   Long term goals include an improvement in range of motion, strength, endurance as well as avoiding reinjury. Frequency of visits is one time as determined during today's  office visit. Frequency of exercises to be performed is as per handout.  EXERCISES REVIEWED:  Intrinsic rotator cuff strengthening  Scapular stabilization

## 2017-08-24 NOTE — Progress Notes (Signed)
Kara Kennedy. Kara Kennedy, [facility] [place]  Kara Kennedy - 62 y.o. female MRN [id number]  Date of birth: 1956-02-15  Visit Date: 08/24/2017  PCP: [name], DO   Referred by: [name], DO   Scribe for today's visit: Josepha Pigg, CMA     SUBJECTIVE:  Kara Kennedy is here for RT shoulder pain  Her RT shoulder pain symptoms INITIALLY: Began about 1 month ago and MOI is unknown. Described as moderate aching, radiating to the RT arm but does not go past elbow. No loss of grip strength.  Worsened with reaching back, down and back, across the body. Some pain when reaching up.  Improved with arm straight by side.  Additional associated symptoms include: Pain is anterior. No swelling. Decreased ROM.     At this time symptoms are worsening compared to onset  She has been taking Tylenol and IBU with some relief. IBU causes GI upset.    ROS Reports night time disturbances. Denies fevers, chills, or night sweats. Denies unexplained weight loss. Denies personal history of cancer. Denies changes in bowel or bladder habits. Denies recent unreported falls. Denies new or worsening dyspnea or wheezing. Denies headaches or dizziness.  Denies numbness, tingling or weakness  In the extremities.  Denies dizziness or presyncopal episodes Denies lower extremity edema     HISTORY & PERTINENT PRIOR DATA:  Prior History reviewed and updated per electronic medical record.  Significant history, findings, studies and interim changes include:  reports that she has quit smoking. she has never used smokeless tobacco. Recent Labs    12/29/16 1054  LABURIC 5.0   No specialty comments available. Problem  Rotator Cuff Syndrome of Right Shoulder    OBJECTIVE:  VS:  HT:5' 2.5" (158.8 cm)   WT:146 lb (66.2 kg)  BMI:26.26    BP:122/78  HR:70bpm  TEMP: ( )  RESP:97 %   PHYSICAL EXAM: Constitutional: WDWN, Non-toxic  appearing. Psychiatric: Alert & appropriately interactive.  Not depressed or anxious appearing. Respiratory: No increased work of breathing.  Trachea Midline Eyes: Pupils are equal.  EOM intact without nystagmus.  No scleral icterus  NEUROVASCULAR exam: No clubbing or cyanosis appreciated No significant venous stasis changes Capillary Refill: normal, less than 2 seconds   UPPER Extremities LOWER Extremities  Generalized UE Edema: No significant edema Radial Pulses: Normal & symmetrically palpable Dermatomes intact to light touch Normal strength in all myotomes Reflexes: Normal & symmetric DTRs Pre-tibial edema: No significant pretibial edema Pedal Pulses: Normal & symmetrically palpable Dermatomes intact to light touch Normal strength in all myotomes Reflexes: Normal & symmetric DTRs   Right shoulder overall well aligned with no significant deformity.  Slightly atrophic musculature.  She has good range of motion with no significant restrictions.  Small amount of pain with Hawkins and Neer's. No significant crepitation with axial load and circumduction. No additional findings.   ASSESSMENT & PLAN:   1. Acute pain of right shoulder   2. Rotator cuff syndrome of right shoulder   3. Pain in joint of right shoulder    PLAN:    Rotator cuff syndrome of right shoulder Fairly classic rotator cuff impingement symptoms.  Will start with subacromial injection and therapeutic exercises per procedure notes follow-up in 6 weeks and if any lack of improvement or worsening symptoms can consider further diagnostic imaging plus minus nitroglycerin protocol   ++++++++++++++++++++++++++++++++++++++++++++ Orders & Meds: Orders Placed This Encounter  Procedures  . Large  Joint Injection/Arthrocentesis  . Misc procedure    No orders of the defined types were placed in this encounter.   ++++++++++++++++++++++++++++++++++++++++++++ Follow-up: Return in about 6 weeks (around 10/05/2017).    Pertinent documentation may be included in additional procedure notes, imaging studies, problem based documentation and patient instructions. Please see these sections of the encounter for additional information regarding this visit. CMA/ATC served as Education administrator during this visit. History, Physical, and Plan performed by medical provider. Documentation and orders reviewed and attested to.      [name], [facility] Physician

## 2017-08-24 NOTE — Patient Instructions (Addendum)

## 2017-08-25 DIAGNOSIS — M75101 Unspecified rotator cuff tear or rupture of right shoulder, not specified as traumatic: Secondary | ICD-10-CM | POA: Insufficient documentation

## 2017-08-25 NOTE — Assessment & Plan Note (Signed)
Fairly classic rotator cuff impingement symptoms.  Will start with subacromial injection and therapeutic exercises per procedure notes follow-up in 6 weeks and if any lack of improvement or worsening symptoms can consider further diagnostic imaging plus minus nitroglycerin protocol

## 2017-08-28 ENCOUNTER — Other Ambulatory Visit: Payer: Self-pay | Admitting: Family Medicine

## 2017-08-28 MED FILL — FLUoxetine HCL 20 MG CAPS: 20 | 90 days supply | Qty: 90 | Fill #0

## 2017-09-11 ENCOUNTER — Other Ambulatory Visit: Payer: Self-pay

## 2017-09-11 ENCOUNTER — Telehealth: Payer: Self-pay

## 2017-09-11 ENCOUNTER — Other Ambulatory Visit: Payer: 59

## 2017-09-11 DIAGNOSIS — Z111 Encounter for screening for respiratory tuberculosis: Secondary | ICD-10-CM

## 2017-09-11 NOTE — Telephone Encounter (Signed)
Pt on lab schedule for this morning, 09/11/17. Please place future lab orders. Thank you.

## 2017-09-11 NOTE — Telephone Encounter (Signed)
Put it in thanks

## 2017-09-18 ENCOUNTER — Other Ambulatory Visit (INDEPENDENT_AMBULATORY_CARE_PROVIDER_SITE_OTHER): Payer: 59 | Admitting: Radiology

## 2017-09-18 DIAGNOSIS — Z111 Encounter for screening for respiratory tuberculosis: Secondary | ICD-10-CM

## 2017-09-20 LAB — QUANTIFERON-TB GOLD PLUS
Mitogen-NIL: 9.24 IU/mL
NIL: 0.02 IU/mL
QuantiFERON-TB Gold Plus: NEGATIVE
TB1-NIL: 0 IU/mL
TB2-NIL: 0.01 IU/mL

## 2017-10-05 ENCOUNTER — Ambulatory Visit: Payer: 59 | Admitting: Sports Medicine

## 2017-10-05 MED FILL — PREMARIN 0.625 MG TABLET: 0.625 | 90 days supply | Qty: 90 | Fill #3

## 2017-10-26 ENCOUNTER — Ambulatory Visit (INDEPENDENT_AMBULATORY_CARE_PROVIDER_SITE_OTHER): Payer: 59

## 2017-10-26 ENCOUNTER — Ambulatory Visit: Payer: 59 | Admitting: Sports Medicine

## 2017-10-26 ENCOUNTER — Encounter: Payer: Self-pay | Admitting: Sports Medicine

## 2017-10-26 VITALS — BP 110/80 | HR 74 | Ht 62.5 in | Wt 147.6 lb

## 2017-10-26 DIAGNOSIS — M79601 Pain in right arm: Secondary | ICD-10-CM

## 2017-10-26 DIAGNOSIS — M47812 Spondylosis without myelopathy or radiculopathy, cervical region: Secondary | ICD-10-CM | POA: Insufficient documentation

## 2017-10-26 DIAGNOSIS — M75101 Unspecified rotator cuff tear or rupture of right shoulder, not specified as traumatic: Secondary | ICD-10-CM

## 2017-10-26 DIAGNOSIS — M4722 Other spondylosis with radiculopathy, cervical region: Secondary | ICD-10-CM

## 2017-10-26 MED ORDER — METHYLPREDNISOLONE 4 MG PO TBPK: ORAL_TABLET | ORAL | 0 refills | Status: DC

## 2017-10-26 MED FILL — METHYLPREDNISOLONE 4 MG TAB: 4 | 6 days supply | Qty: 21 | Fill #0

## 2017-10-26 NOTE — Progress Notes (Signed)
Kara Kennedy. Kara Kennedy, Kara Kennedy  Kara Kennedy - 62 y.o. female MRN [id number]  Date of birth: 1955/12/08  Visit Date: 10/26/2017  PCP: [name], DO   Referred by: [name], DO  Scribe for today's visit: Josepha Pigg, CMA     SUBJECTIVE:  Kara Kennedy is here for Follow-up (R shoulder pain)  08/24/17: Her RT shoulder pain symptoms INITIALLY: Began about 1 month ago and MOI is unknown. Described as moderate aching, radiating to the RT arm but does not go past elbow. No loss of grip strength.  Worsened with reaching back, down and back, across the body. Some pain when reaching up.  Improved with arm straight by side.  Additional associated symptoms include: Pain is anterior. No swelling. Decreased ROM.  At this time symptoms are worsening compared to onset  She has been taking Tylenol and IBU with some relief. IBU causes GI upset.   10/26/2017: Compared to the last office visit, her previously described symptoms show no change. Current symptoms are moderate & radiates into the R arm and wrist. Pain is severe at night. She only gets relief when she is lying on the R shoulder/side.  She has been taking Tylenol with some relief during the day.  She had steroid injection 09/24/2017 for shoulder pain.   ROS Reports night time disturbances. Denies fevers, chills, or night sweats. Denies unexplained weight loss. Denies personal history of cancer. Denies changes in bowel or bladder habits. Denies recent unreported falls. Denies new or worsening dyspnea or wheezing. Denies headaches or dizziness.  Denies numbness, tingling or weakness  In the extremities.  Denies dizziness or presyncopal episodes Denies lower extremity edema    HISTORY & PERTINENT PRIOR DATA:  Prior History reviewed and updated per electronic medical record.  Significant/pertinent history, findings, studies include:  reports that she  has quit smoking. She has never used smokeless tobacco. Recent Labs    12/29/16 1054  LABURIC 5.0   No specialty comments available. Problem  Cervical Spondylosis   Status post 2 level fusion at C5-6 and C6-7, she does have some adjacent level disease on x-rays obtained 10/26/2017     OBJECTIVE:  VS:  HT:5' 2.5" (158.8 cm)   WT:147 lb 9.6 oz (67 kg)  BMI:26.55    BP:110/80  HR:74bpm  TEMP: ( )  RESP:97 %   PHYSICAL EXAM: Constitutional: WDWN, Non-toxic appearing. Psychiatric: Alert & appropriately interactive.  Not depressed or anxious appearing. Respiratory: No increased work of breathing.  Trachea Midline Eyes: Pupils are equal.  EOM intact without nystagmus.  No scleral icterus  Vascular Exam: warm to touch no edema  upper extremity neuro exam: unremarkable normal strength normal sensation Slightly increased C7/Triceps reflex on the right + arm squeeze and brachial plexus squeeze test on the right  MSK Exam: Mildly limited cervical range of motion.  Full overhead range of motion of the shoulder.  She has intact rotator cuff strength testing with mild pain with empty can testing but this is minimal.  No significant pain with axial load and circumduction.  No focal bony tenderness.   ASSESSMENT & PLAN:   1. Right arm pain   2. Rotator cuff syndrome of right shoulder   3. Osteoarthritis of spine with radiculopathy, cervical region     PLAN: Symptoms are likely most consistent with cervical spondylosis with underlying scapular dyskinesis and underlying rotator cuff impingement syndrome.  She should continue with the  home therapeutic exercises previously prescribed but I like to start her on a steroid Dosepak and see how she does with this.  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  We will plan to have her follow-up in 6 weeks to ensure clinical resolution and if  any lack of improvement could consider return to neurosurgery versus further diagnostic evaluation of the shoulder but skin she does have signs of neural tension on exam and likely has underlying issues with adjacent level disease.  Can consider changing to Lyrica as well she is currently on gabapentin 900 mg.  Follow-up: Return in about 6 weeks (around 12/07/2017).      Please see additional documentation for Objective, Assessment and Plan sections. Pertinent additional documentation may be included in corresponding procedure notes, imaging studies, problem based documentation and patient instructions. Please see these sections of the encounter for additional information regarding this visit.  CMA/ATC served as Education administrator during this visit. History, Physical, and Plan performed by medical provider. Documentation and orders reviewed and attested to.      [name], [place] [facility] Physician

## 2017-10-26 NOTE — Procedures (Signed)
X-Rays obtained at [facility] Interpreted by myself [name], DO) during office visit.  Results were reviewed with the patient at the time of the visit.   2 VIEW X-RAY of:  Cervical spine  FINDINGS:  Overall well aligned.  Status post C5-6 C6-7 two-level fusion.  She has degenerative spurring at the adjacent levels no appreciable loosening of the hardware. IMPRESSION:  Multilevel cervical spondylosis with prior C5-6 C6-7 two-level fusion that appears to be intact. We will adjacent level disease   3 View X-ray of: Right Shoulder  FINDINGS:  Well aligned, mild degenerative spurring of the glenohumeral joint with osteophytic changes of the inferior glenoid.  Type II acromion IMPRESSION:  Mild degenerative changes with well-preserved subacromial space and overall negative x-ray

## 2017-11-01 MED FILL — LINZESS 290 MCG CAPSULE: 290 | 90 days supply | Qty: 90 | Fill #1

## 2017-11-02 ENCOUNTER — Ambulatory Visit: Payer: 59 | Admitting: Family Medicine

## 2017-11-02 ENCOUNTER — Encounter: Payer: Self-pay | Admitting: Family Medicine

## 2017-11-02 VITALS — BP 120/74 | HR 82 | Temp 98.6°F | Ht 62.5 in | Wt 145.8 lb

## 2017-11-02 DIAGNOSIS — J441 Chronic obstructive pulmonary disease with (acute) exacerbation: Secondary | ICD-10-CM | POA: Diagnosis not present

## 2017-11-02 MED ORDER — BUDESONIDE-FORMOTEROL FUMARATE 160-4.5 MCG/ACT IN AERO: 2.0000 | INHALATION_SPRAY | Freq: Two times a day (BID) | RESPIRATORY_TRACT | 3 refills | Status: DC

## 2017-11-02 MED ORDER — GUAIFENESIN-CODEINE 100-10 MG/5ML PO SYRP: 10.0000 mL | ORAL_SOLUTION | Freq: Every evening | ORAL | 0 refills | Status: DC | PRN

## 2017-11-02 MED ORDER — BENZONATATE 200 MG PO CAPS: 200.0000 mg | ORAL_CAPSULE | Freq: Two times a day (BID) | ORAL | 0 refills | Status: DC | PRN

## 2017-11-02 MED ORDER — AMOXICILLIN-POT CLAVULANATE 875-125 MG PO TABS: 1.0000 | ORAL_TABLET | Freq: Two times a day (BID) | ORAL | 0 refills | Status: DC

## 2017-11-02 MED FILL — BENZONATATE 200 MG CAPS: 200 | 10 days supply | Qty: 20 | Fill #0

## 2017-11-02 MED FILL — GUAIATUSSIN AC LIQUID: 100-10 | 12 days supply | Qty: 120 | Fill #0

## 2017-11-02 MED FILL — AMOX TR-K CLV 875-125 MG TA: 875-125 | 10 days supply | Qty: 20 | Fill #0

## 2017-11-02 NOTE — Progress Notes (Signed)
Kara Kennedy is a 62 y.o. female here for an acute visit.  History of Present Illness:   Kara Kennedy, CMA acting as scribe for Dr. [name].   HPI: Patient in today for cough that started five days ago. No fever that she is aware but she has had chills today. Cough is productive with yellow mucus. She has not tried anything over the counter. CT with COPD last year. Nonsmoker.   PMHx, SurgHx, SocialHx, Medications, and Allergies were reviewed in the Visit Navigator and updated as appropriate.  Current Medications:   .  calcium carbonate (TUMS - DOSED IN MG ELEMENTAL CALCIUM) 500 MG chewable tablet, Chew 1 tablet by mouth daily., Disp: , Rfl:  .  Cholecalciferol (VITAMIN D3) 50000 units CAPS, , Disp: , Rfl: 0 .  Cholecalciferol 50000 units TABS, Take 1 tablet by mouth once a week., Disp: 12 tablet, Rfl: 0 .  Colchicine (MITIGARE) 0.6 MG CAPS, Take 1 capsule by mouth 2 (two) times daily as needed., Disp: 15 capsule, Rfl: 0 .  docusate sodium (COLACE) 100 MG capsule, Take 100 mg by mouth 2 (two) times daily., Disp: , Rfl:  .  FLUoxetine (PROZAC) 20 MG capsule, TAKE 1 CAPSULE BY MOUTH ONCE DAILY, Disp: 90 capsule, Rfl: 1 .  gabapentin (NEURONTIN) 300 MG capsule, Take 1 capsule (300 mg total) by mouth 3 (three) times daily. (Patient taking differently: Take 900 mg by mouth at bedtime. ), Disp: 270 capsule, Rfl: 2 .  levothyroxine (SYNTHROID) 88 MCG tablet, TAKE 1 TABLET BY MOUTH EVERY MORNING, Disp: , Rfl:  .  linaclotide (LINZESS) 290 MCG CAPS capsule, Take 1 capsule (290 mcg total) by mouth daily before breakfast., Disp: 90 capsule, Rfl: 3 .  methylPREDNISolone (MEDROL DOSEPAK) 4 MG TBPK tablet, Take by mouth as directed. Take 6 tablets on the first day prescribed then as directed., Disp: 21 tablet, Rfl: 0 .  montelukast (SINGULAIR) 10 MG tablet, Take 1 tablet (10 mg total) by mouth daily., Disp: 90 tablet, Rfl: 3 .  PREMARIN 0.625 MG tablet, , Disp: , Rfl: 3   Allergies    Allergen Reactions  . Sulfamethoxazole-Trimethoprim    Review of Systems:   Pertinent items are noted in the HPI. Otherwise, ROS is negative.  Vitals:   Vitals:   11/02/17 1446  BP: 120/74  Pulse: 82  Temp: 98.6 F (37 C)  TempSrc: Oral  SpO2: 96%  Weight: 145 lb 12.8 oz (66.1 kg)  Height: 5' 2.5" (1.588 m)     Body mass index is 26.24 kg/m.  Physical Exam:   Physical Exam  Constitutional: She appears well-developed and well-nourished. No distress.  HENT:  Head: Normocephalic and atraumatic.  Eyes: Pupils are equal, round, and reactive to light. EOM are normal.  Neck: Normal range of motion. Neck supple.  Cardiovascular: Normal rate, regular rhythm, normal heart sounds and intact distal pulses.  Pulmonary/Chest: Effort normal. No respiratory distress. She has wheezes.  Abdominal: Soft.  Skin: Skin is warm.  Psychiatric: She has a normal mood and affect. Her behavior is normal.  Nursing note and vitals reviewed.   Results for orders placed or performed in visit on 09/18/17  QuantiFERON-TB Gold Plus  Result Value Ref Range   QuantiFERON-TB Gold Plus NEGATIVE NEGATIVE   NIL 0.02 IU/mL   Mitogen-NIL 9.24 IU/mL   TB1-NIL 0.00 IU/mL   TB2-NIL 0.01 IU/mL   Assessment and Plan:   1. COPD with acute exacerbation (HCC) - benzonatate (TESSALON) 200 MG capsule;  Take 1 capsule (200 mg total) by mouth 2 (two) times daily as needed for cough.  Dispense: 20 capsule; Refill: 0 - amoxicillin-clavulanate (AUGMENTIN) 875-125 MG tablet; Take 1 tablet by mouth 2 (two) times daily.  Dispense: 20 tablet; Refill: 0 - guaiFENesin-codeine (CHERATUSSIN AC) 100-10 MG/5ML syrup; Take 10 mLs by mouth at bedtime as needed for cough or congestion.  Dispense: 120 mL; Refill: 0 - budesonide-formoterol (SYMBICORT) 160-4.5 MCG/ACT inhaler; Inhale 2 puffs into the lungs 2 (two) times daily.  Dispense: 1 Inhaler; Refill: 3   . Reviewed expectations re: course of current medical  issues. . Discussed self-management of symptoms. . Outlined signs and symptoms indicating need for more acute intervention. . Patient verbalized understanding and all questions were answered. Marland Kitchen Health Maintenance issues including appropriate healthy diet, exercise, and smoking avoidance were discussed with patient. . See orders for this visit as documented in the electronic medical record. . Patient received an After Visit Summary.  CMA served as Education administrator during this visit. History, Physical, and Plan performed by medical provider. The above documentation has been reviewed and is accurate and complete. [name], D.O.  [name], DO [place], [facility] 11/02/2017

## 2017-11-03 MED FILL — GABAPENTIN 300 MG CAPSULE: 300 | 90 days supply | Qty: 270 | Fill #1

## 2017-11-23 ENCOUNTER — Encounter: Payer: Self-pay | Admitting: Family Medicine

## 2017-11-23 ENCOUNTER — Ambulatory Visit: Payer: 59 | Admitting: Family Medicine

## 2017-11-23 VITALS — BP 120/74 | HR 72 | Temp 98.6°F | Ht 62.5 in | Wt 147.2 lb

## 2017-11-23 DIAGNOSIS — L989 Disorder of the skin and subcutaneous tissue, unspecified: Secondary | ICD-10-CM | POA: Diagnosis not present

## 2017-11-23 DIAGNOSIS — Z114 Encounter for screening for human immunodeficiency virus [HIV]: Secondary | ICD-10-CM | POA: Diagnosis not present

## 2017-11-23 DIAGNOSIS — C44622 Squamous cell carcinoma of skin of right upper limb, including shoulder: Secondary | ICD-10-CM | POA: Diagnosis not present

## 2017-11-23 DIAGNOSIS — Z1211 Encounter for screening for malignant neoplasm of colon: Secondary | ICD-10-CM

## 2017-11-23 DIAGNOSIS — Z1159 Encounter for screening for other viral diseases: Secondary | ICD-10-CM

## 2017-11-23 NOTE — Progress Notes (Signed)
Kara Kennedy is a 62 y.o. female here for an acute visit.  History of Present Illness:   Kara Kennedy, CMA acting as scribe for Dr. [name].   HPI: Patient has spot on right shoulder. Started out as bump about three weeks ago and has gotten worse.   PMHx, SurgHx, SocialHx, Medications, and Allergies were reviewed in the Visit Navigator and updated as appropriate.  Current Medications:   .  budesonide-formoterol (SYMBICORT) 160-4.5 MCG/ACT inhaler, Inhale 2 puffs into the lungs 2 (two) times daily., Disp: 1 Inhaler, Rfl: 3 .  calcium carbonate (TUMS - DOSED IN MG ELEMENTAL CALCIUM) 500 MG chewable tablet, Chew 1 tablet by mouth daily., Disp: , Rfl:  .  Cholecalciferol 50000 units TABS, Take 1 tablet by mouth once a week., Disp: 12 tablet, Rfl: 0 .  docusate sodium (COLACE) 100 MG capsule, Take 100 mg by mouth 2 (two) times daily., Disp: , Rfl:  .  FLUoxetine (PROZAC) 20 MG capsule, TAKE 1 CAPSULE BY MOUTH ONCE DAILY, Disp: 90 capsule, Rfl: 1 .  gabapentin (NEURONTIN) 300 MG capsule, Take by mouth., Disp: , Rfl:  .  levothyroxine (SYNTHROID) 88 MCG tablet, TAKE 1 TABLET BY MOUTH EVERY MORNING, Disp: , Rfl:  .  linaclotide (LINZESS) 290 MCG CAPS capsule, Take 1 capsule (290 mcg total) by mouth daily before breakfast., Disp: 90 capsule, Rfl: 3 .  montelukast (SINGULAIR) 10 MG tablet, Take 1 tablet (10 mg total) by mouth daily., Disp: 90 tablet, Rfl: 3 .  PREMARIN 0.625 MG tablet, , Disp: , Rfl: 3   Allergies  Allergen Reactions  . Sulfamethoxazole-Trimethoprim    Review of Systems:   Pertinent items are noted in the HPI. Otherwise, ROS is negative.  Vitals:   Vitals:   11/23/17 1325  BP: 120/74  Pulse: 72  Temp: 98.6 F (37 C)  TempSrc: Oral  SpO2: 97%  Weight: 147 lb 3.2 oz (66.8 kg)  Height: 5' 2.5" (1.588 m)     Body mass index is 26.49 kg/m.  Physical Exam:   Physical Exam  Skin: Rash noted. Rash is nodular.       Results for orders placed or  performed in visit on 09/18/17  QuantiFERON-TB Gold Plus  Result Value Ref Range   QuantiFERON-TB Gold Plus NEGATIVE NEGATIVE   NIL 0.02 IU/mL   Mitogen-NIL 9.24 IU/mL   TB1-NIL 0.00 IU/mL   TB2-NIL 0.01 IU/mL   Assessment and Plan:   1. Encounter for screening colonoscopy - Ambulatory referral to Gastroenterology  2. Skin lesion Procedure Note:   Procedure:  Skin biopsy Indication: Suspicious lesion(s)  Risks including unsuccessful procedure, bleeding, infection, bruising, scar, a need for another complete procedure and others were explained to the patient in detail as well as the benefits. Informed consent was obtained and signed.   The patient was placed in a decubitus position.  Lesion #1 on right shoulder measuring 4 mm  Skin over lesion #1  was prepped with Betadine and alcohol  and anesthetized with 1 cc of 2% lidocaine and epinephrine, using a 25-gauge 1 inch needle.  Shave biopsy with a sterile Dermablade was carried out in the usual fashion. Drysol was used for hemostasis. Band-Aid was applied with antibiotic ointment.  - Dermatology pathology  . Reviewed expectations re: course of current medical issues. . Discussed self-management of symptoms. . Outlined signs and symptoms indicating need for more acute intervention. . Patient verbalized understanding and all questions were answered. Marland Kitchen Health Maintenance issues including appropriate  healthy diet, exercise, and smoking avoidance were discussed with patient. . See orders for this visit as documented in the electronic medical record. . Patient received an After Visit Summary.  CMA served as Education administrator during this visit. History, Physical, and Plan performed by medical provider. The above documentation has been reviewed and is accurate and complete. [name], D.O.  [name], DO [place], [facility] 11/23/2017

## 2017-11-30 ENCOUNTER — Other Ambulatory Visit: Payer: Self-pay | Admitting: Surgical

## 2017-11-30 DIAGNOSIS — C44622 Squamous cell carcinoma of skin of right upper limb, including shoulder: Secondary | ICD-10-CM

## 2017-12-02 DIAGNOSIS — C44622 Squamous cell carcinoma of skin of right upper limb, including shoulder: Secondary | ICD-10-CM | POA: Diagnosis not present

## 2017-12-02 DIAGNOSIS — D225 Melanocytic nevi of trunk: Secondary | ICD-10-CM | POA: Diagnosis not present

## 2017-12-02 DIAGNOSIS — D2361 Other benign neoplasm of skin of right upper limb, including shoulder: Secondary | ICD-10-CM | POA: Diagnosis not present

## 2017-12-02 DIAGNOSIS — D220 Melanocytic nevi of lip: Secondary | ICD-10-CM | POA: Diagnosis not present

## 2017-12-03 ENCOUNTER — Other Ambulatory Visit: Payer: Self-pay

## 2017-12-03 MED ORDER — LEVOTHYROXINE SODIUM 88 MCG PO TABS: 88.0000 ug | ORAL_TABLET | Freq: Every morning | ORAL | 1 refills | Status: DC

## 2017-12-03 MED FILL — SYNTHROID 88 MCG TABLET: 88 | 90 days supply | Qty: 90 | Fill #0

## 2017-12-07 ENCOUNTER — Ambulatory Visit: Payer: Self-pay

## 2017-12-07 ENCOUNTER — Ambulatory Visit: Payer: 59 | Admitting: Sports Medicine

## 2017-12-07 ENCOUNTER — Encounter: Payer: Self-pay | Admitting: Sports Medicine

## 2017-12-07 VITALS — BP 116/68 | HR 74 | Ht 62.5 in | Wt 149.0 lb

## 2017-12-07 DIAGNOSIS — M25562 Pain in left knee: Secondary | ICD-10-CM

## 2017-12-07 NOTE — Procedures (Signed)
PROCEDURE NOTE:  Ultrasound Guided: Aspiration and Injection: Left bakers cyst Images were obtained and interpreted by myself, Teresa Coombs, DO  Images have been saved and stored to PACS system. Images obtained on: GE S7 Ultrasound machine    ULTRASOUND FINDINGS:  bakers cyst moderate in size no IA fluid  DESCRIPTION OF PROCEDURE:  The patient's clinical condition is marked by substantial pain and/or significant functional disability. Other conservative therapy has not provided relief, is contraindicated, or not appropriate. There is a reasonable likelihood that injection will significantly improve the patient's pain and/or functional impairment.   After discussing the risks, benefits and expected outcomes of the injection and all questions were reviewed and answered, the patient wished to undergo the above named procedure.  Verbal consent was obtained.  The ultrasound was used to identify the target structure and adjacent neurovascular structures. The skin was then prepped in sterile fashion and the target structure was injected under direct visualization using sterile technique as below:  PREP: Alcohol and Ethel Chloride APPROACH: posterior, stopcock technique, 21g 2 in. INJECTATE: 3 cc 1% lidocaine, 1 cc 0.5% Marcaine and 1 cc 80mg /mL DepoMedrol ASPIRATE: None DRESSING: Band-Aid and 6-inch Ace Wrap  Post procedural instructions including recommending icing and warning signs for infection were reviewed.    This procedure was well tolerated and there were no complications.   IMPRESSION: Succesful Ultrasound Guided: Aspiration and Injection

## 2017-12-07 NOTE — Progress Notes (Signed)
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 female MRN [id number]  Date of birth: 1955/08/18  Visit Date: 12/07/2017  PCP: [name], DO   Referred by: [name], DO  Scribe for today's visit: Josepha Pigg, CMA     SUBJECTIVE:  [name] is here for No chief complaint on file.  08/24/17: Her RT shoulder pain symptoms INITIALLY: Began about 1 month ago and MOI is unknown. Described as moderate aching, radiating to the RT arm but does not go past elbow. No loss of grip strength.  Worsened with reaching back, down and back, across the body. Some pain when reaching up.  Improved with arm straight by side.  Additional associated symptoms include: Pain is anterior. No swelling. Decreased ROM.  At this time symptoms are worsening compared to onset  She has been taking Tylenol and IBU with some relief. IBU causes GI upset.   10/26/2017: Compared to the last office visit, her previously described symptoms show no change. Current symptoms are moderate & radiates into the R arm and wrist. Pain is severe at night. She only gets relief when she is lying on the R shoulder/side.  She has been taking Tylenol with some relief during the day.  She had steroid injection 08/24/2017 for shoulder pain.   12/07/2017: Compared to the last office visit, her previously described symptoms are improving, minimal pain during the day.  Current symptoms are mild & are nonradiating. Pain is more bothersome at night.  She has been taking Tylenol with some relief.   She c/o L knee pain x 3 weeks. Pain is mostly posterior.  Pain is described as moderate-severe aching at its worst. Pain radiates into the calf.  Pain is worse when fully extending the knee or using stairs. Pain is also worse when first getting up.  She denies swelling around the knee. Improves at rest.  She has tried taking tylenol with minimal relief. She  has also been trying to keep the leg elevated.   No recent imaging.  ROS Denies night time disturbances. Denies fevers, chills, or night sweats. Denies unexplained weight loss. Denies personal history of cancer. Denies changes in bowel or bladder habits. Denies recent unreported falls. Denies new or worsening dyspnea or wheezing. Denies headaches or dizziness.  Denies numbness, tingling or weakness  In the extremities.  Denies dizziness or presyncopal episodes Reports lower extremity edema    HISTORY & PERTINENT PRIOR DATA:  Prior History reviewed and updated per electronic medical record.  Significant/pertinent history, findings, studies include:  reports that she has quit smoking. She has never used smokeless tobacco. Recent Labs    12/29/16 1054  LABURIC 5.0   No specialty comments available. No problems updated.  OBJECTIVE:  VS:  HT:5' 2.5" (158.8 cm)   WT:149 lb (67.6 kg)  BMI:26.8    BP:116/68  HR:74bpm  TEMP: ( )  RESP:97 %   PHYSICAL EXAM: Constitutional: WDWN, Non-toxic appearing. Psychiatric: Alert & appropriately interactive.  Not depressed or anxious appearing. Respiratory: No increased work of breathing.  Trachea Midline Eyes: Pupils are equal.  EOM intact without nystagmus.  No scleral icterus  Vascular Exam: warm to touch no edema  lower extremity neuro exam: unremarkable normal strength normal sensation normal reflexes  MSK Exam: Knee is overall well aligned with no significant effusion, small amount of synovitis.  Moderate sized Baker's cyst.  Ligamentously stable.  Negative McMurray's.  Range of motion limited.   ASSESSMENT & PLAN:   1. Acute pain of left knee     PLAN: Aspiration injection of Baker's cyst performed today.  She did have an acute flare of the following day progress note being completed and prescription for Mobic and Ultram was sent in.  Impression ice and rest recommended.  Follow-up: Return if symptoms worsen or fail  to improve.      Please see additional documentation for Objective, Assessment and Plan sections. Pertinent additional documentation may be included in corresponding procedure notes, imaging studies, problem based documentation and patient instructions. Please see these sections of the encounter for additional information regarding this visit.  CMA/ATC served as Education administrator during this visit. History, Physical, and Plan performed by medical provider. Documentation and orders reviewed and attested to.      [name], [place] [facility] Physician

## 2017-12-07 NOTE — Patient Instructions (Signed)

## 2017-12-08 MED ORDER — TRAMADOL HCL 50 MG PO TABS: 50.0000 mg | ORAL_TABLET | Freq: Four times a day (QID) | ORAL | 0 refills | Status: DC | PRN

## 2017-12-08 MED ORDER — MELOXICAM 15 MG PO TABS: 15.0000 mg | ORAL_TABLET | Freq: Every day | ORAL | 0 refills | Status: DC

## 2017-12-08 MED FILL — traMADol HCL 50 MG TABS: 50 | 7 days supply | Qty: 30 | Fill #0

## 2017-12-08 MED FILL — MELOXICAM 15 MG TABLET: 15 | 30 days supply | Qty: 30 | Fill #0

## 2017-12-15 ENCOUNTER — Other Ambulatory Visit: Payer: Self-pay

## 2017-12-15 MED ORDER — CEPHALEXIN 500 MG PO CAPS: 500.0000 mg | ORAL_CAPSULE | Freq: Three times a day (TID) | ORAL | 0 refills | Status: AC

## 2017-12-15 MED FILL — CEPHALEXIN 500 MG CAPSULE: 500 | 10 days supply | Qty: 30 | Fill #0

## 2017-12-15 NOTE — Progress Notes (Signed)
keflex

## 2017-12-16 ENCOUNTER — Other Ambulatory Visit: Payer: 59

## 2017-12-16 ENCOUNTER — Ambulatory Visit: Payer: Self-pay

## 2017-12-16 ENCOUNTER — Ambulatory Visit: Payer: 59 | Admitting: Physician Assistant

## 2017-12-16 ENCOUNTER — Encounter: Payer: Self-pay | Admitting: Physician Assistant

## 2017-12-16 VITALS — BP 126/75 | HR 73 | Ht 62.5 in | Wt 147.5 lb

## 2017-12-16 DIAGNOSIS — M79662 Pain in left lower leg: Secondary | ICD-10-CM

## 2017-12-16 MED ORDER — OXYCODONE-ACETAMINOPHEN 5-325 MG PO TABS: 1.0000 | ORAL_TABLET | ORAL | 0 refills | Status: AC | PRN

## 2017-12-16 MED ORDER — OXYCODONE-ACETAMINOPHEN 5-325 MG PO TABS: 1.0000 | ORAL_TABLET | ORAL | 0 refills | Status: DC | PRN

## 2017-12-16 MED FILL — OXYCODONE-ACETAMINOPHEN 5-3: 5-325 | 3 days supply | Qty: 20 | Fill #0

## 2017-12-16 NOTE — Patient Instructions (Addendum)
Please go to your appointment for your ultrasound. You will be contacted regaYou had an injection today.  Things to be aware of after injection are listed below: . You may experience no significant improvement or even a slight worsening in your symptoms during the first 24 to 48 hours.  After that we expect your symptoms to improve gradually over the next 2 weeks for the medicine to have its maximal effect.  You should continue to have improvement out to 6 weeks after your injection. . Dr. [name] recommends icing the site of the injection for 20 minutes  1-2 times the day of your injection . You may shower but no swimming, tub bath or Jacuzzi for 24 hours. . If your bandage falls off this does not need to be replaced.  It is appropriate to remove the bandage after 4 hours. . You may resume light activities as tolerated unless otherwise directed per Dr. [name] during your visit  POSSIBLE STEROID SIDE EFFECTS:  Side effects from injectable steroids tend to be less than when taken orally however you may experience some of the symptoms listed below.  If experienced these should only last for a short period of time. Change in menstrual flow  Edema (swelling)  Increased appetite Skin flushing (redness)  Skin rash/acne  Thrush (oral) Yeast vaginitis    Increased sweating  Depression Increased blood glucose levels Cramping and leg/calf  Euphoria (feeling happy)  POSSIBLE PROCEDURE SIDE EFFECTS: The side effects of the injection are usually fairly minimal however if you may experience some of the following side effects that are usually self-limited and will is off on their own.  If you are concerned please feel free to call the office with questions:  Increased numbness or tingling  Nausea or vomiting  Swelling or bruising at the injection site   Please call our office if if you experience any of the following symptoms over the next week as these can be signs of infection:   Fever greater than  100.101F  Significant swelling at the injection site  Significant redness or drainage from the injection site  If after 2 weeks you are continuing to have worsening symptoms please call our office to discuss what the next appropriate actions should be including the potential for a return office visit or other diagnostic testing.   rding the results.

## 2017-12-16 NOTE — Progress Notes (Signed)
Kara Kennedy is a 62 y.o. female here for a new problem.  I acted as a Education administrator for [employer], PA-C Anselmo Pickler, LPN  History of Present Illness:   Chief Complaint  Patient presents with  . Left Calf Pain    x 3 days, worsening - itching and rash    HPI She had a Baker's cyst that was aspirated on 12/07/17 by Dr. [name]. She had worsening pain the next day and was prescribed Mobic and Ultram. Rest and ice were recommended. Patient admits that she did not rest after this. Since that time, her pain has gradually worsened and the Mobic and Ultram have not been effective. Pain is aching and currently 8/10. She is unable to fully extend her leg without significant pain. Pain is also worsened with weight bearing. She endorses swelling with localized rash to her L calf since the aspiration. Rash is itchy. Denies numbness/tingling to LE. Denies CP, SOB, prior hx of blood clots or prior reaction to steroids.    Past Medical History:  Diagnosis Date  . Allergic rhinitis 07/30/2012  . Anxiety 03/06/2014  . Chronic neck pain 09/06/2012  . Endometriosis   . Family history of colon cancer 12/29/2016  . History of pyelonephritis 07/30/2012  . Hypothyroidism 03/06/2014  . IBS (irritable bowel syndrome) 12/29/2016  . Insomnia 08/19/2011  . Post menopausal syndrome 03/06/2014     Social History   Socioeconomic History  . Marital status: Married    Spouse name: Not on file  . Number of children: Not on file  . Years of education: Not on file  . Highest education level: Not on file  Occupational History  . Not on file  Social Needs  . Financial resource strain: Not on file  . Food insecurity:    Worry: Not on file    Inability: Not on file  . Transportation needs:    Medical: Not on file    Non-medical: Not on file  Tobacco Use  . Smoking status: Former Research scientist (life sciences)  . Smokeless tobacco: Never Used  Substance and Sexual Activity  . Alcohol use: Yes    Alcohol/week: 4.2 oz    Types: 7 Glasses  of wine per week  . Drug use: No  . Sexual activity: Yes    Partners: Male    Birth control/protection: Surgical  Lifestyle  . Physical activity:    Days per week: Not on file    Minutes per session: Not on file  . Stress: Not on file  Relationships  . Social connections:    Talks on phone: Not on file    Gets together: Not on file    Attends religious service: Not on file    Active member of club or organization: Not on file    Attends meetings of clubs or organizations: Not on file    Relationship status: Not on file  . Intimate partner violence:    Fear of current or ex partner: Not on file    Emotionally abused: Not on file    Physically abused: Not on file    Forced sexual activity: Not on file  Other Topics Concern  . Not on file  Social History Narrative  . Not on file    Past Surgical History:  Procedure Laterality Date  . ABDOMINAL HYSTERECTOMY    . CESAREAN SECTION    . DILATION AND CURETTAGE OF UTERUS    . GALLBLADDER SURGERY    . SPINE SURGERY  Family History  Problem Relation Age of Onset  . Cancer Mother   . Colon cancer Mother   . Diabetes Father   . Heart disease Father   . Cancer Maternal Grandmother   . Diabetes Paternal Grandfather     Allergies  Allergen Reactions  . Sulfamethoxazole-Trimethoprim     Current Medications:   Current Outpatient Medications:  .  budesonide-formoterol (SYMBICORT) 160-4.5 MCG/ACT inhaler, Inhale 2 puffs into the lungs 2 (two) times daily., Disp: 1 Inhaler, Rfl: 3 .  calcium carbonate (TUMS - DOSED IN MG ELEMENTAL CALCIUM) 500 MG chewable tablet, Chew 1 tablet by mouth daily., Disp: , Rfl:  .  cephALEXin (KEFLEX) 500 MG capsule, Take 1 capsule (500 mg total) by mouth 3 (three) times daily for 10 days., Disp: 30 capsule, Rfl: 0 .  Cholecalciferol 50000 units TABS, Take 1 tablet by mouth once a week., Disp: 12 tablet, Rfl: 0 .  docusate sodium (COLACE) 100 MG capsule, Take 100 mg by mouth 2 (two) times  daily., Disp: , Rfl:  .  FLUoxetine (PROZAC) 20 MG capsule, TAKE 1 CAPSULE BY MOUTH ONCE DAILY, Disp: 90 capsule, Rfl: 1 .  gabapentin (NEURONTIN) 300 MG capsule, Take by mouth., Disp: , Rfl:  .  levothyroxine (SYNTHROID) 88 MCG tablet, Take 1 tablet (88 mcg total) by mouth every morning., Disp: 90 tablet, Rfl: 1 .  linaclotide (LINZESS) 290 MCG CAPS capsule, Take 1 capsule (290 mcg total) by mouth daily before breakfast., Disp: 90 capsule, Rfl: 3 .  meloxicam (MOBIC) 15 MG tablet, Take 1 tablet (15 mg total) by mouth daily., Disp: 30 tablet, Rfl: 0 .  montelukast (SINGULAIR) 10 MG tablet, Take 1 tablet (10 mg total) by mouth daily., Disp: 90 tablet, Rfl: 3 .  PREMARIN 0.625 MG tablet, , Disp: , Rfl: 3 .  traMADol (ULTRAM) 50 MG tablet, Take 1 tablet (50 mg total) by mouth every 6 (six) hours as needed for moderate pain., Disp: 30 tablet, Rfl: 0 .  oxyCODONE-acetaminophen (PERCOCET/ROXICET) 5-325 MG tablet, Take 1 tablet by mouth every 4 (four) hours as needed for up to 5 days for severe pain., Disp: 20 tablet, Rfl: 0   Review of Systems:   Review of Systems  Constitutional: Negative for chills and fever.  Respiratory: Negative for cough and hemoptysis.   Cardiovascular: Negative for chest pain, palpitations and orthopnea.  Musculoskeletal: Positive for joint pain.  Skin: Positive for itching and rash.    Vitals:   Vitals:   12/16/17 1330  BP: 126/75  Pulse: 73  SpO2: 96%  Weight: 147 lb 8 oz (66.9 kg)  Height: 5' 2.5" (1.588 m)     Body mass index is 26.55 kg/m.  Physical Exam:   Physical Exam  Constitutional: She appears well-developed. She is cooperative.  Non-toxic appearance. She does not have a sickly appearance. She does not appear ill. No distress.  Cardiovascular: Normal rate, regular rhythm, S1 normal, S2 normal, normal heart sounds and normal pulses.  Pulses:      Dorsalis pedis pulses are 2+ on the right side, and 2+ on the left side.  No LE edema   Pulmonary/Chest: Effort normal and breath sounds normal.  Musculoskeletal:  Tenderness with light palpation to L calf. L calf approximately 2.5 cm >> than R calf. Erythema to L calf. Pain with extension of LLE and pain to L calf flexion/extension of L foot.  Neurological: She is alert. GCS eye subscore is 4. GCS verbal subscore is 5. GCS motor subscore  is 6.  Normal sensation to bilateral LE  Skin: Skin is warm, dry and intact.  Psychiatric: She has a normal mood and affect. Her speech is normal and behavior is normal.  Nursing note and vitals reviewed.   Assessment and Plan:    Kara Kennedy was seen today for left calf pain.  Diagnoses and all orders for this visit:  Pain of left calf Dr. Teresa Coombs in to evaluate patient. POCT ultrasound performed. Likely re-accumulation of Baker's Cyst. Patient was taken to procedure room for further evaluation and treatment. Return precautions given. -     [place] MSK POCT ULTRASOUND -     oxyCODONE-acetaminophen (PERCOCET/ROXICET) 5-325 MG tablet; Take 1 tablet by mouth every 4 (four) hours as needed for up to 5 days for severe pain.   . Reviewed expectations re: course of current medical issues. . Discussed self-management of symptoms. . Outlined signs and symptoms indicating need for more acute intervention. . Patient verbalized understanding and all questions were answered. . See orders for this visit as documented in the electronic medical record. . Patient received an After-Visit Summary.  CMA or LPN served as scribe during this visit. History, Physical, and Plan performed by medical provider. Documentation and orders reviewed and attested to.  Inda Coke, PA-C

## 2017-12-17 LAB — SYNOVIAL FLUID, CELL COUNT
Eos, Fluid: 22 %
LINING CELLS, SYNOVIAL: 0 %
Lymphs, Fluid: 28 %
Macrophages Fld: 49 %
Nuc cell # Fld: 1204 cells/uL — ABNORMAL HIGH (ref 0–200)
POLYS FL: 1 %
RBC, Fluid: 2000 /uL

## 2017-12-18 ENCOUNTER — Ambulatory Visit: Payer: 59 | Admitting: Sports Medicine

## 2017-12-18 DIAGNOSIS — C44622 Squamous cell carcinoma of skin of right upper limb, including shoulder: Secondary | ICD-10-CM | POA: Diagnosis not present

## 2017-12-21 LAB — SPECIMEN STATUS REPORT

## 2017-12-21 LAB — BODY FLUID CULTURE

## 2017-12-23 ENCOUNTER — Encounter: Payer: Self-pay | Admitting: Family Medicine

## 2017-12-23 ENCOUNTER — Ambulatory Visit: Payer: 59 | Admitting: Sports Medicine

## 2017-12-23 DIAGNOSIS — Z0289 Encounter for other administrative examinations: Secondary | ICD-10-CM

## 2017-12-28 ENCOUNTER — Encounter: Payer: Self-pay | Admitting: Sports Medicine

## 2018-01-06 ENCOUNTER — Other Ambulatory Visit: Payer: Self-pay | Admitting: Surgical

## 2018-01-06 MED ORDER — PREMARIN 0.625 MG PO TABS: 0.6250 mg | ORAL_TABLET | Freq: Every day | ORAL | 3 refills | Status: DC

## 2018-01-06 MED FILL — PREMARIN 0.625 MG TABLET: 0.625 | 30 days supply | Qty: 30 | Fill #0

## 2018-01-06 NOTE — Telephone Encounter (Signed)
Is it ok to refill patient Premarin? Historical provider.

## 2018-01-15 ENCOUNTER — Ambulatory Visit: Payer: Self-pay

## 2018-01-15 ENCOUNTER — Encounter: Payer: Self-pay | Admitting: Sports Medicine

## 2018-01-15 ENCOUNTER — Ambulatory Visit: Payer: 59 | Admitting: Sports Medicine

## 2018-01-15 VITALS — BP 120/82 | HR 80 | Ht 62.5 in | Wt 148.4 lb

## 2018-01-15 DIAGNOSIS — M25552 Pain in left hip: Secondary | ICD-10-CM | POA: Diagnosis not present

## 2018-01-15 NOTE — Patient Instructions (Addendum)

## 2018-01-15 NOTE — Progress Notes (Signed)
Kara Kennedy. [name], [place] [facility] [place]  Kara Kennedy - 62 y.o. female MRN [id number]  Date of birth: 07-Mar-1956  Visit Date: 01/15/2018  PCP: [name], DO   Referred by: [name], DO  Scribe(s) for today's visit: Josepha Pigg, CMA  SUBJECTIVE:  Kara Kennedy is here for L hip pain   Her L hip pain symptoms INITIALLY: Began 01/10/2018 and MOI is unknown.  Described as mild-moderate dull aching with occasional short sharp pain, radiating to the L groin.  Worsened with external rotation, lifting leg to get in and out of car.  Improved with lying down. She notices improvement after walking around.  Additional associated symptoms include: She has recently had LBP while traveling in early June. She has fusion at L5-S1. She has noticed pain in the L groin. Up until today she was having to use her hands to lift her leg. Hip pain is more anterior.     At this time symptoms are improving compared to onset.  She has been taking tylenol and/or tramadol with some relief.   No recent XR of hip.    REVIEW OF SYSTEMS: Reports night time disturbances. Denies fevers, chills, or night sweats. Denies unexplained weight loss. Denies personal history of cancer. Denies changes in bowel or bladder habits. Denies recent unreported falls. Denies new or worsening dyspnea or wheezing. Denies headaches or dizziness.  Reports numbness down the medial aspect of the L leg.  Denies dizziness or presyncopal episodes Denies lower extremity edema    HISTORY:  Prior history reviewed and updated per electronic medical record.  Social History   Occupational History  . Not on file  Tobacco Use  . Smoking status: Former Research scientist (life sciences)  . Smokeless tobacco: Never Used  Substance and Sexual Activity  . Alcohol use: Yes    Alcohol/week: 7.0 standard drinks    Types: 7 Glasses of wine per week  . Drug use: No  . Sexual activity: Yes   Partners: Male    Birth control/protection: Surgical   Social History   Social History Narrative  . Not on file    DATA OBTAINED & REVIEWED:  No results for input(s): HGBA1C, LABURIC, CREATINE in the last 8760 hours. .    OBJECTIVE:  VS:  HT:5' 2.5" (158.8 cm)   WT:148 lb 6.4 oz (67.3 kg)  BMI:26.69    BP:120/82  HR:80bpm  TEMP: ( )  RESP:97 %   PHYSICAL EXAM: CONSTITUTIONAL: Well-developed, Well-nourished and In no acute distress PSYCHIATRIC: Alert & appropriately interactive. and Not depressed or anxious appearing. RESPIRATORY: No increased work of breathing and Trachea Midline EYES: Pupils are equal., EOM intact without nystagmus. and No scleral icterus.  VASCULAR EXAM: Warm and well perfused NEURO: unremarkable  MSK Exam: Right hip  Well aligned, no significant deformity. No overlying skin changes. No focal bony tenderness   Log Roll: positive, mild pain FADIR: positive, moderate pain FABER: positive, mild pain Stinchfield testing: positive, moderate pain Strength: 4/5 Axial loading produces: No pain and No crepitation  ASSESSMENT   1. Left hip pain     PLAN:  Pertinent additional documentation may be included in corresponding procedure notes, imaging studies, problem based documentation and patient instructions.  Procedures:  . US Guided Injection per procedure note  Medications:  No orders of the defined types were placed in this encounter.  Discussion/Instructions: No problem-specific Assessment & Plan notes found for this encounter.  . Symptoms are consistent  with intra-articular pathology.  Diagnostic and therapeutic injection performed today   . Discussed red flag symptoms that warrant earlier emergent evaluation and patient voices understanding. . Activity modifications and the importance of avoiding exacerbating activities (limiting pain to no more than a 4 / 10 during or following activity) recommended and discussed.  Follow-up:  . Return  if symptoms worsen or fail to improve.  . If any lack of improvement consider: . further diagnostic evaluation with Plain film x-rays and/or MR arthrogram of the hip     CMA/ATC served as scribe during this visit. History, Physical, and Plan performed by medical provider. Documentation and orders reviewed and attested to.      [name], [place] [facility] Physician

## 2018-01-15 NOTE — Procedures (Signed)
PROCEDURE NOTE:  Ultrasound Guided: Injection: Left hip Images were obtained and interpreted by myself, Teresa Coombs, DO  Images have been saved and stored to PACS system. Images obtained on: GE S7 Ultrasound machine    ULTRASOUND FINDINGS:  Small effusion, no obvious labral tear, minimal degenerative change   DESCRIPTION OF PROCEDURE:  The patient's clinical condition is marked by substantial pain and/or significant functional disability. Other conservative therapy has not provided relief, is contraindicated, or not appropriate. There is a reasonable likelihood that injection will significantly improve the patient's pain and/or functional impairment.   After discussing the risks, benefits and expected outcomes of the injection and all questions were reviewed and answered, the patient wished to undergo the above named procedure.  Verbal consent was obtained.  The ultrasound was used to identify the target structure and adjacent neurovascular structures. The skin was then prepped in sterile fashion and the target structure was injected under direct visualization using sterile technique as below:  Single injection performed as below: PREP: Alcohol and Ethel Chloride APPROACH:direct, stopcock technique, 22g 3.5 in. INJECTATE: 5 cc 1% lidocaine, 2 cc 0.5% Marcaine and 2 cc 40mg /mL DepoMedrol ASPIRATE: None DRESSING: Band-Aid  Post procedural instructions including recommending icing and warning signs for infection were reviewed.    This procedure was well tolerated and there were no complications.   IMPRESSION: Succesful Ultrasound Guided: Injection

## 2018-01-20 ENCOUNTER — Ambulatory Visit: Payer: 59 | Admitting: Obstetrics and Gynecology

## 2018-01-24 MED FILL — LINZESS 290 MCG CAPSULE: 290 | 90 days supply | Qty: 90 | Fill #2

## 2018-01-29 MED FILL — GABAPENTIN 300 MG CAPSULE: 300 | 90 days supply | Qty: 270 | Fill #2

## 2018-02-03 MED FILL — PREMARIN 0.625 MG TABLET: 0.625 | 30 days supply | Qty: 30 | Fill #1

## 2018-02-12 ENCOUNTER — Other Ambulatory Visit: Payer: Self-pay | Admitting: Surgical

## 2018-02-12 ENCOUNTER — Other Ambulatory Visit (INDEPENDENT_AMBULATORY_CARE_PROVIDER_SITE_OTHER): Payer: 59 | Admitting: Family Medicine

## 2018-02-12 DIAGNOSIS — R5383 Other fatigue: Secondary | ICD-10-CM | POA: Diagnosis not present

## 2018-02-12 DIAGNOSIS — E039 Hypothyroidism, unspecified: Secondary | ICD-10-CM | POA: Diagnosis not present

## 2018-02-12 DIAGNOSIS — Z13220 Encounter for screening for lipoid disorders: Secondary | ICD-10-CM | POA: Diagnosis not present

## 2018-02-12 LAB — LIPID PANEL
Cholesterol: 174 mg/dL (ref 0–200)
HDL: 83.8 mg/dL (ref >39.00)
LDL Cholesterol: 69 mg/dL (ref 0–99)
NonHDL: 90.48
Total CHOL/HDL Ratio: 2
Triglycerides: 108 mg/dL (ref 0.0–149.0)
VLDL: 21.6 mg/dL (ref 0.0–40.0)

## 2018-02-12 LAB — T4, FREE: Free T4: 0.88 ng/dL (ref 0.60–1.60)

## 2018-02-12 LAB — VITAMIN D 25 HYDROXY (VIT D DEFICIENCY, FRACTURES): VITD: 29.55 ng/mL — ABNORMAL LOW (ref 30.00–100.00)

## 2018-02-12 LAB — TSH: TSH: 1.43 u[IU]/mL (ref 0.35–4.50)

## 2018-02-12 NOTE — Progress Notes (Deleted)
Kara Kennedy is a 62 y.o. female is here for follow up.  History of Present Illness:   HPI: Health Maintenance Due  Topic Date Due  . Hepatitis C Screening  06/09/1956  . HIV Screening  04/03/1970  . COLONOSCOPY  04/03/2005   Depression screen PHQ 2/9 03/12/2017  Decreased Interest 0  Down, Depressed, Hopeless 0  PHQ - 2 Score 0   PMHx, SurgHx, SocialHx, FamHx, Medications, and Allergies were reviewed in the Visit Navigator and updated as appropriate.   Patient Active Problem List   Diagnosis Date Noted  . Cervical spondylosis 10/26/2017  . Rotator cuff syndrome of right shoulder 08/25/2017  . Right sided facial pain 03/12/2017  . Vitamin D deficiency 03/09/2017  . Stress fracture of right fibula 02/02/2017  . Right ankle pain 01/02/2017  . IBS (irritable bowel syndrome) 12/29/2016  . Family history of colon cancer 12/29/2016  . Anxiety 03/06/2014  . Hypothyroidism 03/06/2014  . Post menopausal syndrome 03/06/2014  . Chronic neck pain 09/06/2012  . Allergic rhinitis 07/30/2012  . History of pyelonephritis 07/30/2012  . Insomnia 08/19/2011   Social History   Tobacco Use  . Smoking status: Former Research scientist (life sciences)  . Smokeless tobacco: Never Used  Substance Use Topics  . Alcohol use: Yes    Alcohol/week: 4.2 oz    Types: 7 Glasses of wine per week  . Drug use: No   Current Medications and Allergies:   Current Outpatient Medications:  .  budesonide-formoterol (SYMBICORT) 160-4.5 MCG/ACT inhaler, Inhale 2 puffs into the lungs 2 (two) times daily., Disp: 1 Inhaler, Rfl: 3 .  calcium carbonate (TUMS - DOSED IN MG ELEMENTAL CALCIUM) 500 MG chewable tablet, Chew 1 tablet by mouth daily., Disp: , Rfl:  .  docusate sodium (COLACE) 100 MG capsule, Take 100 mg by mouth 2 (two) times daily., Disp: , Rfl:  .  FLUoxetine (PROZAC) 20 MG capsule, TAKE 1 CAPSULE BY MOUTH ONCE DAILY, Disp: 90 capsule, Rfl: 1 .  gabapentin (NEURONTIN) 300 MG capsule, Take by mouth., Disp: , Rfl:  .   levothyroxine (SYNTHROID) 88 MCG tablet, Take 1 tablet (88 mcg total) by mouth every morning., Disp: 90 tablet, Rfl: 1 .  linaclotide (LINZESS) 290 MCG CAPS capsule, Take 1 capsule (290 mcg total) by mouth daily before breakfast., Disp: 90 capsule, Rfl: 3 .  meloxicam (MOBIC) 15 MG tablet, Take 1 tablet (15 mg total) by mouth daily. (Patient not taking: Reported on 01/15/2018), Disp: 30 tablet, Rfl: 0 .  montelukast (SINGULAIR) 10 MG tablet, Take 1 tablet (10 mg total) by mouth daily., Disp: 90 tablet, Rfl: 3 .  PREMARIN 0.625 MG tablet, Take 1 tablet (0.625 mg total) by mouth daily., Disp: 30 tablet, Rfl: 3 .  traMADol (ULTRAM) 50 MG tablet, Take 1 tablet (50 mg total) by mouth every 6 (six) hours as needed for moderate pain., Disp: 30 tablet, Rfl: 0  Allergies  Allergen Reactions  . Sulfamethoxazole-Trimethoprim    Review of Systems   Pertinent items are noted in the HPI. Otherwise, ROS is negative.  Vitals:  There were no vitals filed for this visit.   There is no height or weight on file to calculate BMI.  Physical Exam:   Physical Exam Results for orders placed or performed in visit on 12/16/17  Body fluid culture  Result Value Ref Range   Body Fluid Culture, Sterile Final report    Organism ID, Bacteria Comment   Synovial fluid, cell count  Result Value Ref Range  Color, Fluid Yellow Yellow   Clarity, Fluid Slightly hazy Clear   Nuc cell # Fld 1,204 (H) 0 - 200 cells/uL   RBC, Fluid 2,000 Not Estab. /uL   Polys, Fluid 1 Not Estab. %   Lymphs, Fluid 28 Not Estab. %   Macrophages Fld 49 Not Estab. %   Eos, Fluid 22 Not Estab. %   Lining Cells, Synovial 0 Not Estab. %   Crystals, Joint Fluid Comment None seen  Specimen status report  Result Value Ref Range   specimen status report Comment     Assessment and Plan:   There are no diagnoses linked to this encounter.  . Reviewed expectations re: course of current medical issues. . Discussed self-management of  symptoms. . Outlined signs and symptoms indicating need for more acute intervention. . Patient verbalized understanding and all questions were answered. Marland Kitchen Health Maintenance issues including appropriate healthy diet, exercise, and smoking avoidance were discussed with patient. . See orders for this visit as documented in the electronic medical record. . Patient received an After Visit Summary.  [name], DO [place] [place] [facility] 02/12/2018  Future Appointments  Date Time Provider [place]  02/12/2018  2:40 PM [name], DO [facility]    *** CMA served as scribe during this visit. History, Physical, and Plan performed by medical provider. The above documentation has been reviewed and is accurate and complete. [name], D.O.

## 2018-02-13 NOTE — Progress Notes (Signed)
LAB VISIT ONLY

## 2018-03-03 ENCOUNTER — Other Ambulatory Visit: Payer: Self-pay | Admitting: Surgical

## 2018-03-03 ENCOUNTER — Other Ambulatory Visit: Payer: Self-pay | Admitting: Family Medicine

## 2018-03-03 MED ORDER — DIAZEPAM 5 MG PO TABS: 5.0000 mg | ORAL_TABLET | Freq: Two times a day (BID) | ORAL | 1 refills | Status: DC | PRN

## 2018-03-03 MED FILL — PREMARIN 0.625 MG TABLET: 0.625 | 30 days supply | Qty: 30 | Fill #2

## 2018-03-03 MED FILL — diazePAM 5 MG TABS: 5 | 15 days supply | Qty: 30 | Fill #0

## 2018-03-03 MED FILL — SYNTHROID 88 MCG TABLET: 88 | 90 days supply | Qty: 90 | Fill #1

## 2018-03-03 NOTE — Progress Notes (Signed)
Med sent to pharmacy. Kara Kennedy

## 2018-03-04 ENCOUNTER — Encounter: Payer: Self-pay | Admitting: Family Medicine

## 2018-03-04 ENCOUNTER — Ambulatory Visit: Payer: 59 | Admitting: Family Medicine

## 2018-03-04 VITALS — BP 128/76 | HR 73 | Temp 97.7°F | Ht 62.5 in | Wt 148.2 lb

## 2018-03-04 DIAGNOSIS — M62838 Other muscle spasm: Secondary | ICD-10-CM

## 2018-03-04 MED ORDER — TIZANIDINE HCL 4 MG PO TABS: 4.0000 mg | ORAL_TABLET | Freq: Four times a day (QID) | ORAL | 0 refills | Status: DC | PRN

## 2018-03-04 MED ORDER — DICLOFENAC SODIUM 75 MG PO TBEC: 75.0000 mg | DELAYED_RELEASE_TABLET | Freq: Two times a day (BID) | ORAL | 0 refills | Status: DC

## 2018-03-04 MED FILL — DICLOFENAC SOD EC 75 MG TAB: 75 | 15 days supply | Qty: 30 | Fill #0

## 2018-03-04 MED FILL — tiZANidine HCL 4 MG TABS: 4 | 7 days supply | Qty: 30 | Fill #0

## 2018-03-04 NOTE — Progress Notes (Signed)
   Subjective:  Kara Kennedy is a 62 y.o. female who presents today for same-day appointment with a chief complaint of neck pain.   HPI:  Neck Pain, Acute problem Started 3 days ago. Worsened over that time. No clear precipitating event. Pain mostly located to her left posterior neck.  She is tried putting heating pad on it with modest improvement.  Her neck is also very stiff.  She has pain with any sort of neck movement.  Tried taking Tylenol which has not helped.  No weakness or numbness.  No fevers or chills.  She has never had anything like this in the past.  ROS: Per HPI  PMH: She reports that she has quit smoking. She has never used smokeless tobacco. She reports that she drinks about 7.0 standard drinks of alcohol per week. She reports that she does not use drugs.  Objective:  Physical Exam: BP 128/76 (BP Location: Left Arm, Patient Position: Sitting, Cuff Size: Large)   Pulse 73   Temp 97.7 F (36.5 C) (Oral)   Ht 5' 2.5" (1.588 m)   Wt 148 lb 3.2 oz (67.2 kg)   SpO2 97%   BMI 26.67 kg/m   Gen: NAD, resting comfortably MSK: -Neck: No deformities.  No spinous process tenderness. Tender to palpation along left paraspinal muscles.  Normal neck flexion.  Limited extension to approximately 20 degrees.  Limited leftward rotation to approximately 20 degrees.  Limited rightward rotation to approximately 40 degrees. -Upper extremities: No deformities.  Strength 5 out of 5 throughout.  Neurovascular intact distally.  Biceps reflexes 2+ and symmetric bilaterally. -Lower extremities:.  Intact.  Reflexes 2+ and symmetric bilaterally.   Assessment/Plan:  Neck spasm No red flag signs or symptoms.  We will increase her Valium to 10 mg twice daily.  We will also add on Zanaflex 4 mg 4 times daily as needed.  Start diclofenac 75 mg twice daily for the next 1 to 2 weeks.  Offered Depo-Medrol injection, however patient declined.  Discussed home exercise program focusing on stretching neck  musculature.  She will continue heat pack as well.  Discussed reasons to return to care.  Follow-up as needed.  Algis Greenhouse. Jerline Pain, MD 03/04/2018 4:10 PM

## 2018-03-04 NOTE — Patient Instructions (Signed)
It was very nice to see you today!  It is ok for you to increase your valium dose to 10mg  twice daily.  Please take the zanaflex as needed.  Please take the diclofenac twice daily for the next 1-2 weeks.  Work on the exercises.  Let me or Dr Juleen China know if your symptoms do not improve over the next few days.   Take care, Dr Jerline Pain

## 2018-03-17 MED FILL — diazePAM 5 MG TABS: 5 | 15 days supply | Qty: 30 | Fill #1

## 2018-03-23 ENCOUNTER — Encounter: Payer: Self-pay | Admitting: Sports Medicine

## 2018-03-23 ENCOUNTER — Ambulatory Visit (INDEPENDENT_AMBULATORY_CARE_PROVIDER_SITE_OTHER): Payer: 59 | Admitting: Sports Medicine

## 2018-03-23 ENCOUNTER — Other Ambulatory Visit: Payer: Self-pay | Admitting: Sports Medicine

## 2018-03-23 ENCOUNTER — Ambulatory Visit (INDEPENDENT_AMBULATORY_CARE_PROVIDER_SITE_OTHER): Payer: 59

## 2018-03-23 VITALS — BP 126/74 | HR 74 | Temp 98.6°F | Ht 63.0 in | Wt 146.6 lb

## 2018-03-23 DIAGNOSIS — M25562 Pain in left knee: Secondary | ICD-10-CM

## 2018-03-23 DIAGNOSIS — M4722 Other spondylosis with radiculopathy, cervical region: Secondary | ICD-10-CM

## 2018-03-23 DIAGNOSIS — M75101 Unspecified rotator cuff tear or rupture of right shoulder, not specified as traumatic: Secondary | ICD-10-CM

## 2018-03-23 DIAGNOSIS — M79662 Pain in left lower leg: Secondary | ICD-10-CM | POA: Diagnosis not present

## 2018-03-23 MED ORDER — NITROGLYCERIN 0.2 MG/HR TD PT24: MEDICATED_PATCH | TRANSDERMAL | 1 refills | Status: DC

## 2018-03-23 MED FILL — NITROGLYCERIN 0.2 MG/HR PTC: 0.2 | 60 days supply | Qty: 30 | Fill #0

## 2018-03-23 NOTE — Progress Notes (Signed)
[name]. Laronda Lisby, [place] [facility] [place]  AMBRIANA SELWAY - 62 y.o. female MRN [id number]  Date of birth: 14-Aug-1955  Visit Date: 03/23/2018  PCP: [name], DO   Referred by: [name], DO  Scribe(s) for today's visit: Josepha Pigg, CMA  SUBJECTIVE:  AJAI TERHAAR is here for Follow-up (L knee, R shoulder)   08/24/17: Her RT shoulder pain symptoms INITIALLY: Began about 1 month ago and MOI is unknown. Described as moderate aching, radiating to the RT arm but does not go past elbow. No loss of grip strength.  Worsened with reaching back, down and back, across the body. Some pain when reaching up.  Improved with arm straight by side.  Additional associated symptoms include: Pain is anterior. No swelling. Decreased ROM.  At this time symptoms are worsening compared to onset  She has been taking Tylenol and IBU with some relief. IBU causes GI upset.   10/26/2017: Compared to the last office visit, her previously described symptoms show no change. Current symptoms are moderate & radiates into the R arm and wrist. Pain is severe at night. She only gets relief when she is lying on the R shoulder/side.  She has been taking Tylenol with some relief during the day.  She had steroid injection 08/24/2017 for shoulder pain.   12/07/2017: Compared to the last office visit, her previously described symptoms are improving, minimal pain during the day.  Current symptoms are mild & are nonradiating. Pain is more bothersome at night.  She has been taking Tylenol with some relief.   She c/o L knee pain x 3 weeks. Pain is mostly posterior.  Pain is described as moderate-severe aching at its worst. Pain radiates into the calf.  Pain is worse when fully extending the knee or using stairs. Pain is also worse when first getting up.  She denies swelling around the knee. Improves at rest.  She has tried taking tylenol with minimal  relief. She has also been trying to keep the leg elevated.  No recent imaging.  03/23/2018: L knee Compared to the last office visit, her previously described symptoms are worsening. Current symptoms are moderate & are nonradiating She has been performing therapeutic exercises.  She continues to have some catching without locking or giving way.   R shoulder Compared to the last office visit, her previously described symptoms are worsening. Current symptoms are moderate & are nonradiating    REVIEW OF SYSTEMS: Denies night time disturbances. Denies fevers, chills, or night sweats. Denies unexplained weight loss. Denies personal history of cancer. Denies changes in bowel or bladder habits. Denies recent unreported falls. Denies new or worsening dyspnea or wheezing. Denies headaches or dizziness.  Denies numbness, tingling or weakness  In the extremities.  Denies dizziness or presyncopal episodes Denies lower extremity edema     HISTORY & PERTINENT PRIOR DATA:  Significant/pertinent history, findings, studies include:  reports that she has quit smoking. She has never used smokeless tobacco. No results for input(s): HGBA1C, LABURIC, CREATINE in the last 8760 hours. No specialty comments available. No problems updated.  Otherwise prior history reviewed and updated per electronic medical record.   OBJECTIVE:  VS:  HT:5\' 3"  (160 cm)   WT:146 lb 9.6 oz (66.5 kg)  BMI:25.98    BP:126/74  HR:74bpm  TEMP:98.6 F (37 C)(Oral)  RESP:98 %   PHYSICAL EXAM: CONSTITUTIONAL: Well-developed, Well-nourished and In no acute distress Alert & appropriately interactive. and Not  depressed or anxious appearing. RESPIRATORY: No increased work of breathing and Trachea Midline EYES: Pupils are equal., EOM intact without nystagmus. and No scleral icterus.  Upper and Lower extremities: Warm and well perfused Edema: No significant swelling or edema NEURO: unremarkable Normal associated  myotomal distribution strength to manual muscle testing Normal sensation to light touch  MSK Exam: Right shoulder has limited overhead range of motion.  Positive Hawkins and empty can test without significant mechanical symptoms.  Strength is maintained with empty can testing although painful.  Left knee has persistent pain with palpation of the medial and lateral joint lines.  Slightly positive McMurray's worse on the medial aspect.  PROCEDURES & DATA REVIEWED:  . Outside/prior images reviewed today with the patient that showed Of the knee reveal only mild/minimal degenerative change.  ASSESSMENT   1. Acute pain of left knee   2. Rotator cuff syndrome of right shoulder   3. Osteoarthritis of spine with radiculopathy, cervical region   4. Pain of left calf     PLAN:   Rest the injured area as much as practical Apply ice packs Continue your home exercise program    . If any lack of improvement consider further diagnostic evaluation with MRI of the shoulder . Recommend conservative treatment at this time per AVS  for her knee we will start her on Pennsaid and set her up for an MRI at this point given the lack of improvement with prior interventions.  Is been going on for quite some time and she has failed conservative management including injections. . For her shoulder injection is proving only minimally beneficial.  We will go ahead and start her on nitroglycerin protocol for presumed Tendinopathic changes and if any lack of improvement further diagnostic evaluation will be pursued with MR arthrogram.  No problem-specific Assessment & Plan notes found for this encounter.  Follow-up: Return for MRI results review.      Please see additional documentation for Objective, Assessment and Plan sections. Pertinent additional documentation may be included in corresponding procedure notes, imaging studies, problem based documentation and patient instructions. Please see these sections of the  encounter for additional information regarding this visit.  CMA/ATC served as Education administrator during this visit. History, Physical, and Plan performed by medical provider. Documentation and orders reviewed and attested to.      [name], [facility] Physician

## 2018-03-23 NOTE — Patient Instructions (Addendum)
Nitroglycerin Protocol   Apply 1/4 nitroglycerin patch to affected area daily.  Change position of patch within the affected area every 24 hours.  You may experience a headache during the first 1-2 weeks of using the patch, these should subside.  If you experience headaches after beginning nitroglycerin patch treatment, you may take your preferred over the counter pain reliever.  Another side effect of the nitroglycerin patch is skin irritation or rash related to patch adhesive.  Please notify our office if you develop more severe headaches or rash, and stop the patch.  Tendon healing with nitroglycerin patch may require 12 to 24 weeks depending on the extent of injury.  Men should not use if taking Viagra, Cialis, or Levitra.   Do not use if you have migraines or rosacea.   [facility] instructions for Duexis, Pennsaid and Vimovo:  Your prescription will be filled through a mail order pharmacy.  It is typically [facility] but may vary depending on where you live.  You will receive a phone call from them which will typically come from a [phone] phone number.  You must speak directly to them to have this medication filled.  When the pharmacy calls, they will need your mailing address (for overnight shipment of the medication) andy they will need payment information if you have a copay (typically no more than $10). If you have not heard from them 2-3 days after your appointment with Dr. [name], contact us at the office [phone]) or through [facility] so we can reach back out to the pharmacy.   Pennsaid instructions: You have been given a sample/prescription for Pennsaid, a topical medication.     You are to apply this gel to your injured body part twice daily (morning and evening).   A little goes a long way so you can use about a pea-sized amount for each area.   Spread this small amount over the area into a thin film and let it dry.   Be sure that you do not rub the gel  into your skin for more than 10 or 15 seconds otherwise it can irritate you skin.    Once you apply the gel, please do not put any other lotion or clothing in contact with that area for 30 minutes to allow the gel to absorb into your skin.   Some people are sensitive to the medication and can develop a sunburn-like rash.  If you have only mild symptoms it is okay to continue to use the medication but if you have any breakdown of your skin you should discontinue its use and please let us know.   If you have been written a prescription for Pennsaid, you will receive a pump bottle of this topical gel through a mail order pharmacy.  The instructions on the bottle will say to apply two pumps twice a day which may be too much gel for your particular area so use the pea-sized amount as your guide.

## 2018-03-24 MED ORDER — DICLOFENAC SODIUM 2 % TD SOLN: 1.0000 "application " | Freq: Two times a day (BID) | TRANSDERMAL | 2 refills | Status: DC

## 2018-03-24 MED ORDER — DICLOFENAC SODIUM 2 % TD SOLN: 1.0000 "application " | Freq: Two times a day (BID) | TRANSDERMAL | 0 refills | Status: AC

## 2018-03-25 ENCOUNTER — Telehealth: Payer: Self-pay

## 2018-03-25 MED ORDER — TRAMADOL HCL 50 MG PO TABS: 50.0000 mg | ORAL_TABLET | Freq: Four times a day (QID) | ORAL | 0 refills | Status: DC | PRN

## 2018-03-25 MED FILL — traMADol HCL 50 MG TABS: 50 | 5 days supply | Qty: 20 | Fill #0

## 2018-03-25 NOTE — Addendum Note (Signed)
Addended by: Teresa Coombs D on: 03/25/2018 05:01 PM   Modules accepted: Orders

## 2018-03-25 NOTE — Telephone Encounter (Signed)
Patient was seen in office for left knee pain. She was given sample of Pennsid. Patient states no relief from medication. She has not had any change in symptoms with pain at 7/10. She is getting MRI app but wanted to know if anything could be called in for pain.   [facility]  Allergies: Depo Medrol and Sulfa

## 2018-03-25 NOTE — Telephone Encounter (Signed)
Tramadol Rx sent in

## 2018-03-28 ENCOUNTER — Encounter: Payer: Self-pay | Admitting: Sports Medicine

## 2018-03-30 ENCOUNTER — Ambulatory Visit
Admission: RE | Admit: 2018-03-30 | Discharge: 2018-03-30 | Disposition: A | Discharge status: Home / Self Care | Payer: 59 | Source: Ambulatory Visit | Attending: Sports Medicine | Admitting: Sports Medicine

## 2018-03-30 DIAGNOSIS — M25562 Pain in left knee: Secondary | ICD-10-CM

## 2018-03-30 DIAGNOSIS — M23322 Other meniscus derangements, posterior horn of medial meniscus, left knee: Secondary | ICD-10-CM | POA: Diagnosis not present

## 2018-03-30 MED FILL — PREMARIN 0.625 MG TABLET: 0.625 | 30 days supply | Qty: 30 | Fill #3

## 2018-04-05 ENCOUNTER — Encounter: Payer: Self-pay | Admitting: Sports Medicine

## 2018-04-05 ENCOUNTER — Ambulatory Visit: Payer: Self-pay

## 2018-04-05 ENCOUNTER — Ambulatory Visit (INDEPENDENT_AMBULATORY_CARE_PROVIDER_SITE_OTHER): Payer: 59 | Admitting: Sports Medicine

## 2018-04-05 VITALS — BP 110/80 | HR 81 | Ht 63.0 in | Wt 148.8 lb

## 2018-04-05 DIAGNOSIS — M75101 Unspecified rotator cuff tear or rupture of right shoulder, not specified as traumatic: Secondary | ICD-10-CM

## 2018-04-05 DIAGNOSIS — M25562 Pain in left knee: Secondary | ICD-10-CM

## 2018-04-05 DIAGNOSIS — M25511 Pain in right shoulder: Secondary | ICD-10-CM

## 2018-04-05 NOTE — Procedures (Addendum)
PROCEDURE NOTE:  Ultrasound Guided: Injection: Right shoulder, intra-articular Images were obtained and interpreted by myself, Teresa Coombs, DO  Images have been saved and stored to PACS system. Images obtained on: GE S7 Ultrasound machine    ULTRASOUND FINDINGS:  Small effusion  DESCRIPTION OF PROCEDURE:  The patient's clinical condition is marked by substantial pain and/or significant functional disability. Other conservative therapy has not provided relief, is contraindicated, or not appropriate. There is a reasonable likelihood that injection will significantly improve the patient's pain and/or functional impairment.   After discussing the risks, benefits and expected outcomes of the injection and all questions were reviewed and answered, the patient wished to undergo the above named procedure.  Verbal consent was obtained.  The ultrasound was used to identify the target structure and adjacent neurovascular structures. The skin was then prepped in sterile fashion and the target structure was injected under direct visualization using sterile technique as below:  Single injection performed as below: PREP: Alcohol and Ethel Chloride APPROACH:superiolateral posterior, single injection, 21g 2 in. INJECTATE: 2 cc 0.5% Marcaine and 2 cc 40 mg/mL Kenalog ASPIRATE: None DRESSING: Band-Aid  Post procedural instructions including recommending icing and warning signs for infection were reviewed.    This procedure was well tolerated and there were no complications.   IMPRESSION: Succesful Ultrasound Guided: Injection

## 2018-04-05 NOTE — Progress Notes (Signed)
Kara Kennedy. Rig[place], Kara Kennedy at [facility]  Kara Kennedy - 62 y.o. female MRN [id number]  Date of birth: [birth year]  Visit Date: 04/05/2018  PCP: [name], DO   Referred by: [name], DO  Scribe(s) for today's visit: Kara Kennedy, CMA  SUBJECTIVE:  Kara Kennedy is here for Follow-up (L knee, R shoulder)  08/24/17: Her RT shoulder pain symptoms INITIALLY: Began about 1 month ago and MOI is unknown. Described as moderate aching, radiating to the RT arm but does not go past elbow. No loss of grip strength.  Worsened with reaching back, down and back, across the body. Some pain when reaching up.  Improved with arm straight by side.  Additional associated symptoms include: Pain is anterior. No swelling. Decreased ROM.  At this time symptoms are worsening compared to onset  She has been taking Tylenol and IBU with some relief. IBU causes GI upset.   10/26/2017: Compared to the last office visit, her previously described symptoms show no change. Current symptoms are moderate & radiates into the R arm and wrist. Pain is severe at night. She only gets relief when she is lying on the R shoulder/side.  She has been taking Tylenol with some relief during the day.  She had steroid injection 08/24/2017 for shoulder pain.   12/07/2017: Compared to the last office visit, her previously described symptoms are improving, minimal pain during the day.  Current symptoms are mild & are nonradiating. Pain is more bothersome at night.  She has been taking Tylenol with some relief.   She c/o L knee pain x 3 weeks. Pain is mostly posterior.  Pain is described as moderate-severe aching at its worst. Pain radiates into the calf.  Pain is worse when fully extending the knee or using stairs. Pain is also worse when first getting up.  She denies swelling around the knee. Improves at rest.  She has tried taking tylenol with minimal relief.  She has also been trying to keep the leg elevated.  No recent imaging.  03/23/2018: L knee Compared to the last office visit, her previously described symptoms are worsening. Current symptoms are moderate & are nonradiating She has been performing therapeutic exercises.  She continues to have some catching without locking or giving way.   R shoulder Compared to the last office visit, her previously described symptoms are worsening. Current symptoms are moderate & are nonradiating   04/05/2018 R shoulder Compared to the last office visit, her previously described symptoms show no change. Current symptoms are moderate-severe & are radiating to the R arm.  Sx are worse when pushing motion.  She has been following Nitro Protocol (1/4 patch) with no relief. She had HA the first day using patches but that has resolved. She denies rash. She has tried taking Tylenol and Tramadol with no relief.   L knee No change with knee pain. Review MRI of L knee.    REVIEW OF SYSTEMS: Reports night time disturbances. Denies fevers, chills, or night sweats. Denies unexplained weight loss. Denies personal history of cancer. Denies changes in bowel or bladder habits. Denies recent unreported falls. Denies new or worsening dyspnea or wheezing. Denies headaches or dizziness.  Denies numbness, tingling or weakness  In the extremities.  Denies dizziness or presyncopal episodes Denies lower extremity edema     HISTORY & PERTINENT PRIOR DATA:  Significant/pertinent history, findings, studies include:  reports that she has quit smoking. She has  never used smokeless tobacco. No results for input(s): HGBA1C, LABURIC, CREATINE in the last 8760 hours. No specialty comments available. Problem  Acute Pain of Left Knee  Rotator Cuff Syndrome of Right Shoulder    Otherwise prior history reviewed and updated per electronic medical record.   OBJECTIVE:  VS:  HT:5\' 3"  (160 cm)   WT:148 lb 12.8 oz (67.5 kg)   BMI:26.37    BP:110/80  HR:81bpm  TEMP: ( )  RESP:98 %   PHYSICAL EXAM: CONSTITUTIONAL: Well-developed, Well-nourished and In no acute distress Alert & appropriately interactive. and Not depressed or anxious appearing. RESPIRATORY: No increased work of breathing and Trachea Midline EYES: Pupils are equal., EOM intact without nystagmus. and No scleral icterus.  Upper and Lower extremities: Warm and well perfused Edema: No significant swelling or edema NEURO: unremarkable Normal associated myotomal distribution strength to manual muscle testing Normal sensation to light touch  MSK Exam: Right shoulder has limited overhead range of motion.  Positive Hawkins and empty can test without significant mechanical symptoms.  Strength is maintained with empty can testing although painful.  Left knee has persistent pain with palpation of the medial and lateral joint lines.  Slightly positive McMurray's worse on the medial aspect.  PROCEDURES & DATA REVIEWED:  . Outside/prior images reviewed today with the patient that showed Of the knee reveal only mild/minimal degenerative change.  Marland Kitchen MRI of the left knee, 03/30/2018:  IMPRESSION: 1. Small tear along the superior surface of the posterior periphery of the posterior body junction of the medial meniscus. 2. Small oblique tear of the posterior periphery of the posterior horn of the lateral meniscus extending to the inferior articular surface. 3. Cartilage fissuring along the lateral aspect of the medial femoral condyle along the weight-bearing surface.   ASSESSMENT   1. Acute pain of right shoulder   2. Acute pain of left knee   3. Rotator cuff syndrome of right shoulder     PLAN:   Rotator cuff syndrome of right shoulder Ultrasound-guided injection performed today.  If any lack of improvement will consider MRI of the shoulder.  Acute pain of left knee MRI reveals some mild degenerative changes but also meniscal tears.  Would likely  benefit from it both diagnostic and therapeutic arthroscopy as she may ultimately require either partial or total knee arthroplasty at some point in the future.  Referral placed to Dr. Ninfa Linden.  Follow-up: Return if symptoms worsen or fail to improve.      Please see additional documentation for Objective, Assessment and Plan sections. Pertinent additional documentation may be included in corresponding procedure notes, imaging studies, problem based documentation and patient instructions. Please see these sections of the encounter for additional information regarding this visit.  CMA/ATC served as Education administrator during this visit. History, Physical, and Plan performed by medical provider. Documentation and orders reviewed and attested to.      [name], [place] [facility] Physician

## 2018-04-10 DIAGNOSIS — M25562 Pain in left knee: Secondary | ICD-10-CM | POA: Insufficient documentation

## 2018-04-10 NOTE — Assessment & Plan Note (Signed)
Ultrasound-guided injection performed today.  If any lack of improvement will consider MRI of the shoulder.

## 2018-04-10 NOTE — Assessment & Plan Note (Signed)
MRI reveals some mild degenerative changes but also meniscal tears.  Would likely benefit from it both diagnostic and therapeutic arthroscopy as she may ultimately require either partial or total knee arthroplasty at some point in the future.  Referral placed to Dr. Ninfa Linden.

## 2018-04-11 ENCOUNTER — Encounter: Payer: Self-pay | Admitting: Sports Medicine

## 2018-04-13 ENCOUNTER — Encounter: Payer: Self-pay | Admitting: Sports Medicine

## 2018-04-13 NOTE — Telephone Encounter (Signed)
This encounter was created in error - please disregard.

## 2018-04-14 ENCOUNTER — Encounter (INDEPENDENT_AMBULATORY_CARE_PROVIDER_SITE_OTHER): Payer: Self-pay | Admitting: Orthopaedic Surgery

## 2018-04-14 ENCOUNTER — Ambulatory Visit (INDEPENDENT_AMBULATORY_CARE_PROVIDER_SITE_OTHER): Payer: 59 | Admitting: Orthopaedic Surgery

## 2018-04-14 DIAGNOSIS — B079 Viral wart, unspecified: Secondary | ICD-10-CM | POA: Diagnosis not present

## 2018-04-14 DIAGNOSIS — S83207A Unspecified tear of unspecified meniscus, current injury, left knee, initial encounter: Secondary | ICD-10-CM

## 2018-04-14 DIAGNOSIS — D485 Neoplasm of uncertain behavior of skin: Secondary | ICD-10-CM | POA: Diagnosis not present

## 2018-04-14 DIAGNOSIS — D225 Melanocytic nevi of trunk: Secondary | ICD-10-CM | POA: Diagnosis not present

## 2018-04-14 DIAGNOSIS — D220 Melanocytic nevi of lip: Secondary | ICD-10-CM | POA: Diagnosis not present

## 2018-04-14 NOTE — Progress Notes (Signed)
Office Visit Note   Patient: Kara Kennedy           Date of Birth: 1956/07/01           MRN: [id number] Visit Date: 04/14/2018              Requested by: [name], DO [address], [place] [id number] PCP: [name], DO   Assessment & Plan: Visit Diagnoses:  1. Acute meniscal tear of knee, left, initial encounter     Plan: Based on the location of her meniscal tear of the left knee and on the signs and symptoms that she is been experiencing this appears to be more of an acute tear and not arthritic in nature.  She is tried and failed other forms conservative treatment with continued locking catching pain as well as MRI findings of a mid body medial meniscal tear we are recommending an arthroscopic intervention.  I showed her knee model and explained in detail what the surgery involves.  We had a discussion of her intraoperative and postoperative course and the risk and benefits involved.  She would like to have this scheduled soon.  We will get with our surgery scheduler and have her give her a call.  This can be done in the common system since she is a current employee.  Follow-Up Instructions: Return for 1 week post-op.   Orders:  No orders of the defined types were placed in this encounter.  No orders of the defined types were placed in this encounter.     Procedures: No procedures performed   Clinical Data: No additional findings.   Subjective: Chief Complaint  Patient presents with  . Left Knee - Pain  Patient comes in today with acute  left knee pain from a twisting type of injury that occurred earlier in the year.  She is been seen by Dr. Teresa Coombs of sports medicine primary care with [facility].  An MRI has been obtained as well after failed conservative treatment including activity modification and injections.  She points the medial knee as a source of her pain and says is pivoting activities because locking catching and significant pain.  When she is walking straight and then line, she has no issues.  Its pivoting activities  are causing a lot of pain to the medial side of her left knee.  The knee does swell on occasion.  She has tried steroid injections and is gotten where that did not help.  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  Objective: Vital Signs: There were no vitals taken for this visit.  Physical Exam She is alert and oriented x3 and in no acute distress Ortho Exam Examination of her left knee shows no effusion.  She has a positive Murray sign to the medial compartment of her left knee severe pain when I twist the tibia on the femur.  She has significant medial joint line tenderness. Specialty Comments:  No specialty comments available.  Imaging: No results found. Plain films and the MRI of her left knee are independently reviewed and shared with her.  She does have an mid body medial meniscal tear that does not appear degenerative in nature.  There is minimal cartilage changes in the knee.  There is a Baker's cyst as well.  PMFS History: Patient Active Problem List   Diagnosis Date Noted  . Acute pain of left knee 04/10/2018  . Cervical spondylosis 10/26/2017  . Rotator cuff syndrome of right shoulder 08/25/2017  . Right sided facial pain 03/12/2017  . Vitamin D deficiency 03/09/2017  . Stress fracture of right fibula 02/02/2017  . Right ankle pain 01/02/2017  . IBS  (irritable bowel syndrome) 12/29/2016  . Family history of colon cancer 12/29/2016  . Anxiety 03/06/2014  . Hypothyroidism 03/06/2014  . Post menopausal syndrome 03/06/2014  . Chronic neck pain 09/06/2012  . Allergic rhinitis 07/30/2012  . History of pyelonephritis 07/30/2012  . Insomnia 08/19/2011   Past Medical History:  Diagnosis Date  . Allergic rhinitis 07/30/2012  . Anxiety 03/06/2014  . Chronic neck pain 09/06/2012  . Endometriosis   . Family history of colon cancer 12/29/2016  . History of pyelonephritis 07/30/2012  . Hypothyroidism 03/06/2014  . IBS (irritable bowel syndrome) 12/29/2016  . Insomnia 08/19/2011  . Post menopausal syndrome 03/06/2014    Family History  Problem Relation Age of Onset  . Cancer Mother   . Colon cancer Mother   . Diabetes Father   . Heart disease Father   . Cancer Maternal Grandmother   . Diabetes Paternal Grandfather     Past Surgical History:  Procedure Laterality Date  . ABDOMINAL HYSTERECTOMY    . CESAREAN SECTION    . DILATION AND CURETTAGE OF UTERUS    . GALLBLADDER SURGERY    . SPINE SURGERY     Social History   Occupational History  . Not on file  Tobacco Use  . Smoking status: Former Research scientist (life sciences)  . Smokeless tobacco: Never Used  Substance and Sexual Activity  . Alcohol use: Yes    Alcohol/week: 7.0 standard drinks    Types: 7 Glasses of wine per week  . Drug use: No  . Sexual activity: Yes    Partners: Male    Birth control/protection: Surgical

## 2018-04-21 ENCOUNTER — Other Ambulatory Visit (INDEPENDENT_AMBULATORY_CARE_PROVIDER_SITE_OTHER): Payer: Self-pay | Admitting: Physician Assistant

## 2018-04-21 MED FILL — LINZESS 290 MCG CAPSULE: 290 | 90 days supply | Qty: 90 | Fill #3

## 2018-04-28 ENCOUNTER — Other Ambulatory Visit: Payer: Self-pay

## 2018-04-28 ENCOUNTER — Telehealth: Payer: Self-pay | Admitting: Surgical

## 2018-04-28 MED ORDER — GABAPENTIN 300 MG PO CAPS: 900.0000 mg | ORAL_CAPSULE | Freq: Every day | ORAL | 3 refills | Status: DC

## 2018-04-28 MED ORDER — PREMARIN 0.625 MG PO TABS: 0.6250 mg | ORAL_TABLET | Freq: Every day | ORAL | 3 refills | Status: DC

## 2018-04-28 MED ORDER — GABAPENTIN 300 MG PO CAPS: 900.0000 mg | ORAL_CAPSULE | Freq: Every day | ORAL | 1 refills | Status: DC

## 2018-04-28 MED FILL — GABAPENTIN 300 MG CAPSULE: 300 | 30 days supply | Qty: 90 | Fill #0

## 2018-04-28 MED FILL — PREMARIN 0.625 MG TABLET: 0.625 | 30 days supply | Qty: 30 | Fill #0

## 2018-04-28 NOTE — Telephone Encounter (Signed)
Patient is requesting a refill on Gabapentin and Premarin. OK to refill?

## 2018-04-28 NOTE — Telephone Encounter (Signed)
Patient refills called in

## 2018-04-28 NOTE — Telephone Encounter (Signed)
Okay 

## 2018-04-29 ENCOUNTER — Encounter (HOSPITAL_COMMUNITY): Payer: Self-pay | Admitting: *Deleted

## 2018-04-29 ENCOUNTER — Other Ambulatory Visit: Payer: Self-pay

## 2018-04-30 ENCOUNTER — Encounter (HOSPITAL_COMMUNITY)
Admission: RE | Disposition: A | Discharge status: Home / Self Care | Payer: Self-pay | Source: Ambulatory Visit | Attending: Orthopaedic Surgery

## 2018-04-30 ENCOUNTER — Other Ambulatory Visit: Payer: Self-pay

## 2018-04-30 ENCOUNTER — Ambulatory Visit (HOSPITAL_COMMUNITY)
Admission: RE | Admit: 2018-04-30 | Discharge: 2018-04-30 | Disposition: A | Discharge status: Home / Self Care | Payer: 59 | Source: Ambulatory Visit | Attending: Orthopaedic Surgery | Admitting: Orthopaedic Surgery

## 2018-04-30 ENCOUNTER — Ambulatory Visit (HOSPITAL_COMMUNITY): Payer: 59 | Admitting: Certified Registered Nurse Anesthetist

## 2018-04-30 ENCOUNTER — Encounter (HOSPITAL_COMMUNITY): Payer: Self-pay | Admitting: *Deleted

## 2018-04-30 DIAGNOSIS — Z7951 Long term (current) use of inhaled steroids: Secondary | ICD-10-CM | POA: Diagnosis not present

## 2018-04-30 DIAGNOSIS — S83281A Other tear of lateral meniscus, current injury, right knee, initial encounter: Secondary | ICD-10-CM

## 2018-04-30 DIAGNOSIS — S83242A Other tear of medial meniscus, current injury, left knee, initial encounter: Secondary | ICD-10-CM | POA: Diagnosis not present

## 2018-04-30 DIAGNOSIS — Z79899 Other long term (current) drug therapy: Secondary | ICD-10-CM | POA: Insufficient documentation

## 2018-04-30 DIAGNOSIS — K589 Irritable bowel syndrome without diarrhea: Secondary | ICD-10-CM | POA: Diagnosis not present

## 2018-04-30 DIAGNOSIS — S83282A Other tear of lateral meniscus, current injury, left knee, initial encounter: Secondary | ICD-10-CM | POA: Insufficient documentation

## 2018-04-30 DIAGNOSIS — Z87891 Personal history of nicotine dependence: Secondary | ICD-10-CM | POA: Diagnosis not present

## 2018-04-30 DIAGNOSIS — E039 Hypothyroidism, unspecified: Secondary | ICD-10-CM | POA: Insufficient documentation

## 2018-04-30 DIAGNOSIS — M794 Hypertrophy of (infrapatellar) fat pad: Secondary | ICD-10-CM | POA: Diagnosis not present

## 2018-04-30 DIAGNOSIS — X58XXXA Exposure to other specified factors, initial encounter: Secondary | ICD-10-CM | POA: Diagnosis not present

## 2018-04-30 DIAGNOSIS — S83289A Other tear of lateral meniscus, current injury, unspecified knee, initial encounter: Secondary | ICD-10-CM

## 2018-04-30 DIAGNOSIS — M94262 Chondromalacia, left knee: Secondary | ICD-10-CM | POA: Diagnosis not present

## 2018-04-30 DIAGNOSIS — M659 Synovitis and tenosynovitis, unspecified: Secondary | ICD-10-CM | POA: Insufficient documentation

## 2018-04-30 DIAGNOSIS — M25562 Pain in left knee: Secondary | ICD-10-CM | POA: Diagnosis present

## 2018-04-30 DIAGNOSIS — Z79890 Hormone replacement therapy (postmenopausal): Secondary | ICD-10-CM | POA: Diagnosis not present

## 2018-04-30 HISTORY — PX: KNEE ARTHROSCOPY WITH MEDIAL MENISECTOMY: SHX5651

## 2018-04-30 HISTORY — DX: Pneumonia, unspecified organism: J18.9

## 2018-04-30 HISTORY — DX: Other specified postprocedural states: Z98.890

## 2018-04-30 HISTORY — DX: Nausea with vomiting, unspecified: R11.2

## 2018-04-30 LAB — CBC
HEMATOCRIT: 42.8 % (ref 36.0–46.0)
HEMOGLOBIN: 14.2 g/dL (ref 12.0–15.0)
MCH: 30.4 pg (ref 26.0–34.0)
MCHC: 33.2 g/dL (ref 30.0–36.0)
MCV: 91.6 fL (ref 78.0–100.0)
Platelets: 338 10*3/uL (ref 150–400)
RBC: 4.67 MIL/uL (ref 3.87–5.11)
RDW: 12.2 % (ref 11.5–15.5)
WBC: 7.6 10*3/uL (ref 4.0–10.5)

## 2018-04-30 SURGERY — ARTHROSCOPY, KNEE, WITH MEDIAL MENISCECTOMY
Anesthesia: General | Site: Knee | Laterality: Left

## 2018-04-30 MED ORDER — FENTANYL CITRATE (PF) 100 MCG/2ML IJ SOLN
INTRAMUSCULAR | Status: DC | PRN
  Administered 2018-04-30 (×2): 50 ug via INTRAVENOUS

## 2018-04-30 MED ORDER — DEXAMETHASONE SODIUM PHOSPHATE 10 MG/ML IJ SOLN
INTRAMUSCULAR | Status: AC
  Filled 2018-04-30: qty 1

## 2018-04-30 MED ORDER — DEXAMETHASONE SODIUM PHOSPHATE 10 MG/ML IJ SOLN
INTRAMUSCULAR | Status: DC | PRN
  Administered 2018-04-30: 10 mg via INTRAVENOUS

## 2018-04-30 MED ORDER — ONDANSETRON HCL 4 MG/2ML IJ SOLN: 4.0000 mg | Freq: Once | INTRAMUSCULAR | Status: DC | PRN

## 2018-04-30 MED ORDER — LIDOCAINE 2% (20 MG/ML) 5 ML SYRINGE
INTRAMUSCULAR | Status: DC | PRN
  Administered 2018-04-30: 60 mg via INTRAVENOUS

## 2018-04-30 MED ORDER — PHENYLEPHRINE 40 MCG/ML (10ML) SYRINGE FOR IV PUSH (FOR BLOOD PRESSURE SUPPORT)
PREFILLED_SYRINGE | INTRAVENOUS | Status: AC
  Filled 2018-04-30: qty 10

## 2018-04-30 MED ORDER — MIDAZOLAM HCL 5 MG/5ML IJ SOLN
INTRAMUSCULAR | Status: DC | PRN
  Administered 2018-04-30: 2 mg via INTRAVENOUS

## 2018-04-30 MED ORDER — CHLORHEXIDINE GLUCONATE 4 % EX LIQD: 60.0000 mL | Freq: Once | CUTANEOUS | Status: DC

## 2018-04-30 MED ORDER — HYDROCODONE-ACETAMINOPHEN 5-325 MG PO TABS: 1.0000 | ORAL_TABLET | ORAL | 0 refills | Status: DC | PRN

## 2018-04-30 MED ORDER — CEFAZOLIN SODIUM-DEXTROSE 2-4 GM/100ML-% IV SOLN
2.0000 g | INTRAVENOUS | Status: AC
  Administered 2018-04-30: 2 g via INTRAVENOUS
  Filled 2018-04-30: qty 100

## 2018-04-30 MED ORDER — MORPHINE SULFATE (PF) 4 MG/ML IV SOLN
INTRAVENOUS | Status: AC
  Filled 2018-04-30: qty 1

## 2018-04-30 MED ORDER — LACTATED RINGERS IV SOLN
INTRAVENOUS | Status: DC
  Administered 2018-04-30: 14:00:00 via INTRAVENOUS

## 2018-04-30 MED ORDER — MORPHINE SULFATE (PF) 4 MG/ML IV SOLN
INTRAVENOUS | Status: DC | PRN
  Administered 2018-04-30: 4 mg via INTRAMUSCULAR

## 2018-04-30 MED ORDER — BUPIVACAINE HCL (PF) 0.5 % IJ SOLN
INTRAMUSCULAR | Status: AC
  Filled 2018-04-30: qty 30

## 2018-04-30 MED ORDER — ONDANSETRON HCL 4 MG/2ML IJ SOLN
INTRAMUSCULAR | Status: DC | PRN
  Administered 2018-04-30: 4 mg via INTRAVENOUS

## 2018-04-30 MED ORDER — PROPOFOL 10 MG/ML IV BOLUS
INTRAVENOUS | Status: DC | PRN
  Administered 2018-04-30: 150 mg via INTRAVENOUS

## 2018-04-30 MED ORDER — BUPIVACAINE HCL (PF) 0.5 % IJ SOLN
INTRAMUSCULAR | Status: DC | PRN
  Administered 2018-04-30: 30 mL

## 2018-04-30 MED ORDER — SODIUM CHLORIDE 0.9 % IR SOLN
Status: DC | PRN
  Administered 2018-04-30: 6000 mL

## 2018-04-30 MED ORDER — PHENYLEPHRINE 40 MCG/ML (10ML) SYRINGE FOR IV PUSH (FOR BLOOD PRESSURE SUPPORT)
PREFILLED_SYRINGE | INTRAVENOUS | Status: AC
  Filled 2018-04-30: qty 20

## 2018-04-30 MED ORDER — EPHEDRINE 5 MG/ML INJ
INTRAVENOUS | Status: AC
  Filled 2018-04-30: qty 20

## 2018-04-30 MED ORDER — PHENYLEPHRINE 40 MCG/ML (10ML) SYRINGE FOR IV PUSH (FOR BLOOD PRESSURE SUPPORT)
PREFILLED_SYRINGE | INTRAVENOUS | Status: DC | PRN
  Administered 2018-04-30 (×5): 80 ug via INTRAVENOUS

## 2018-04-30 MED ORDER — MIDAZOLAM HCL 2 MG/2ML IJ SOLN
INTRAMUSCULAR | Status: AC
  Filled 2018-04-30: qty 2

## 2018-04-30 MED ORDER — PROPOFOL 10 MG/ML IV BOLUS
INTRAVENOUS | Status: AC
  Filled 2018-04-30: qty 20

## 2018-04-30 MED ORDER — LIDOCAINE 2% (20 MG/ML) 5 ML SYRINGE
INTRAMUSCULAR | Status: AC
  Filled 2018-04-30: qty 5

## 2018-04-30 MED ORDER — FENTANYL CITRATE (PF) 100 MCG/2ML IJ SOLN
INTRAMUSCULAR | Status: AC
  Filled 2018-04-30: qty 2

## 2018-04-30 MED ORDER — ONDANSETRON HCL 4 MG/2ML IJ SOLN
INTRAMUSCULAR | Status: AC
  Filled 2018-04-30: qty 2

## 2018-04-30 MED ORDER — FENTANYL CITRATE (PF) 100 MCG/2ML IJ SOLN: 25.0000 ug | INTRAMUSCULAR | Status: DC | PRN

## 2018-04-30 MED FILL — HYDROCODON-APAP 5-325: 5-325 | 3 days supply | Qty: 30 | Fill #0

## 2018-04-30 MED FILL — LINZESS 290 MCG CAPSULE: 290 | 90 days supply | Qty: 90 | Fill #3

## 2018-04-30 SURGICAL SUPPLY — 24 items
BANDAGE ACE 6X5 VEL STRL LF (GAUZE/BANDAGES/DRESSINGS) ×2 IMPLANT
BLADE CUDA SHAVER 3.5 (BLADE) ×2 IMPLANT
COVER SURGICAL LIGHT HANDLE (MISCELLANEOUS) ×2 IMPLANT
DRAPE U-SHAPE 47X51 STRL (DRAPES) ×2 IMPLANT
DRSG PAD ABDOMINAL 8X10 ST (GAUZE/BANDAGES/DRESSINGS) ×2 IMPLANT
DURAPREP 26ML APPLICATOR (WOUND CARE) ×2 IMPLANT
GAUZE XEROFORM 1X8 LF (GAUZE/BANDAGES/DRESSINGS) ×2 IMPLANT
GLOVE BIO SURGEON STRL SZ7.5 (GLOVE) ×6 IMPLANT
GLOVE BIOGEL PI IND STRL 8 (GLOVE) ×2 IMPLANT
GLOVE BIOGEL PI INDICATOR 8 (GLOVE) ×2
GLOVE ECLIPSE 8.0 STRL XLNG CF (GLOVE) ×2 IMPLANT
GOWN STRL REUS W/TWL XL LVL3 (GOWN DISPOSABLE) ×4 IMPLANT
KIT BASIN OR (CUSTOM PROCEDURE TRAY) IMPLANT
MANIFOLD NEPTUNE II (INSTRUMENTS) ×2 IMPLANT
PACK ARTHROSCOPY WL (CUSTOM PROCEDURE TRAY) ×2 IMPLANT
PACK ICE MAXI GEL EZY WRAP (MISCELLANEOUS) ×2 IMPLANT
PADDING CAST COTTON 6X4 STRL (CAST SUPPLIES) ×2 IMPLANT
POSITIONER SURGICAL ARM (MISCELLANEOUS) IMPLANT
SUT ETHILON 4 0 PS 2 18 (SUTURE) ×2 IMPLANT
SYR CONTROL 10ML LL (SYRINGE) IMPLANT
TOWEL OR 17X26 10 PK STRL BLUE (TOWEL DISPOSABLE) ×2 IMPLANT
TUBING ARTHRO INFLOW-ONLY STRL (TUBING) ×2 IMPLANT
WAND HAND CNTRL MULTIVAC 90 (MISCELLANEOUS) IMPLANT
WRAP KNEE MAXI GEL POST OP (GAUZE/BANDAGES/DRESSINGS) IMPLANT

## 2018-04-30 NOTE — H&P (Signed)
Kara Kennedy is an 62 y.o. female.   Chief Complaint:   Left knee pain with known meniscal tear HPI:   62 yo female with left knee pain that has failed conservative treatment.  With pain as well as locking and catching, a MRI was obtained showing a lateral meniscal tear and a small medial meniscal tear.  At this point, surgery has been recommended.  Past Medical History:  Diagnosis Date  . Allergic rhinitis 07/30/2012  . Anxiety 03/06/2014  . Chronic neck pain 09/06/2012  . Endometriosis   . Family history of colon cancer 12/29/2016  . History of pyelonephritis 07/30/2012  . Hypothyroidism 03/06/2014  . IBS (irritable bowel syndrome) 12/29/2016  . Insomnia 08/19/2011  . Pneumonia    hx of 2009   . PONV (postoperative nausea and vomiting)   . Post menopausal syndrome 03/06/2014    Past Surgical History:  Procedure Laterality Date  . ABDOMINAL HYSTERECTOMY    . cervical fusion C5-C7     . CESAREAN SECTION    . DILATION AND CURETTAGE OF UTERUS    . GALLBLADDER SURGERY    . SPINE SURGERY      Family History  Problem Relation Age of Onset  . Cancer Mother   . Colon cancer Mother   . Diabetes Father   . Heart disease Father   . Cancer Maternal Grandmother   . Diabetes Paternal Grandfather    Social History:  reports that she has quit smoking. She has never used smokeless tobacco. She reports that she does not drink alcohol or use drugs.  Allergies:  Allergies  Allergen Reactions  . Depo-Medrol [Methylprednisolone Acetate]   . Sulfamethoxazole-Trimethoprim     Medications Prior to Admission  Medication Sig Dispense Refill  . budesonide-formoterol (SYMBICORT) 160-4.5 MCG/ACT inhaler Inhale 2 puffs into the lungs 2 (two) times daily. (Patient taking differently: Inhale 2 puffs into the lungs 2 (two) times daily as needed (spring). ) 1 Inhaler 3  . cholecalciferol (VITAMIN D) 1000 units tablet Take 1,000 Units by mouth daily.    Marland Kitchen docusate sodium (COLACE) 100 MG capsule Take 100 mg  by mouth daily.     Marland Kitchen levothyroxine (SYNTHROID) 88 MCG tablet Take 1 tablet (88 mcg total) by mouth every morning. 90 tablet 1  . linaclotide (LINZESS) 290 MCG CAPS capsule Take 1 capsule (290 mcg total) by mouth daily before breakfast. 90 capsule 3  . gabapentin (NEURONTIN) 300 MG capsule Take 3 capsules (900 mg total) by mouth at bedtime. 90 capsule 3  . PREMARIN 0.625 MG tablet Take 1 tablet (0.625 mg total) by mouth daily. 30 tablet 3    No results found for this or any previous visit (from the past 48 hour(s)). No results found.  Review of Systems  All other systems reviewed and are negative.   There were no vitals taken for this visit. Physical Exam  Constitutional: She is oriented to person, place, and time. She appears well-developed and well-nourished.  HENT:  Head: Normocephalic and atraumatic.  Eyes: Pupils are equal, round, and reactive to light.  Neck: Normal range of motion.  Cardiovascular: Normal rate.  Respiratory: Effort normal.  GI: Soft.  Musculoskeletal:       Left knee: She exhibits decreased range of motion, swelling and abnormal meniscus. Tenderness found. Medial joint line and lateral joint line tenderness noted.  Neurological: She is alert and oriented to person, place, and time.  Skin: Skin is warm and dry.  Psychiatric: She has a normal  mood and affect.     Assessment/Plan Left knee with meniscal tear  To the OR today as an outpatient for a left knee arthroscopy with a partial meniscectomy.  The risks and benefits of surgery have been explained in detail and informed consent is obtained.  Mcarthur Rossetti, MD 04/30/2018, 2:03 PM

## 2018-04-30 NOTE — Anesthesia Postprocedure Evaluation (Signed)
Anesthesia Post Note  Patient: Kara Kennedy  Procedure(s) Performed: LEFT KNEE ARTHROSCOPY WITH DEBRIDEMENT (Left Knee)     Patient location during evaluation: PACU Anesthesia Type: General Level of consciousness: awake and alert Pain management: pain level controlled Vital Signs Assessment: post-procedure vital signs reviewed and stable Respiratory status: spontaneous breathing, nonlabored ventilation, respiratory function stable and patient connected to nasal cannula oxygen Cardiovascular status: blood pressure returned to baseline and stable Postop Assessment: no apparent nausea or vomiting Anesthetic complications: no    Last Vitals:  Vitals:   04/30/18 1600 04/30/18 1630  BP: 132/68 130/70  Pulse: 70 72  Resp: 20 16  Temp: 36.4 C   SpO2: 99% 99%    Last Pain:  Vitals:   04/30/18 1630  TempSrc:   PainSc: 4                  Ryan P Ellender

## 2018-04-30 NOTE — Anesthesia Procedure Notes (Signed)
Procedure Name: LMA Insertion Date/Time: 04/30/2018 2:52 PM Performed by: Mitzie Na, CRNA Pre-anesthesia Checklist: Patient identified, Emergency Drugs available, Suction available, Patient being monitored and Timeout performed Patient Re-evaluated:Patient Re-evaluated prior to induction Oxygen Delivery Method: Circle system utilized Preoxygenation: Pre-oxygenation with 100% oxygen Induction Type: IV induction Ventilation: Mask ventilation without difficulty LMA: LMA inserted LMA Size: 3.0 Number of attempts: 1 Placement Confirmation: positive ETCO2 and breath sounds checked- equal and bilateral Tube secured with: Tape Dental Injury: Teeth and Oropharynx as per pre-operative assessment

## 2018-04-30 NOTE — Brief Op Note (Signed)
04/30/2018  3:35 PM  PATIENT:  Roseanne Reno  62 y.o. female  PRE-OPERATIVE DIAGNOSIS:  left knee medial meniscal tear  POST-OPERATIVE DIAGNOSIS:  left knee medial meniscal tear  PROCEDURE:  Procedure(s): LEFT KNEE ARTHROSCOPY WITH DEBRIDEMENT (Left)  SURGEON:  Surgeon(s) and Role:    Mcarthur Rossetti, MD - Primary  PHYSICIAN ASSISTANT:  Benita Stabile, PA-C  ANESTHESIA:   local and general  EBL:  5 mL   COUNTS:  YES  DICTATION: .Other Dictation: Dictation Number [id number]  PLAN OF CARE: Discharge to home after PACU  PATIENT DISPOSITION:  PACU - hemodynamically stable.   Delay start of Pharmacological VTE agent (>24hrs) due to surgical blood loss or risk of bleeding: no

## 2018-04-30 NOTE — Op Note (Signed)
NAME: ALIZE, BORRAYO MEDICAL RECORD [id number] ACCOUNT [id number] DATE OF BIRTH:12-18-1955 FACILITY: [facility] LOCATION: [facility] PHYSICIAN:Nasira Janusz Kerry Fort, MD  OPERATIVE REPORT  DATE OF PROCEDURE:  04/30/2018  PREOPERATIVE DIAGNOSIS:  Left knee medial and lateral symptomatic meniscal tears.  POSTOPERATIVE DIAGNOSES:   1.  Left knee with only lateral meniscal tear that is stable. 2.  Synovitis, left knee.  PROCEDURE:  Left knee arthroscopy with minimal debridement.  FINDINGS:  Lateral meniscal tear that was horizontal and posterior and peripheral that is stable with reactive synovitis.  SURGEON:  Lind Guest. Ninfa Linden, MD  ASSISTANT:  Janine Ores, PA-C  ANESTHESIA: 1.  General. 2.  Local with a mixture of morphine and Marcaine.  ESTIMATED BLOOD LOSS:  Minimal.  COMPLICATIONS:  None.  ANTIBIOTICS:  Two grams IV Ancef.  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  MRI was eventually obtained after failure of conservative treatment.  It did show this had odd horizontal meniscal  tear of the lateral meniscus more near the popliteal hiatus just peripheral but posterior.  She also had what they felt was a small medial meniscal tear.  They said the cartilage was in good shape and there was some fissuring at the patellofemoral joint,  but otherwise stable knee.  She did continue to have recurrent effusions and pain on rotation of the knee and pivoting as well.  At this point, she has tried and failed all forms of conservative treatment and it was felt it was indicated for an  arthroscopic intervention given of the meniscal tearing on MRI.  Risks and benefits of surgery were explained in detail and she did wish to proceed.  DESCRIPTION OF PROCEDURE:  After informed consent was obtained and appropriate left knee was marked, she was brought to the operating room and placed on the  operating table.  General anesthesia was then obtained.  The lateral leg post-utilized and the  left leg was prepped and draped from the thigh down to the ankle with DuraPrep and sterile drapes including a sterile stockinette with the bed raised left operative knee flexed off the table.  Timeout was called to identify correct patient, correct left  knee.  I then made an anterior lateral arthroscopy portal and inserted a cannula needed just a mild effusion.  I placed the camera in the knee and went into the medial compartment and made an anterior medial incision.  We assessed the medial compartment  and found the cartilage to be pristine and intact.  There were some small area of redundancy of the edge of the meniscus at the midbody area, but this was probed and there was no medial meniscal tear.  I went to the intercondylar area of the knee and  found the ACL and PCL to be intact.  With the knee in a figure-of-four position with the lateral compartment, there was significant synovitis along the posterior horn to midbody of the lateral meniscus and there was a small horizontal meniscal tear, but  it was not unstable.  There was reactive synovitis around that area.  Using arthroscopic shaver, we removed some synovium and synovitis in this area of inflamed tissue.  I probed the meniscus and it did not subluxate into the joint and the popliteus was  intact and I felt it was better to try to create a bleeding response to get this to heal the capsule and I was able to get some bleeding  tissue.  She did have just some grade II change chondromalacia just on the lateral tibial plateau but the remainder  of the cartilage looked good.  Finally we assessed the patellofemoral joint.  She did have a large amount of inflammation at Hoffa's fat pad and definitely impingement from this with some inflamed tissue in the suprapatellar area and I debrided this all  back.  The cartilage underneath the kneecap in the trochlear  groove was also intact.  We then allowed fluid lavage of the knee and drained all the fluid from the knee.  We closed the portal sites with interrupted nylon suture and insert a mixture of  morphine and Marcaine into the portal sites in the knee itself.  Xeroform well-padded sterile dressing was applied.  She was awakened, extubated, and taken to recovery room in stable condition.  All final counts were correct.  There were no complications  noted.  Postoperatively, I will allow her weight bear as tolerates with increasing activity as comfort allows.  She will be discharged to home from the PACU with followup in a week.  TN/NUANCE  D:04/30/2018 T:04/30/2018 JOB:[id number]

## 2018-04-30 NOTE — Anesthesia Preprocedure Evaluation (Addendum)
Anesthesia Evaluation    Reviewed: Allergy & Precautions, Patient's Chart, lab work & pertinent test results  History of Anesthesia Complications (+) PONV and history of anesthetic complications  Airway Mallampati: II  TM Distance: >3 FB Neck ROM: Full    Dental no notable dental hx.    Pulmonary former smoker,    Pulmonary exam normal breath sounds clear to auscultation       Cardiovascular negative cardio ROS Normal cardiovascular exam Rhythm:Regular Rate:Normal     Neuro/Psych Anxiety negative neurological ROS     GI/Hepatic Neg liver ROS, IBS (irritable bowel syndrome)   Endo/Other  Hypothyroidism   Renal/GU negative Renal ROS     Musculoskeletal Chronic neck pain   Abdominal   Peds  Hematology negative hematology ROS (+)   Anesthesia Other Findings left knee medial meniscal tear  Reproductive/Obstetrics                            Anesthesia Physical Anesthesia Plan  ASA: II  Anesthesia Plan: General   Post-op Pain Management:    Induction: Intravenous  PONV Risk Score and Plan: 4 or greater and Midazolam, Dexamethasone, Ondansetron and Treatment may vary due to age or medical condition  Airway Management Planned: LMA  Additional Equipment:   Intra-op Plan:   Post-operative Plan: Extubation in OR  Informed Consent: I have reviewed the patients History and Physical, chart, labs and discussed the procedure including the risks, benefits and alternatives for the proposed anesthesia with the patient or authorized representative who has indicated his/her understanding and acceptance.   Dental advisory given  Plan Discussed with: CRNA  Anesthesia Plan Comments:         Anesthesia Quick Evaluation

## 2018-04-30 NOTE — Discharge Instructions (Signed)
Increase your activities as comfort allows. You may put all of your weight on your left leg. Do expect some swelling - ice and elevation as needed. Pump your feet several times daily for the next 3-4 days. You can remove all of your dressings in 24 hours and place band-aids as needed daily.

## 2018-04-30 NOTE — Transfer of Care (Signed)
Immediate Anesthesia Transfer of Care Note  Patient: Kara Kennedy  Procedure(s) Performed: Procedure(s): LEFT KNEE ARTHROSCOPY WITH DEBRIDEMENT (Left)  Patient Location: PACU  Anesthesia Type:General  Level of Consciousness:  sedated, patient cooperative and responds to stimulation  Airway & Oxygen Therapy:Patient Spontanous Breathing and Patient connected to face mask oxgen  Post-op Assessment:  Report given to PACU RN and Post -op Vital signs reviewed and stable  Post vital signs:  Reviewed and stable  Last Vitals:  Vitals:   04/30/18 1405  BP: 135/80  Pulse: 76  Resp: 16  Temp: 36.6 C  SpO2: 542%    Complications: No apparent anesthesia complications

## 2018-05-06 ENCOUNTER — Encounter (HOSPITAL_COMMUNITY): Payer: Self-pay | Admitting: Orthopaedic Surgery

## 2018-05-10 ENCOUNTER — Ambulatory Visit (INDEPENDENT_AMBULATORY_CARE_PROVIDER_SITE_OTHER): Payer: 59 | Admitting: Physician Assistant

## 2018-05-10 ENCOUNTER — Encounter (INDEPENDENT_AMBULATORY_CARE_PROVIDER_SITE_OTHER): Payer: Self-pay | Admitting: Physician Assistant

## 2018-05-10 DIAGNOSIS — Z9889 Other specified postprocedural states: Secondary | ICD-10-CM

## 2018-05-10 NOTE — Progress Notes (Signed)
HPI: Kara Kennedy returns today status post left knee arthroscopy with debridement now 10 days postop.  She is overall doing well still some soreness in the knee.  She is had no fevers chills shortness of breath chest pain calf pain. Knee arthroscopy showed a lateral meniscal tear that was stable with reactive synovitis.  Grade II chondromalacia changes of the lateral tibial plateau. Physical exam: Left knee good range of motion.  Wart sites well approximated with nylon sutures no signs of infection calf supple nontender.  Impression: Status post left knee arthroscopy  Plan: Sutures removed.  She will work on scar tissue mobilization.  Work on strengthening the knee.  Follow-up with [place] in a month sooner if there is any questions or concerns.

## 2018-05-11 ENCOUNTER — Other Ambulatory Visit: Payer: Self-pay | Admitting: Surgical

## 2018-05-11 ENCOUNTER — Other Ambulatory Visit (INDEPENDENT_AMBULATORY_CARE_PROVIDER_SITE_OTHER): Payer: 59

## 2018-05-11 DIAGNOSIS — R300 Dysuria: Secondary | ICD-10-CM

## 2018-05-11 LAB — POCT URINALYSIS DIPSTICK
Bilirubin, UA: NEGATIVE
Blood, UA: NEGATIVE
Glucose, UA: NEGATIVE
Ketones, UA: NEGATIVE
Leukocytes, UA: NEGATIVE
Nitrite, UA: NEGATIVE
Protein, UA: NEGATIVE
Spec Grav, UA: 1.01 (ref 1.010–1.025)
Urobilinogen, UA: 0.2 E.U./dL
pH, UA: 6 (ref 5.0–8.0)

## 2018-05-12 ENCOUNTER — Other Ambulatory Visit: Payer: Self-pay | Admitting: Surgical

## 2018-05-12 LAB — URINE CULTURE
MICRO NUMBER:: 91238713
SPECIMEN QUALITY:: ADEQUATE

## 2018-05-12 MED ORDER — CEPHALEXIN 500 MG PO CAPS: 500.0000 mg | ORAL_CAPSULE | Freq: Three times a day (TID) | ORAL | 0 refills | Status: DC

## 2018-05-12 MED FILL — CEPHALEXIN 500 MG CAPSULE: 500 | 5 days supply | Qty: 15 | Fill #0

## 2018-05-24 MED FILL — GABAPENTIN 300 MG CAPSULE: 300 | 30 days supply | Qty: 90 | Fill #0

## 2018-05-24 MED FILL — PREMARIN 0.625 MG TABLET: 0.625 | 30 days supply | Qty: 30 | Fill #1

## 2018-05-25 ENCOUNTER — Encounter (INDEPENDENT_AMBULATORY_CARE_PROVIDER_SITE_OTHER): Payer: Self-pay | Admitting: Orthopaedic Surgery

## 2018-05-25 ENCOUNTER — Encounter (INDEPENDENT_AMBULATORY_CARE_PROVIDER_SITE_OTHER): Payer: Self-pay

## 2018-05-27 ENCOUNTER — Other Ambulatory Visit: Payer: Self-pay | Admitting: Family Medicine

## 2018-05-28 MED FILL — SYNTHROID 88 MCG TABLET: 88 | 90 days supply | Qty: 90 | Fill #0

## 2018-06-04 ENCOUNTER — Telehealth: Payer: Self-pay

## 2018-06-04 MED ORDER — DIAZEPAM 5 MG PO TABS: 5.0000 mg | ORAL_TABLET | Freq: Two times a day (BID) | ORAL | 1 refills | Status: DC | PRN

## 2018-06-04 MED FILL — diazePAM 5 MG TABS: 5 | 15 days supply | Qty: 30 | Fill #0

## 2018-06-04 NOTE — Telephone Encounter (Signed)
Sent!

## 2018-06-04 NOTE — Telephone Encounter (Signed)
Please advise 

## 2018-06-04 NOTE — Telephone Encounter (Signed)
Patient needs refill on Valium 5 mg

## 2018-06-08 ENCOUNTER — Encounter: Payer: Self-pay | Admitting: Family Medicine

## 2018-06-08 ENCOUNTER — Ambulatory Visit: Payer: 59 | Admitting: Family Medicine

## 2018-06-08 VITALS — BP 122/80 | HR 62 | Temp 97.5°F | Ht 63.0 in | Wt 148.6 lb

## 2018-06-08 DIAGNOSIS — M130 Polyarthritis, unspecified: Secondary | ICD-10-CM

## 2018-06-08 DIAGNOSIS — M791 Myalgia, unspecified site: Secondary | ICD-10-CM | POA: Diagnosis not present

## 2018-06-08 NOTE — Patient Instructions (Signed)
It was very nice to see you today!  We will check blood work today.  Please take the meloxicam.  Let me know if your symptoms worsen or change in any other way.   Take care, Dr Jerline Pain

## 2018-06-08 NOTE — Progress Notes (Signed)
   Subjective:  Kara Kennedy is a 62 y.o. female who presents today with a chief complaint of joint Kennedy.   HPI:  Joint Kennedy, new problem Started about a week ago.  Worsened over the last day.  She has had achy joints and muscles located in her elbows, shoulders, feet, and knees.  Associated with some stiffness as well.  She has been more fatigued recently as well.  Some chills.  No fevers.  No rashes.  No swelling.  No redness.  No treatments tried.  No obvious alleviating or aggravating factors.  ROS: Per HPI  PMH: She reports that she has quit smoking. She has never used smokeless tobacco. She reports that she does not drink alcohol or use drugs.  Objective:  Physical Exam: BP 122/80 (BP Location: Left Arm, Patient Position: Sitting, Cuff Size: Normal)   Pulse 62   Temp (!) 97.5 F (36.4 C) (Oral)   Ht 5\' 3"  (1.6 m)   Wt 148 lb 9.6 oz (67.4 kg)   LMP  (LMP Unknown)   SpO2 98%   BMI 26.32 kg/m   Gen: NAD, resting comfortably CV: RRR with no murmurs appreciated Pulm: NWOB, CTAB with no crackles, wheezes, or rhonchi GI: Normal bowel sounds present. Soft, Nontender, Nondistended. MSK:  -Upper extremities: No deformities or effusions.  Strength 5 out of 5 throughout.  Sensation light touch intact throughout. - Neck:  No deformities.  Full range of motion. - Back: No deformities.  Full range of motion throughout. -Lower extremities: No deformities or effusions.  Strength 5 out of 5 throughout.  Sensation light touch intact throughout. Skin: Warm, dry.  No rashes Neuro: Grossly normal, moves all extremities Psych: Normal affect and thought content  Assessment/Plan:  Polyarthralgia/myalgia Unclear etiology.  Broad differential.  Possibly viral mediated.  Cannot rule out autoimmune or inflammatory disorders.  Check CBC with differential, CMET, TSH, ANA, rheumatoid factor, CRP, and sed rate.  Recommended good oral hydration.  Recommended activity as tolerated.  Recommended that she  take her meloxicam as well.  Offered prednisone burst however patient declined.    Algis Greenhouse. Kara Pain, MD 06/08/2018 4:34 PM

## 2018-06-09 LAB — CBC WITH DIFFERENTIAL/PLATELET
BASOS PCT: 1.1 % (ref 0.0–3.0)
Basophils Absolute: 0.1 10*3/uL (ref 0.0–0.1)
EOS PCT: 2.4 % (ref 0.0–5.0)
Eosinophils Absolute: 0.2 10*3/uL (ref 0.0–0.7)
HEMATOCRIT: 38.2 % (ref 36.0–46.0)
HEMOGLOBIN: 13.1 g/dL (ref 12.0–15.0)
LYMPHS PCT: 37.4 % (ref 12.0–46.0)
Lymphs Abs: 2.5 10*3/uL (ref 0.7–4.0)
MCHC: 34.3 g/dL (ref 30.0–36.0)
MCV: 88.2 fl (ref 78.0–100.0)
MONO ABS: 0.4 10*3/uL (ref 0.1–1.0)
MONOS PCT: 5.9 % (ref 3.0–12.0)
Neutro Abs: 3.5 10*3/uL (ref 1.4–7.7)
Neutrophils Relative %: 53.2 % (ref 43.0–77.0)
Platelets: 325 10*3/uL (ref 150.0–400.0)
RBC: 4.33 Mil/uL (ref 3.87–5.11)
RDW: 13.1 % (ref 11.5–15.5)
WBC: 6.7 10*3/uL (ref 4.0–10.5)

## 2018-06-09 LAB — COMPREHENSIVE METABOLIC PANEL
ALBUMIN: 4.2 g/dL (ref 3.5–5.2)
ALT: 49 U/L — ABNORMAL HIGH (ref 0–35)
AST: 31 U/L (ref 0–37)
Alkaline Phosphatase: 116 U/L (ref 39–117)
BUN: 10 mg/dL (ref 6–23)
CHLORIDE: 101 meq/L (ref 96–112)
CO2: 31 mEq/L (ref 19–32)
Calcium: 9.3 mg/dL (ref 8.4–10.5)
Creatinine, Ser: 0.71 mg/dL (ref 0.40–1.20)
GFR: 88.32 mL/min (ref >60.00)
Glucose, Bld: 78 mg/dL (ref 70–99)
POTASSIUM: 3.9 meq/L (ref 3.5–5.1)
Sodium: 139 mEq/L (ref 135–145)
Total Bilirubin: 0.2 mg/dL (ref 0.2–1.2)
Total Protein: 6.7 g/dL (ref 6.0–8.3)

## 2018-06-09 LAB — RHEUMATOID FACTOR

## 2018-06-09 LAB — ANA: Anti Nuclear Antibody(ANA): NEGATIVE

## 2018-06-09 LAB — C-REACTIVE PROTEIN: CRP: 0.6 mg/dL (ref 0.5–20.0)

## 2018-06-09 LAB — SEDIMENTATION RATE: SED RATE: 9 mm/h (ref 0–30)

## 2018-06-09 LAB — TSH: TSH: 1.33 u[IU]/mL (ref 0.35–4.50)

## 2018-06-10 NOTE — Progress Notes (Signed)
Please inform patient of the following:  All of her blood work is normal. She does not have any labs consistent with an autoimmune or inflammatory process. Her blood counts, thyroid level, electrolytes, kidney function, and liver function are all normal. It is possible that her symptoms could be a viral syndrome. Hopefully this should resolve over 1-2 weeks. I would like for her to continue taking her meloxicam. Would like for her to let us know if her symptoms worsen or do not improve over the next week or so.  Algis Greenhouse. Jerline Pain, MD 06/10/2018 8:15 AM

## 2018-06-13 ENCOUNTER — Encounter: Payer: Self-pay | Admitting: Family Medicine

## 2018-06-14 ENCOUNTER — Encounter (INDEPENDENT_AMBULATORY_CARE_PROVIDER_SITE_OTHER): Payer: Self-pay | Admitting: Physician Assistant

## 2018-06-14 ENCOUNTER — Ambulatory Visit (INDEPENDENT_AMBULATORY_CARE_PROVIDER_SITE_OTHER): Payer: 59 | Admitting: Physician Assistant

## 2018-06-14 DIAGNOSIS — Z9889 Other specified postprocedural states: Secondary | ICD-10-CM

## 2018-06-14 NOTE — Progress Notes (Signed)
HPI: Kara Kennedy returns today little over 5 weeks status post left knee arthroscopy for a lateral meniscal tear and re-active synovitis.  She is also found to have grade II chondromalacia lateral tibial plateau.  She is overall trending towards improvement.  She still has some achy pain that wakes her up at night but overall is much improved.  She had no fevers chills.  She states she had no drainage or irritation around the port sites.  Physical exam: Left knee full extension full flexion.  Port sites healing well no signs of infection.  Calf supple non tender.   Impression: Status post left knee arthroscopy  Plan: Discussed with her working on quad strengthening and low-impact exercises for strengthening the knee.  Continue scar tissue mobilization.  She will follow-up on as-needed basis.

## 2018-06-28 ENCOUNTER — Encounter: Payer: Self-pay | Admitting: Family Medicine

## 2018-06-28 ENCOUNTER — Ambulatory Visit: Payer: 59 | Admitting: Family Medicine

## 2018-06-28 VITALS — BP 124/82 | HR 67 | Temp 97.8°F | Ht 63.0 in | Wt 145.4 lb

## 2018-06-28 DIAGNOSIS — F902 Attention-deficit hyperactivity disorder, combined type: Secondary | ICD-10-CM

## 2018-06-28 DIAGNOSIS — M47812 Spondylosis without myelopathy or radiculopathy, cervical region: Secondary | ICD-10-CM | POA: Diagnosis not present

## 2018-06-28 MED ORDER — AMPHETAMINE-DEXTROAMPHET ER 20 MG PO CP24: 20.0000 mg | ORAL_CAPSULE | ORAL | 0 refills | Status: DC

## 2018-06-28 MED ORDER — BACLOFEN 10 MG PO TABS: 10.0000 mg | ORAL_TABLET | Freq: Every evening | ORAL | 2 refills | Status: DC | PRN

## 2018-06-28 MED FILL — BACLOFEN 10 MG TABS: 10 | 30 days supply | Qty: 30 | Fill #0

## 2018-06-28 MED FILL — ADDERALL XR 20 MG CAP SA: 20 | 30 days supply | Qty: 30 | Fill #0

## 2018-06-28 NOTE — Progress Notes (Signed)
Kara Kennedy is a 62 y.o. female is here for follow up.  History of Present Illness:   [name], CMA acting as scribe for Dr. [name].   HPI: Patient in office for evaluation for ADHD. She has no diagnosis of ADHD in the past but has a strong family history. She admits to being easily distracted and having trouble completing task.    Adult ADHD Self Report Scale (most recent)    Adult ADHD Self-Report Scale (ASRS-v1.1) Symptom Checklist - 06/28/18 1536      Part A   1. How often do you have trouble wrapping up the final details of a project, once the challenging parts have been done?  (!) Often  2. How often do you have difficulty getting things done in order when you have to do a task that requires organization?  (!) Often    3. How often do you have problems remembering appointments or obligations?  (!) Sometimes  4. When you have a task that requires a lot of thought, how often do you avoid or delay getting started?  (!) Very Often    5. How often do you fidget or squirm with your hands or feet when you have to sit down for a long time?  (!) Often  6. How often do you feel overly active and compelled to do things, like you were driven by a motor?  (!) Very Often      Part B   7. How often do you make careless mistakes when you have to work on a boring or difficult project?  (!) Often  8. How often do you have difficulty keeping your attention when you are doing boring or repetitive work?  (!) Very Often    9. How often do you have difficulty concentrating on what people say to you, even when they are speaking to you directly?  (!) Sometimes  10. How often do you misplace or have difficulty finding things at home or at work?  (!) Often    11. How often are you distracted by activity or noise around you?  (!) Very Often  12. How often do you leave your seat in meetings or other situations in which you are expected to remain seated?  (!) Sometimes    13. How often do you feel  restless or fidgety?  (!) Very Often  14. How often do you have difficulty unwinding and relaxing when you have time to yourself?  (!) Very Often    15. How often do you find yourself talking too much when you are in social situations?  (!) Often  16. When you are in a conversation, how often do you find yourself finishing the sentences of the people you are talking to, before they can finish them themselves?  (!) Sometimes    17. How often do you have difficulty waiting your turn in situations when turn taking is required?  Sometimes  18. How often do you interrupt others when they are busy?  (!) Sometimes      Health Maintenance Due  Topic Date Due  . Hepatitis C Screening  [birth year]  . HIV Screening  04/03/1970  . COLONOSCOPY  04/03/2005  . INFLUENZA VACCINE  02/25/2018   Depression screen PHQ 2/9 03/12/2017  Decreased Interest 0  Down, Depressed, Hopeless 0  PHQ - 2 Score 0   PMHx, SurgHx, SocialHx, FamHx, Medications, and Allergies were reviewed in the Visit Navigator and updated as appropriate.  Patient Active Problem List   Diagnosis Date Noted  . S/P left knee arthroscopy 06/14/2018  . Acute lateral meniscal tear 04/30/2018  . Acute pain of left knee 04/10/2018  . Cervical spondylosis 10/26/2017  . Rotator cuff syndrome of right shoulder 08/25/2017  . Right sided facial pain 03/12/2017  . Vitamin D deficiency 03/09/2017  . Stress fracture of right fibula 02/02/2017  . Right ankle pain 01/02/2017  . IBS (irritable bowel syndrome) 12/29/2016  . Family history of colon cancer 12/29/2016  . Anxiety 03/06/2014  . Hypothyroidism 03/06/2014  . Post menopausal syndrome 03/06/2014  . Chronic neck pain 09/06/2012  . Allergic rhinitis 07/30/2012  . History of pyelonephritis 07/30/2012  . Insomnia 08/19/2011   Social History   Tobacco Use  . Smoking status: Former Research scientist (life sciences)  . Smokeless tobacco: Never Used  Substance Use Topics  . Alcohol use: Never    Alcohol/week: 7.0  standard drinks    Types: 7 Glasses of wine per week    Frequency: Never  . Drug use: No   Current Medications and Allergies:   .  budesonide-formoterol (SYMBICORT) 160-4.5 MCG/ACT inhaler, Inhale 2 puffs into the lungs 2 (two) times daily. (Patient taking differently: Inhale 2 puffs into the lungs 2 (two) times daily as needed (spring). ), Disp: 1 Inhaler, Rfl: 3 .  cholecalciferol (VITAMIN D) 1000 units tablet, Take 1,000 Units by mouth daily., Disp: , Rfl:  .  diazepam (VALIUM) 5 MG tablet, Take 1 tablet (5 mg total) by mouth every 12 (twelve) hours as needed for anxiety., Disp: 30 tablet, Rfl: 1 .  docusate sodium (COLACE) 100 MG capsule, Take 100 mg by mouth daily. , Disp: , Rfl:  .  gabapentin (NEURONTIN) 300 MG capsule, Take 3 capsules (900 mg total) by mouth at bedtime., Disp: 90 capsule, Rfl: 3 .  levothyroxine (SYNTHROID) 88 MCG tablet, Take 1 tablet (88 mcg total) by mouth every morning. -- Office visit needed for further refills, Disp: 90 tablet, Rfl: 0 .  linaclotide (LINZESS) 290 MCG CAPS capsule, Take 1 capsule (290 mcg total) by mouth daily before breakfast., Disp: 90 capsule, Rfl: 3 .  PREMARIN 0.625 MG tablet, Take 1 tablet (0.625 mg total) by mouth daily., Disp: 30 tablet, Rfl: 3   Allergies  Allergen Reactions  . Depo-Medrol [Methylprednisolone Acetate]   . Sulfamethoxazole-Trimethoprim    Review of Systems   Pertinent items are noted in the HPI. Otherwise, a complete ROS is negative.  Vitals:   Vitals:   06/28/18 1459  BP: 124/82  Pulse: 67  Temp: 97.8 F (36.6 C)  TempSrc: Oral  SpO2: 98%  Weight: 145 lb 6.4 oz (66 kg)  Height: 5\' 3"  (1.6 m)     Body mass index is 25.76 kg/m.  Physical Exam:   Physical Exam  Constitutional: She appears well-nourished.  HENT:  Head: Normocephalic and atraumatic.  Eyes: Pupils are equal, round, and reactive to light. EOM are normal.  Neck: Normal range of motion. Neck supple.  Cardiovascular: Normal rate, regular  rhythm, normal heart sounds and intact distal pulses.  Pulmonary/Chest: Effort normal.  Abdominal: Soft.  Skin: Skin is warm.  Psychiatric: She has a normal mood and affect. Her behavior is normal.  Nursing note and vitals reviewed.  Assessment and Plan:   Emika was seen today for follow-up.  Diagnoses and all orders for this visit:  Attention deficit hyperactivity disorder (ADHD), combined type Comments: Plan:  1. Goals:  What improvements would you most like to see?  Decrease symptoms of ADHD that are impairing learning and/or socialization 2. Plans to reach these goals: Treatment with medication, Improve nutrition in diet and Increase daily exercise 3. Medication Management:  Stimulant: see orders. 4. Discussion: The benefits of stimulant medication treatment appear to outweigh the current risks. We discussed risks and benefits of different medications. I recommended and discussed appropriate dietary modifications and routine exercise. Stimulant medication management, careful titration, and dose optimization of stimulant medication was discussed as the treatment option with the best scientific evidence helping reduce ADHD symptoms. There is no cure for ADHD. Stimulant medication side effects: The temporary side effects of stimulants including decreased appetite, sleep disturbance, mood changes, personality changes, ticks, increases in heart rate and blood pressure, abdominal pain, nausea, vomiting, and dry mouth were discussed.  Orders: -     amphetamine-dextroamphetamine (ADDERALL XR) 20 MG 24 hr capsule; Take 1 capsule (20 mg total) by mouth every morning.  Cervical spondylosis Comments: Stop Valium. Okay Baclofen prn. Orders: -     baclofen (LIORESAL) 10 MG tablet; Take 1 tablet (10 mg total) by mouth at bedtime as needed for muscle spasms.  . Orders and follow up as documented in [place], reviewed diet, exercise and weight control, cardiovascular risk and specific lipid/LDL goals  reviewed, reviewed medications and side effects in detail.  . Reviewed expectations re: course of current medical issues. . Outlined signs and symptoms indicating need for more acute intervention. . Patient verbalized understanding and all questions were answered. . Patient received an After Visit Summary.  CMA served as Education administrator during this visit. History, Physical, and Plan performed by medical provider. The above documentation has been reviewed and is accurate and complete. [name], D.O.  [name], DO [place], [facility] 06/29/2018

## 2018-06-30 MED FILL — GABAPENTIN 300 MG CAPSULE: 300 | 30 days supply | Qty: 90 | Fill #1

## 2018-07-11 MED FILL — PREMARIN 0.625 MG TABLET: 0.625 | 30 days supply | Qty: 30 | Fill #2

## 2018-07-16 MED FILL — diazePAM 5 MG TABS: 5 | 15 days supply | Qty: 30 | Fill #1

## 2018-07-23 ENCOUNTER — Encounter: Payer: Self-pay | Admitting: Family Medicine

## 2018-07-23 ENCOUNTER — Ambulatory Visit: Payer: 59 | Admitting: Family Medicine

## 2018-07-23 ENCOUNTER — Ambulatory Visit (INDEPENDENT_AMBULATORY_CARE_PROVIDER_SITE_OTHER): Payer: 59

## 2018-07-23 VITALS — BP 108/66 | HR 68 | Temp 97.7°F | Ht 63.0 in | Wt 141.2 lb

## 2018-07-23 DIAGNOSIS — M546 Pain in thoracic spine: Secondary | ICD-10-CM

## 2018-07-23 MED ORDER — METHYLPREDNISOLONE ACETATE 40 MG/ML IJ SUSP
40.0000 mg | Freq: Once | INTRAMUSCULAR | Status: AC
  Administered 2018-07-23: 40 mg via INTRAMUSCULAR

## 2018-07-23 MED ORDER — MELOXICAM 15 MG PO TABS: 15.0000 mg | ORAL_TABLET | Freq: Every day | ORAL | 0 refills | Status: DC

## 2018-07-23 MED FILL — MELOXICAM 15 MG TABLET: 15 | 30 days supply | Qty: 30 | Fill #0

## 2018-07-23 NOTE — Progress Notes (Signed)
Dr Marigene Ehlers interpretation of your xray report:  The radiologist did not see any additional findings aside from the degenerative changes we discussed during your visit. Please work on the exercises and try the mobic. Let me know or let Dr Juleen China know if your symptoms worsen or do not improve in the next week or so and we can get you in to see Dr [name] or physical therapy.     If you have any additional questions, please give [place] a call or send [place] a message through [place].  Take care, Dr Jerline Pain

## 2018-07-23 NOTE — Patient Instructions (Signed)
It was very nice to see you today!  I think you flared up the arthritis in your back.  We will give you a cortisone shot today.  Please start the Mobic today.  You can continue using heating pads to the area.  Please work on exercises.  Please let me know or let Dr. Juleen China know if your symptoms worsen or do not improve the next few weeks.  We may need to get you into see physical therapy or sports medicine if your symptoms do not improve.  Take care, Dr Jerline Pain

## 2018-07-23 NOTE — Progress Notes (Signed)
   Subjective:  Kara Kennedy is a 62 y.o. female who presents today for same-day appointment with a chief complaint of back pain.   HPI:  Back Pain, Acute problem Started about 4 days ago. Located in mid low back.  She has had symptoms in that area on and off for several months, but is worsened over the last 4 days.  No clear or obvious precipitating event, but does note that she has been much more active lately due to the holidays and thinks that this could have exacerbated her symptoms.  She took Tylenol which did not help with her symptoms.  She had some leftover tramadol and hydrocodone both of which helped with her pain however effects wore off after about 4 to 6 hours. Occasionally has some radiation into her groin. No weakness or numbness. No bowel or bladder incontinence.   ROS: Per HPI  PMH: She reports that she has quit smoking. She has never used smokeless tobacco. She reports that she does not drink alcohol or use drugs.  Objective:  Physical Exam: BP 108/66 (BP Location: Left Arm, Patient Position: Sitting, Cuff Size: Normal)   Pulse 68   Temp 97.7 F (36.5 C) (Oral)   Ht 5\' 3"  (1.6 m)   Wt 141 lb 3.2 oz (64 kg)   LMP  (LMP Unknown)   SpO2 98%   BMI 25.01 kg/m   Gen: NAD, resting comfortably MSK: -Back: No deformities.  Tender to palpation along midline lower thoracic and lumbar lumbar spinous processes.  No paraspinal muscle tenderness.  Full range of motion throughout. -Lower extremities: No deformities.  Strength 5/5 in all directions.  Sensation light touch intact throughout.  Straight leg raise negative bilaterally.  Assessment/Plan:  Midline Back Pain No red flags.  She has point tenderness on exam but no other concerning signs.  Plain film today with evidence of degenerative disc disease in her upper lumbar spine based on my read.  Will await radiology read.  Think that her symptoms are most likely due to flareup of her underlying degenerative changes.  We will  give 40 mg of IM Depo-Medrol today.  Start meloxicam 15 mg daily.  She has baclofen 10 mg already prescribed.  Handout given with home exercise program.  Recommended warm heating pad to the area as well.  Discussed reasons to return to care and seek emergent care.  Would consider referral to physical therapy and/or sports medicine if symptoms worsen or do not improve the next 1 to 2 weeks.  Low back pain  Melek Pownall M. Jerline Pain, MD 07/23/2018 11:58 AM

## 2018-07-26 MED FILL — BACLOFEN 10 MG TABS: 10 | 30 days supply | Qty: 30 | Fill #1

## 2018-07-27 ENCOUNTER — Other Ambulatory Visit: Payer: Self-pay | Admitting: Family Medicine

## 2018-07-27 MED FILL — GABAPENTIN 300 MG CAPSULE: 300 | 30 days supply | Qty: 90 | Fill #2

## 2018-07-29 ENCOUNTER — Encounter: Payer: Self-pay | Admitting: Family Medicine

## 2018-07-30 ENCOUNTER — Encounter: Payer: Self-pay | Admitting: Family Medicine

## 2018-07-30 ENCOUNTER — Ambulatory Visit: Payer: 59 | Admitting: Family Medicine

## 2018-07-30 VITALS — BP 120/80 | HR 74 | Temp 97.7°F | Wt 141.0 lb

## 2018-07-30 DIAGNOSIS — G8929 Other chronic pain: Secondary | ICD-10-CM | POA: Diagnosis not present

## 2018-07-30 DIAGNOSIS — F902 Attention-deficit hyperactivity disorder, combined type: Secondary | ICD-10-CM | POA: Diagnosis not present

## 2018-07-30 DIAGNOSIS — M545 Low back pain, unspecified: Secondary | ICD-10-CM

## 2018-07-30 DIAGNOSIS — Z111 Encounter for screening for respiratory tuberculosis: Secondary | ICD-10-CM

## 2018-07-30 DIAGNOSIS — M546 Pain in thoracic spine: Secondary | ICD-10-CM | POA: Diagnosis not present

## 2018-07-30 DIAGNOSIS — M47812 Spondylosis without myelopathy or radiculopathy, cervical region: Secondary | ICD-10-CM

## 2018-07-30 MED ORDER — LISDEXAMFETAMINE DIMESYLATE 30 MG PO CAPS: 30.0000 mg | ORAL_CAPSULE | Freq: Every day | ORAL | 0 refills | Status: DC

## 2018-07-30 MED FILL — VYVANSE 30 MG CAPSULE: 30 | 30 days supply | Qty: 30 | Fill #0

## 2018-07-30 NOTE — Progress Notes (Signed)
Kara Kennedy is a 63 y.o. female is here for follow up.  History of Present Illness:   Kara Kennedy, CMA, scribe for Dr. Juleen Kennedy.  HPI: Since the last visit has the patient had any:  Appetite changes? Yes slight decrease Unintentional weight loss? No Is medication working well ? Yes but she notice that she "crashes" near end of day. Does patient take drug holidays? Yes Difficulties falling to sleep or maintaining sleep? No Any anxiety?  No Pt stated that it has decrease some Any cardiac issues (fainting or paliptations)? No Suicidal thoughts? No Changes in health since last visit? No New medications? No Any illicit substance abuse? No  Has the patient taken his medication today? Yes  Health Maintenance Due  Topic Date Due  . Hepatitis C Screening  1956-07-04  . HIV Screening  04/03/1970  . COLONOSCOPY  04/03/2005  . INFLUENZA VACCINE  02/25/2018   Depression screen PHQ 2/9 03/12/2017  Decreased Interest 0  Down, Depressed, Hopeless 0  PHQ - 2 Score 0   PMHx, SurgHx, SocialHx, FamHx, Medications, and Allergies were reviewed in the Visit Navigator and updated as appropriate.   Patient Active Problem List   Diagnosis Date Noted  . S/P left knee arthroscopy 06/14/2018  . Acute lateral meniscal tear 04/30/2018  . Acute Kennedy of left knee 04/10/2018  . Cervical spondylosis 10/26/2017  . Rotator cuff syndrome of right shoulder 08/25/2017  . Right sided facial Kennedy 03/12/2017  . Vitamin D deficiency 03/09/2017  . Stress fracture of right fibula 02/02/2017  . Right ankle Kennedy 01/02/2017  . IBS (irritable bowel syndrome) 12/29/2016  . Family history of colon cancer 12/29/2016  . Anxiety 03/06/2014  . Hypothyroidism 03/06/2014  . Post menopausal syndrome 03/06/2014  . Chronic neck Kennedy 09/06/2012  . Allergic rhinitis 07/30/2012  . History of pyelonephritis 07/30/2012  . Insomnia 08/19/2011   Social History   Tobacco Use  . Smoking status: Former Research scientist (life sciences)  .  Smokeless tobacco: Never Used  Substance Use Topics  . Alcohol use: Never    Alcohol/week: 7.0 standard drinks    Types: 7 Glasses of wine per week    Frequency: Never  . Drug use: No   Current Medications and Allergies:   .  amphetamine-dextroamphetamine (ADDERALL XR) 20 MG 24 hr capsule, Take 1 capsule (20 mg total) by mouth every morning., Disp: 30 capsule, Rfl: 0 .  baclofen (LIORESAL) 10 MG tablet, Take 1 tablet (10 mg total) by mouth at bedtime as needed for muscle spasms., Disp: 30 each, Rfl: 2 .  budesonide-formoterol (SYMBICORT) 160-4.5 MCG/ACT inhaler, Inhale 2 puffs into the lungs 2 (two) times daily. (Patient taking differently: Inhale 2 puffs into the lungs 2 (two) times daily as needed (spring). ), Disp: 1 Inhaler, Rfl: 3 .  cholecalciferol (VITAMIN D) 1000 units tablet, Take 1,000 Units by mouth daily., Disp: , Rfl:  .  docusate sodium (COLACE) 100 MG capsule, Take 100 mg by mouth daily. , Disp: , Rfl:  .  gabapentin (NEURONTIN) 300 MG capsule, Take 3 capsules (900 mg total) by mouth at bedtime., Disp: 90 capsule, Rfl: 3 .  levothyroxine (SYNTHROID) 88 MCG tablet, Take 1 tablet (88 mcg total) by mouth every morning. -- Office visit needed for further refills, Disp: 90 tablet, Rfl: 0 .  LINZESS 290 MCG CAPS capsule, TAKE 1 CAPSULE BY MOUTH ONCE DAILY BEFORE BREAKFAST, Disp: 90 capsule, Rfl: 3 .  meloxicam (MOBIC) 15 MG tablet, Take 1 tablet (15 mg total) by  mouth daily., Disp: 30 tablet, Rfl: 0 .  PREMARIN 0.625 MG tablet, Take 1 tablet (0.625 mg total) by mouth daily., Disp: 30 tablet, Rfl: 3   Allergies  Allergen Reactions  . Depo-Medrol [Methylprednisolone Acetate]   . Sulfamethoxazole-Trimethoprim    Review of Systems   Pertinent items are noted in the HPI. Otherwise, a complete ROS is negative.  Vitals:   Vitals:   07/30/18 1556  BP: 120/80  Pulse: 74  Temp: 97.7 F (36.5 C)  TempSrc: Oral  SpO2: 99%  Weight: 141 lb (64 kg)     Body mass index is 24.98  kg/m.  Physical Exam:   Physical Exam Vitals signs and nursing note reviewed.  HENT:     Kennedy: Normocephalic and atraumatic.  Eyes:     Pupils: Pupils are equal, round, and reactive to light.  Neck:     Musculoskeletal: Normal range of motion and neck supple.  Cardiovascular:     Rate and Rhythm: Normal rate and regular rhythm.     Heart sounds: Normal heart sounds.  Pulmonary:     Effort: Pulmonary effort is normal.  Abdominal:     Palpations: Abdomen is soft.  Skin:    General: Skin is warm.  Psychiatric:        Behavior: Behavior normal.    Assessment and Plan:   Kara Kennedy was seen today for follow-up.  Diagnoses and all orders for this visit:  Attention deficit hyperactivity disorder (ADHD), combined type Comments: Not at goal. Tolerates Adderall, but crashing. Will trial Vyvanse.  Orders: -     lisdexamfetamine (VYVANSE) 30 MG capsule; Take 1 capsule (30 mg total) by mouth daily before breakfast. -     lisdexamfetamine (VYVANSE) 30 MG capsule; Take 1 capsule (30 mg total) by mouth daily before breakfast. -     lisdexamfetamine (VYVANSE) 30 MG capsule; Take 1 capsule (30 mg total) by mouth daily before breakfast.  Screening for tuberculosis -     QuantiFERON-TB Gold Plus  Cervical spondylosis  Chronic midline low back Kennedy without sciatica -     Ambulatory referral to Physical Medicine Rehab  Chronic midline thoracic back Kennedy Comments: With Hx of cervical and lumbar fusions. Discussed last visit with Dr. Jerline Kennedy. Frequent flares of back Kennedy (and resp infections) requiring too much pred this year. Will see if PMR can offer help.    . Orders and follow up as documented in [place], reviewed diet, exercise and weight control, cardiovascular risk and specific lipid/LDL goals reviewed, reviewed medications and side effects in detail.  . Reviewed expectations re: course of current medical issues. . Outlined signs and symptoms indicating need for more acute  intervention. . Patient verbalized understanding and all questions were answered. . Patient received an After Visit Summary.  CMA served as Education administrator during this visit. History, Physical, and Plan performed by medical provider. The above documentation has been reviewed and is accurate and complete. [name], D.O.  [name], DO [place], [facility] 07/31/2018

## 2018-07-31 ENCOUNTER — Encounter: Payer: Self-pay | Admitting: Family Medicine

## 2018-08-01 LAB — QUANTIFERON-TB GOLD PLUS
Mitogen-NIL: 6.42 IU/mL
NIL: 0.02 IU/mL
QuantiFERON-TB Gold Plus: NEGATIVE
TB1-NIL: 0 IU/mL
TB2-NIL: 0 IU/mL

## 2018-08-09 MED FILL — PREMARIN 0.625 MG TABLET: 0.625 | 30 days supply | Qty: 30 | Fill #3

## 2018-08-17 ENCOUNTER — Ambulatory Visit (INDEPENDENT_AMBULATORY_CARE_PROVIDER_SITE_OTHER): Payer: 59 | Admitting: Physical Medicine and Rehabilitation

## 2018-08-17 ENCOUNTER — Encounter (INDEPENDENT_AMBULATORY_CARE_PROVIDER_SITE_OTHER): Payer: Self-pay | Admitting: Physical Medicine and Rehabilitation

## 2018-08-17 ENCOUNTER — Ambulatory Visit (INDEPENDENT_AMBULATORY_CARE_PROVIDER_SITE_OTHER): Payer: 59

## 2018-08-17 VITALS — BP 133/69 | HR 70 | Ht 63.0 in | Wt 134.0 lb

## 2018-08-17 DIAGNOSIS — M545 Low back pain, unspecified: Secondary | ICD-10-CM

## 2018-08-17 DIAGNOSIS — M961 Postlaminectomy syndrome, not elsewhere classified: Secondary | ICD-10-CM | POA: Diagnosis not present

## 2018-08-17 DIAGNOSIS — G8929 Other chronic pain: Secondary | ICD-10-CM

## 2018-08-17 DIAGNOSIS — M7918 Myalgia, other site: Secondary | ICD-10-CM | POA: Diagnosis not present

## 2018-08-17 NOTE — Progress Notes (Signed)
Kara Kennedy - 63 y.o. female MRN [id number]  Date of birth: [birth year]  Office Visit Note: Visit Date: 08/17/2018 PCP: [name], DO Referred by: [name], DO  Subjective: Chief Complaint  Patient presents with  . Middle Back - Pain  . Lower Back - Pain   HPI: Kara Kennedy is a 63 y.o. female who comes in today The request of [name], MD for evaluation and management of worsening mid back and low back pain.  Patient has a history of prior cervical and lumbar fusion.  Lumbar fusion was completed I believe in 1999 by Dr. Trenton Gammon.  Last MRI that we can find is 2007.  This did show fusion at that level without any adjacent level disease.  She has been followed in our office from an orthopedic standpoint for her knee by Dr. Jean Rosenthal and his assistant Benita Stabile, PA-C.  She reports worsening pain about 4 months ago.  She really does not report any specific incident or onset of that.  She does work as a Designer, jewellery at [employer].  She did receive a general cortisone injection by Dr. Juleen China that helped only for a few days.  She reports worsening with bending forward and somewhat with standing.  Laying on her back seems to help with the pain.  Resting and some medication tends to help.  She does take gabapentin.  She describes this as a constant dull and aching pain.  No referral pattern down the legs no red flag complaints of paresthesias or focal weakness.  No bowel or bladder difficulties.  Because she really had not had recent x-ray of the lumbar spine we did perform that today and this is reviewed with the patient and reviewed below.  Review of Systems  Constitutional: Negative for chills, fever, malaise/fatigue and weight loss.  HENT: Negative for hearing loss and sinus pain.   Eyes: Negative for blurred vision, double vision and photophobia.  Respiratory: Negative for cough and shortness of breath.   Cardiovascular: Negative for chest pain, palpitations and  leg swelling.  Gastrointestinal: Negative for abdominal pain, nausea and vomiting.  Genitourinary: Negative for flank pain.  Musculoskeletal: Positive for back pain. Negative for myalgias.  Skin: Negative for itching and rash.  Neurological: Negative for tremors, focal weakness and weakness.  Endo/Heme/Allergies: Negative.   Psychiatric/Behavioral: Negative for depression.  All other systems reviewed and are negative.  Otherwise per HPI.  Assessment & Plan: Visit Diagnoses:  1. Chronic midline low back pain without sciatica   2. Post laminectomy syndrome   3. Myofascial pain syndrome     Plan: Findings:  Worsening 4 months of low back pain and some mid to upper lumbar pain in the setting of prior lumbar fusion at L5-S1 which was done remotely without any new advanced imaging but with x-ray showing increased lordosis but not horrible with adjacent level facet arthropathy.  Her pain is probably multifactorial from adjacent level facet arthropathy may be related to the SI joints below the fusion.  She does have myofascial pain as well with focal trigger points.  We talked about treatment for myofascial pain as well as potential for some other form of injection probably above the fusion or below a sacroiliac joint injection diagnostically.  The next step would be MRI of the lumbar spine since this is not been done long while.  I do believe her middle back pain is likely myofascial.  We talked about increasing the gabapentin slowly.  We talked  about exercises and core strengthening which would be crucial for her in the future.    Meds & Orders: No orders of the defined types were placed in this encounter.   Orders Placed This Encounter  Procedures  . XR Lumbar Spine 2-3 Views    Follow-up: Return if symptoms worsen or fail to improve.   Procedures: No procedures performed  No notes on file   Clinical History: No specialty comments available.   She reports that she has quit smoking. She  has never used smokeless tobacco. No results for input(s): HGBA1C, LABURIC in the last 8760 hours.  Objective:  VS:  HT:_0  (160 cm)   WT:134 lb (60.8 kg)  BMI:23.74    BP:133/69  HR:70bpm  TEMP: ( )  RESP:  Physical Exam Vitals signs and nursing note reviewed.  Constitutional:      General: She is not in acute distress.    Appearance: Normal appearance. She is well-developed.  HENT:     Head: Normocephalic and atraumatic.     Nose: Nose normal.     Mouth/Throat:     Mouth: Mucous membranes are moist.     Pharynx: Oropharynx is clear.  Eyes:     Conjunctiva/sclera: Conjunctivae normal.     Pupils: Pupils are equal, round, and reactive to light.  Neck:     Musculoskeletal: Normal range of motion and neck supple.  Cardiovascular:     Rate and Rhythm: Regular rhythm.  Pulmonary:     Effort: Pulmonary effort is normal. No respiratory distress.  Abdominal:     General: There is no distension.     Palpations: Abdomen is soft.     Tenderness: There is no guarding.  Musculoskeletal:     Right lower leg: No edema.     Left lower leg: No edema.     Comments: Patient somewhat slow to arise from seated position to full extension and she does have some concordant back pain with facet loading.  She has myofascial taut bands in the paraspinal musculature and in the quadratus lumborum bilaterally.  She has no pain with hip rotation she has good distal strength.  Skin:    General: Skin is warm and dry.     Findings: No erythema or rash.  Neurological:     General: No focal deficit present.     Mental Status: She is alert and oriented to person, place, and time.     Motor: No abnormal muscle tone.     Coordination: Coordination normal.     Gait: Gait normal.  Psychiatric:        Mood and Affect: Mood normal.        Behavior: Behavior normal.        Thought Content: Thought content normal.     Ortho Exam Imaging: AP and lateral lumbar spine:AP and lateral lumbar x-ray shows  exaggerated lumbar lordosis with posterior instrumented fusion at L5-S1.  There is small listhesis at L5 on S1.  There is adjacent level facet arthropathy at L4--5 with by foraminal narrowing and arthropathy at L3-4.  Disc heights are fairly well-maintained.  Sacroiliac joints look well-maintained without sclerosis.  Hip joints show mild arthritic change of the left more than right.  Past Medical/Family/Surgical/Social History: Medications & Allergies reviewed per EMR, new medications updated. Patient Active Problem List   Diagnosis Date Noted  . Colon polyps   . Family history of polyps in the colon   . Right shoulder pain 04/10/2019  . Acromioclavicular  joint arthritis 02/11/2019  . Arthritis of right shoulder region 11/29/2018  . Seasonal allergies 11/02/2018  . Attention deficit hyperactivity disorder (ADHD), combined type 11/02/2018  . S/P left knee arthroscopy 06/14/2018  . Acute lateral meniscal tear 04/30/2018  . Acute pain of left knee 04/10/2018  . Cervical spondylosis 10/26/2017  . Rotator cuff syndrome of right shoulder 08/25/2017  . Right sided facial pain 03/12/2017  . Vitamin D deficiency 03/09/2017  . Stress fracture of right fibula 02/02/2017  . Right ankle pain 01/02/2017  . IBS (irritable bowel syndrome) 12/29/2016  . Family history of colon cancer 12/29/2016  . Anxiety 03/06/2014  . Hypothyroidism 03/06/2014  . Post menopausal syndrome 03/06/2014  . Chronic neck pain 09/06/2012  . Allergic rhinitis 07/30/2012  . History of pyelonephritis 07/30/2012  . Insomnia 08/19/2011   Past Medical History:  Diagnosis Date  . Allergic rhinitis 07/30/2012  . Allergy   . Anxiety 03/06/2014  . Chronic neck pain 09/06/2012  . Colon polyps   . Endometriosis   . Family history of colon cancer 12/29/2016  . Family history of polyps in the colon   . History of pyelonephritis 07/30/2012  . Hypothyroidism 03/06/2014  . IBS (irritable bowel syndrome) 12/29/2016  . Insomnia 08/19/2011  .  Pneumonia    hx of 2009   . PONV (postoperative nausea and vomiting)   . Post menopausal syndrome 03/06/2014   Family History  Problem Relation Age of Onset  . Colon cancer Mother 54       d. 65  . Diabetes Father   . Heart disease Father   . Brain cancer Maternal Grandmother 74       d. 73  . Diabetes Paternal Grandfather   . Colon polyps Sister   . Other Brother        RAD51D pos  . Colon polyps Brother   . Colon polyps Brother   . Liver cancer Brother   . Esophageal cancer Neg Hx   . Rectal cancer Neg Hx   . Stomach cancer Neg Hx    Past Surgical History:  Procedure Laterality Date  . ABDOMINAL HYSTERECTOMY    . cervical fusion C5-C7     . CESAREAN SECTION    . DILATION AND CURETTAGE OF UTERUS    . GALLBLADDER SURGERY    . KNEE ARTHROSCOPY WITH MEDIAL MENISECTOMY Left 04/30/2018   Procedure: LEFT KNEE ARTHROSCOPY WITH DEBRIDEMENT;  Surgeon: Mcarthur Rossetti, MD;  Location: [facility];  Service: Orthopedics;  Laterality: Left;  . SPINE SURGERY     Social History   Occupational History  . Not on file  Tobacco Use  . Smoking status: Former Research scientist (life sciences)  . Smokeless tobacco: Never Used  . Tobacco comment: Quit 1999  Substance and Sexual Activity  . Alcohol use: Yes    Alcohol/week: 7.0 standard drinks    Types: 7 Glasses of wine per week    Frequency: Never    Comment: rare  . Drug use: No  . Sexual activity: Yes    Partners: Male    Birth control/protection: Surgical

## 2018-08-17 NOTE — Progress Notes (Signed)
 .  Numeric Pain Rating Scale and Functional Assessment Average Pain 6 Pain Right Now 7 My pain is constant, dull and aching Pain is worse with: bending Pain improves with: rest   In the last MONTH (on 0-10 scale) has pain interfered with the following?  1. General activity like being  able to carry out your everyday physical activities such as walking, climbing stairs, carrying groceries, or moving a chair?  Rating(7)  2. Relation with others like being able to carry out your usual social activities and roles such as  activities at home, at work and in your community. Rating(7)  3. Enjoyment of life such that you have  been bothered by emotional problems such as feeling anxious, depressed or irritable?  Rating(2)

## 2018-08-20 MED FILL — GABAPENTIN 300 MG CAPSULE: 300 | 30 days supply | Qty: 90 | Fill #3

## 2018-08-20 MED FILL — BACLOFEN 10 MG TABS: 10 | 30 days supply | Qty: 30 | Fill #2

## 2018-08-26 ENCOUNTER — Encounter: Payer: Self-pay | Admitting: Family Medicine

## 2018-08-26 ENCOUNTER — Encounter (INDEPENDENT_AMBULATORY_CARE_PROVIDER_SITE_OTHER): Payer: Self-pay | Admitting: Physical Medicine and Rehabilitation

## 2018-08-26 ENCOUNTER — Other Ambulatory Visit: Payer: Self-pay

## 2018-08-26 MED ORDER — LEVOTHYROXINE SODIUM 88 MCG PO TABS: 88.0000 ug | ORAL_TABLET | Freq: Every morning | ORAL | 0 refills | Status: DC

## 2018-08-26 MED FILL — LEVOTHYROXINE 88 MCG TABLET: 88 | 90 days supply | Qty: 90 | Fill #0

## 2018-08-26 NOTE — Telephone Encounter (Signed)
Pt sent Mychart message for refill of synthroid.

## 2018-08-29 NOTE — Progress Notes (Signed)
Kara Kennedy is a 63 y.o. female is here for follow up.  History of Present Illness:   Kara Kennedy, CMA acting as scribe for Dr. Briscoe Kennedy.   HPI: Since the last visit has the patient had any:  Appetite changes? No Unintentional weight loss? No Is medication working well ? Yes Does patient take drug holidays? No Difficulties falling to sleep or maintaining sleep? No Any anxiety?  No Any cardiac issues (fainting or paliptations)? No Suicidal thoughts? No Changes in health since last visit? No New medications? No Any illicit substance abuse? No Has the patient taken his medication today? Yes  Health Maintenance Due  Topic Date Due  . COLONOSCOPY  04/03/2005   Depression screen [facility] 2/9 08/30/2018 03/12/2017  Decreased Interest 0 0  Down, Depressed, Hopeless 0 0  PHQ - 2 Score 0 0  Altered sleeping 0 -  Tired, decreased energy 0 -  Change in appetite 0 -  Feeling bad or failure about yourself  0 -  Trouble concentrating 1 -  Moving slowly or fidgety/restless 0 -  Suicidal thoughts 0 -  PHQ-9 Score 1 -  Difficult doing work/chores Somewhat difficult -   PMHx, SurgHx, SocialHx, FamHx, Medications, and Allergies were reviewed in the Visit Navigator and updated as appropriate.   Patient Active Problem List   Diagnosis Date Noted  . S/P left knee arthroscopy 06/14/2018  . Acute lateral meniscal tear 04/30/2018  . Acute pain of left knee 04/10/2018  . Cervical spondylosis 10/26/2017  . Rotator cuff syndrome of right shoulder 08/25/2017  . Right sided facial pain 03/12/2017  . Vitamin D deficiency 03/09/2017  . Stress fracture of right fibula 02/02/2017  . Right ankle pain 01/02/2017  . IBS (irritable bowel syndrome) 12/29/2016  . Family history of colon cancer 12/29/2016  . Anxiety 03/06/2014  . Hypothyroidism 03/06/2014  . Post menopausal syndrome 03/06/2014  . Chronic neck pain 09/06/2012  . Allergic rhinitis 07/30/2012  . History of pyelonephritis  07/30/2012  . Insomnia 08/19/2011   Social History   Tobacco Use  . Smoking status: Former Research scientist (life sciences)  . Smokeless tobacco: Never Used  Substance Use Topics  . Alcohol use: Never    Alcohol/week: 7.0 standard drinks    Types: 7 Glasses of wine per week    Frequency: Never  . Drug use: No   Current Medications and Allergies   .  amphetamine-dextroamphetamine (ADDERALL XR) 20 MG 24 hr capsule, Take 1 capsule (20 mg total) by mouth every morning., Disp: 30 capsule, Rfl: 0 .  baclofen (LIORESAL) 10 MG tablet, Take 1 tablet (10 mg total) by mouth at bedtime as needed for muscle spasms., Disp: 30 each, Rfl: 2 .  budesonide-formoterol (SYMBICORT) 160-4.5 MCG/ACT inhaler, Inhale 2 puffs into the lungs 2 (two) times daily. (Patient taking differently: Inhale 2 puffs into the lungs 2 (two) times daily as needed (spring). ), Disp: 1 Inhaler, Rfl: 3 .  cholecalciferol (VITAMIN D) 1000 units tablet, Take 1,000 Units by mouth daily., Disp: , Rfl:  .  docusate sodium (COLACE) 100 MG capsule, Take 100 mg by mouth daily. , Disp: , Rfl:  .  gabapentin (NEURONTIN) 300 MG capsule, Take 3 capsules (900 mg total) by mouth at bedtime., Disp: 90 capsule, Rfl: 3 .  levothyroxine (SYNTHROID) 88 MCG tablet, Take 1 tablet (88 mcg total) by mouth every morning., Disp: 90 tablet, Rfl: 0 .  LINZESS 290 MCG CAPS capsule, TAKE 1 CAPSULE BY MOUTH ONCE DAILY BEFORE BREAKFAST, Disp:  90 capsule, Rfl: 3 .  lisdexamfetamine (VYVANSE) 30 MG capsule, Take 1 capsule (30 mg total) by mouth daily before breakfast., Disp: 30 capsule, Rfl: 0 .  [START ON 08/30/2018] lisdexamfetamine (VYVANSE) 30 MG capsule, Take 1 capsule (30 mg total) by mouth daily before breakfast., Disp: 30 capsule, Rfl: 0 .  [START ON 09/30/2018] lisdexamfetamine (VYVANSE) 30 MG capsule, Take 1 capsule (30 mg total) by mouth daily before breakfast., Disp: 30 capsule, Rfl: 0 .  meloxicam (MOBIC) 15 MG tablet, Take 1 tablet (15 mg total) by mouth daily., Disp: 30 tablet,  Rfl: 0 .  PREMARIN 0.625 MG tablet, Take 1 tablet (0.625 mg total) by mouth daily., Disp: 30 tablet, Rfl: 3   Allergies  Allergen Reactions  . Depo-Medrol [Methylprednisolone Acetate]   . Sulfamethoxazole-Trimethoprim    Review of Systems   Pertinent items are noted in the HPI. Otherwise, a complete ROS is negative.  Vitals   Vitals:   08/30/18 1432  BP: 130/68  Pulse: 82  Temp: 97.8 F (36.6 C)  TempSrc: Oral  SpO2: 98%  Weight: 133 lb 6.4 oz (60.5 kg)  Height: 5\' 3"  (1.6 m)     Body mass index is 23.63 kg/m.  Physical Exam   Physical Exam Vitals signs and nursing note reviewed.  HENT:     Head: Normocephalic and atraumatic.  Eyes:     Pupils: Pupils are equal, round, and reactive to light.  Neck:     Musculoskeletal: Normal range of motion and neck supple.  Cardiovascular:     Rate and Rhythm: Normal rate and regular rhythm.     Heart sounds: Normal heart sounds.  Pulmonary:     Effort: Pulmonary effort is normal.  Abdominal:     Palpations: Abdomen is soft.  Skin:    General: Skin is warm.  Psychiatric:        Behavior: Behavior normal.    Assessment and Plan   Kara Kennedy was seen today for follow-up.  Diagnoses and all orders for this visit:  Attention deficit hyperactivity disorder (ADHD), combined type Comments: Refills today.   Encounter for screening for HIV -     HIV Antibody (routine testing w rflx)  Encounter for hepatitis C virus screening test for high risk patient -     Hepatitis C antibody  Screening for colon cancer -     Ambulatory referral to Gastroenterology  Chronic midline low back pain without sciatica   . Orders and follow up as documented in [place], reviewed diet, exercise and weight control, cardiovascular risk and specific lipid/LDL goals reviewed, reviewed medications and side effects in detail.  . Reviewed expectations re: course of current medical issues. . Outlined signs and symptoms indicating need for more acute  intervention. . Patient verbalized understanding and all questions were answered. . Patient received an After Visit Summary.  CMA served as Education administrator during this visit. History, Physical, and Plan performed by medical provider. The above documentation has been reviewed and is accurate and complete. Kara Kennedy, D.O.  Kara Deutscher, DO [place] [place] 09/03/2018

## 2018-08-30 ENCOUNTER — Ambulatory Visit (INDEPENDENT_AMBULATORY_CARE_PROVIDER_SITE_OTHER): Payer: 59 | Admitting: Family Medicine

## 2018-08-30 VITALS — BP 130/68 | HR 82 | Temp 97.8°F | Ht 63.0 in | Wt 133.4 lb

## 2018-08-30 DIAGNOSIS — F902 Attention-deficit hyperactivity disorder, combined type: Secondary | ICD-10-CM

## 2018-08-30 DIAGNOSIS — Z9189 Other specified personal risk factors, not elsewhere classified: Secondary | ICD-10-CM | POA: Diagnosis not present

## 2018-08-30 DIAGNOSIS — Z1211 Encounter for screening for malignant neoplasm of colon: Secondary | ICD-10-CM | POA: Diagnosis not present

## 2018-08-30 DIAGNOSIS — G8929 Other chronic pain: Secondary | ICD-10-CM

## 2018-08-30 DIAGNOSIS — M545 Low back pain, unspecified: Secondary | ICD-10-CM

## 2018-08-30 DIAGNOSIS — Z114 Encounter for screening for human immunodeficiency virus [HIV]: Secondary | ICD-10-CM

## 2018-08-30 DIAGNOSIS — Z1159 Encounter for screening for other viral diseases: Secondary | ICD-10-CM

## 2018-08-31 LAB — HIV ANTIBODY (ROUTINE TESTING W REFLEX): HIV 1&2 Ab, 4th Generation: NONREACTIVE

## 2018-08-31 LAB — HEPATITIS C ANTIBODY
Hepatitis C Ab: NONREACTIVE
SIGNAL TO CUT-OFF: 0.01 (ref <1.00)

## 2018-09-01 ENCOUNTER — Encounter: Payer: Self-pay | Admitting: Family Medicine

## 2018-09-02 MED FILL — VYVANSE 30 MG CAPSULE: 30 | 30 days supply | Qty: 30 | Fill #0

## 2018-09-03 ENCOUNTER — Encounter: Payer: Self-pay | Admitting: Family Medicine

## 2018-09-03 MED FILL — SYNTHROID 88 MCG TABLET: 88 | 90 days supply | Qty: 90 | Fill #0

## 2018-09-06 DIAGNOSIS — H43812 Vitreous degeneration, left eye: Secondary | ICD-10-CM | POA: Diagnosis not present

## 2018-09-08 ENCOUNTER — Other Ambulatory Visit: Payer: Self-pay

## 2018-09-08 ENCOUNTER — Encounter: Payer: Self-pay | Admitting: Family Medicine

## 2018-09-08 MED ORDER — PREMARIN 0.625 MG PO TABS: 0.6250 mg | ORAL_TABLET | Freq: Every day | ORAL | 1 refills | Status: DC

## 2018-09-08 MED FILL — PREMARIN 0.625 MG TABLET: 0.625 | 90 days supply | Qty: 90 | Fill #0

## 2018-09-10 ENCOUNTER — Telehealth: Payer: Self-pay

## 2018-09-10 NOTE — Telephone Encounter (Signed)
ROI fax to [facility] for records

## 2018-09-13 NOTE — Telephone Encounter (Signed)
Rec'd from [facility] forwarded 25 pages to [facility] Provider Historical MD

## 2018-09-21 ENCOUNTER — Encounter: Payer: Self-pay | Admitting: Family Medicine

## 2018-09-21 ENCOUNTER — Telehealth: Payer: Self-pay | Admitting: Gastroenterology

## 2018-09-21 NOTE — Telephone Encounter (Signed)
Pt wants to have colon done at [facility] records were sent over from pt previous gi. Records will be placed on the DOD Dr.Nandigam.

## 2018-09-23 ENCOUNTER — Other Ambulatory Visit: Payer: Self-pay | Admitting: Family Medicine

## 2018-09-23 MED ORDER — AZELASTINE HCL 0.1 % NA SOLN: 2.0000 | Freq: Two times a day (BID) | NASAL | 12 refills | Status: DC

## 2018-09-23 MED FILL — AZELASTINE HCL 137 MCG SPRY: 0.1 | 30 days supply | Qty: 30 | Fill #0

## 2018-09-23 NOTE — Progress Notes (Signed)
Sent in [place].  Algis Greenhouse. Jerline Pain, MD 09/23/2018 1:05 PM

## 2018-09-27 ENCOUNTER — Other Ambulatory Visit: Payer: Self-pay | Admitting: Family Medicine

## 2018-09-27 DIAGNOSIS — M47812 Spondylosis without myelopathy or radiculopathy, cervical region: Secondary | ICD-10-CM

## 2018-09-27 DIAGNOSIS — H43812 Vitreous degeneration, left eye: Secondary | ICD-10-CM | POA: Diagnosis not present

## 2018-09-27 MED FILL — GABAPENTIN 300 MG CAPSULE: 300 | 30 days supply | Qty: 90 | Fill #1

## 2018-09-27 MED FILL — BACLOFEN 10 MG TABS: 10 | 30 days supply | Qty: 30 | Fill #0

## 2018-09-30 ENCOUNTER — Encounter: Payer: Self-pay | Admitting: Gastroenterology

## 2018-09-30 MED FILL — VYVANSE 30 MG CAPSULE: 30 | 30 days supply | Qty: 30 | Fill #0

## 2018-10-04 ENCOUNTER — Ambulatory Visit: Payer: Self-pay

## 2018-10-04 ENCOUNTER — Encounter: Payer: Self-pay | Admitting: Sports Medicine

## 2018-10-04 ENCOUNTER — Ambulatory Visit: Payer: 59 | Admitting: Sports Medicine

## 2018-10-04 VITALS — BP 100/78 | HR 68 | Ht 63.0 in | Wt 133.4 lb

## 2018-10-04 DIAGNOSIS — M25511 Pain in right shoulder: Secondary | ICD-10-CM

## 2018-10-04 DIAGNOSIS — M546 Pain in thoracic spine: Secondary | ICD-10-CM | POA: Diagnosis not present

## 2018-10-04 DIAGNOSIS — G8929 Other chronic pain: Secondary | ICD-10-CM | POA: Diagnosis not present

## 2018-10-04 NOTE — Progress Notes (Signed)
[name]. Candi Profit, [facility] [place]  HAZELINE CHARNLEY - 63 y.o. female MRN [id number]  Date of birth: December 31, 1955  Visit Date: October 04, 2018  PCP: [name], DO   Referred by: [name], DO  SUBJECTIVE:  Chief Complaint  Patient presents with  . Right Shoulder - Follow-up    XR R shoulder 10/26/2017. Corticosteroid injection 04/05/2018. Radiating to R elbow and wrist. Has tried Tylenol, Tramadol and Nitro Protocol in the past. Current treatments, Tylenol and Meloxicam.   . Middle Back - Follow-up    HPI: Patient is here with the above symptoms.  She is having worsening pain over the past 2 weeks and really significant worsening over the last 2 to 3 days.  She reports the lateral shoulder pain is 7-8 out of 10.  It is constant and sharp.  Occasionally she does get some radiation down to the fifth finger but this is minimal.  Otherwise seems to radiate more towards the right elbow and wrist.  Is worse worsening in the morning.  She has good relief with heat while on her back.  Using Tylenol meloxicam is only proved to be incomplete relief.  REVIEW OF SYSTEMS: She does report nighttime disturbances due to laying directly on her shoulder.   Denies fevers, chills, recent weight gain or weight loss.  No night sweats.  HISTORY:  Prior history reviewed and updated per electronic medical record.  Patient Active Problem List   Diagnosis Date Noted  . S/P left knee arthroscopy 06/14/2018  . Acute lateral meniscal tear 04/30/2018  . Acute pain of left knee 04/10/2018  . Cervical spondylosis 10/26/2017    Status post 2 level fusion at C5-6 and C6-7, she does have some adjacent level disease on x-rays obtained 10/26/2017   . Rotator cuff syndrome of right shoulder 08/25/2017  . Right sided facial pain 03/12/2017    High concern for early shingles with dermatomal distribution. No lesions. Will go ahead a nd start treatment. Red  flags reviewed. DDx includes trigeminal neural   . Vitamin D deficiency 03/09/2017  . Stress fracture of right fibula 02/02/2017  . Right ankle pain 01/02/2017    + for CPPD Stress fracture is present on MRI with surrounding soft tissue edema and mild thickening of the superior medial spring ligament with likely posterior tibialis insufficiency.   . IBS (irritable bowel syndrome) 12/29/2016  . Family history of colon cancer 12/29/2016  . Anxiety 03/06/2014  . Hypothyroidism 03/06/2014  . Post menopausal syndrome 03/06/2014    Overview:  Dr Gertie Fey GYN   . Chronic neck pain 09/06/2012  . Allergic rhinitis 07/30/2012  . History of pyelonephritis 07/30/2012  . Insomnia 08/19/2011   Social History   Occupational History  . Not on file  Tobacco Use  . Smoking status: Former Research scientist (life sciences)  . Smokeless tobacco: Never Used  Substance and Sexual Activity  . Alcohol use: Never    Alcohol/week: 7.0 standard drinks    Types: 7 Glasses of wine per week    Frequency: Never  . Drug use: No  . Sexual activity: Yes    Partners: Male    Birth control/protection: Surgical   Social History   Social History Narrative  . Not on file    OBJECTIVE:  VS:  HT:5\' 3"  (160 cm)   WT:133 lb 6.4 oz (60.5 kg)  BMI:23.64    BP:100/78  HR:68bpm  TEMP: ( )  RESP:98 %  PHYSICAL EXAM: Adult female. No acute distress.  Alert and appropriate. Right shoulder is overall well aligned without significant deformity.  She has positive Hawkins and Neer's testing.  Intrinsic rotator cuff strength testing with internal rotation, external rotation, empty can testing, speeds testing, O'Brien's testing strength is 5/5.  She has pain however with empty can testing and O'Brien's testing.   ASSESSMENT:  1. Chronic midline thoracic back pain   2. Right shoulder pain, unspecified chronicity     PROCEDURES:  PROCEDURE NOTE: [place] Guided: Injection: Rightshoulder  DESCRIPTION OF PROCEDURE:  The patient's clinical  condition is marked by substantial pain and/or significant functional disability. Other conservative therapy has not provided relief, is contraindicated, or not appropriate. There is a reasonable likelihood that injection will significantly improve the patient's pain and/or functional impairment.  After discussing the risks, benefits and expected outcomes of the injection and all questions were reviewed and answered, the patient wished to undergo the above named procedure. Verbal consent was obtained. The skin was then prepped in sterile fashion and the target structure was injected as below:  Single injection performed as below:  PREP: Alcohol and Ethel Chloride  APPROACH: posterior, single injection, 22g 1.5 in.  INJECTATE: 1 cc 0.5% Marcaine and 1 cc 40 mg/mL Kenalog  DRESSING: Band-Aid  Post procedural instructions including recommending icing and warning signs for infection were reviewed.  This procedure was well tolerated and there were no complications.       PLAN:  Pertinent additional documentation may be included in corresponding procedure notes, imaging studies, problem based documentation and patient instructions.  No problem-specific Assessment & Plan notes found for this encounter.   Patient does have some signs of impingement today on exam.  She also has significant paracervical and scapular tightness and muscle spasms.  She will benefit from formal physical therapy and likely dry needling to help with these issues.  Subacromial injection performed today.  Referral to physical therapy placed today  Activity modifications and the importance of avoiding exacerbating activities (limiting pain to no more than a 4 / 10 during or following activity) recommended and discussed.   Discussed red flag symptoms that warrant earlier emergent evaluation and patient voices understanding.  >50% of this 25 minute visit spent in direct patient counseling and/or coordination of care.  Discussion  was focused on education regarding the in discussing the pathoetiology and anticipated clinical course of the above condition.    No orders of the defined types were placed in this encounter.  Lab Orders  No laboratory test(s) ordered today    Imaging Orders     [place] MSK POCT ULTRASOUND  Referral Orders     Ambulatory referral to Physical Therapy   Return in about 6 weeks (around 11/15/2018).          [name], [facility] Physician

## 2018-10-04 NOTE — Patient Instructions (Addendum)

## 2018-10-07 ENCOUNTER — Encounter: Payer: Self-pay | Admitting: Sports Medicine

## 2018-10-07 NOTE — Procedures (Signed)
PROCEDURE NOTE:  Ultrasound Guided: Injection: Right shoulder Images were obtained and interpreted by myself, Teresa Coombs, DO  Images have been saved and stored to PACS system. Images obtained on: GE S7 Ultrasound machine    ULTRASOUND FINDINGS:  Biceps Tendon: Normal Pec Major Insertion: Normal Subscapularis Tendon: Normal Supraspinatus Tendon: Small interstitial change with tracking into the lateral aspect of the deltoid from the rotator cuff interval. Infraspinatus/Teres Minor Tendon: Normal AC Joint: Slight degenerative change JOINT: No significant GH spurring appreciated LABRUM: Not evaluated   DESCRIPTION OF PROCEDURE:  The patient's clinical condition is marked by substantial pain and/or significant functional disability. Other conservative therapy has not provided relief, is contraindicated, or not appropriate. There is a reasonable likelihood that injection will significantly improve the patient's pain and/or functional impairment.   After discussing the risks, benefits and expected outcomes of the injection and all questions were reviewed and answered, the patient wished to undergo the above named procedure.  Verbal consent was obtained.  The ultrasound was used to identify the target structure and adjacent neurovascular structures. The skin was then prepped in sterile fashion and the target structure was injected under direct visualization using sterile technique as below:  Single injection performed as below: PREP: Alcohol and Ethel Chloride APPROACH:lateral, single injection, 25g 1.5 in. INJECTATE: 2 cc 0.5% Marcaine and 2 cc 40mg /mL DepoMedrol ASPIRATE: None DRESSING: Band-Aid  Post procedural instructions including recommending icing and warning signs for infection were reviewed.    This procedure was well tolerated and there were no complications.   IMPRESSION: Succesful Ultrasound Guided: Injection

## 2018-10-17 ENCOUNTER — Telehealth: Payer: Self-pay | Admitting: *Deleted

## 2018-10-17 NOTE — Telephone Encounter (Signed)
LMOM

## 2018-10-17 NOTE — Telephone Encounter (Signed)
Covid-19 travel screening questions  Have you traveled in the last 14 days? no If yes where?  Do you now or have you had a fever in the last 14 days? no  Do you have any respiratory symptoms of shortness of breath or cough now or in the last 14 days? no  Do you have a medical history of Congestive Heart Failure? no  Do you have a medical history of lung disease? no  Do you have any family members or close contacts with diagnosed or suspected Covid-19? no       

## 2018-10-18 ENCOUNTER — Other Ambulatory Visit: Payer: Self-pay

## 2018-10-18 ENCOUNTER — Encounter: Payer: Self-pay | Admitting: Gastroenterology

## 2018-10-18 ENCOUNTER — Other Ambulatory Visit: Payer: Self-pay | Admitting: Family Medicine

## 2018-10-18 ENCOUNTER — Ambulatory Visit (AMBULATORY_SURGERY_CENTER): Payer: Self-pay

## 2018-10-18 VITALS — Temp 97.2°F | Ht 63.0 in | Wt 133.6 lb

## 2018-10-18 DIAGNOSIS — F902 Attention-deficit hyperactivity disorder, combined type: Secondary | ICD-10-CM

## 2018-10-18 DIAGNOSIS — Z860100 Personal history of colon polyps, unspecified: Secondary | ICD-10-CM

## 2018-10-18 MED ORDER — LISDEXAMFETAMINE DIMESYLATE 30 MG PO CAPS: ORAL_CAPSULE | ORAL | 0 refills | Status: DC

## 2018-10-18 MED ORDER — LISDEXAMFETAMINE DIMESYLATE 30 MG PO CAPS: 30.0000 mg | ORAL_CAPSULE | Freq: Every day | ORAL | 0 refills | Status: DC

## 2018-10-18 MED ORDER — NA SULFATE-K SULFATE-MG SULF 17.5-3.13-1.6 GM/177ML PO SOLN: 1.0000 | Freq: Once | ORAL | 0 refills | Status: AC

## 2018-10-18 MED FILL — BACLOFEN 10 MG TABS: 10 | 30 days supply | Qty: 30 | Fill #1

## 2018-10-18 MED FILL — LINZESS 290 MCG CAPSULE: 290 | 90 days supply | Qty: 90 | Fill #1

## 2018-10-18 NOTE — Progress Notes (Signed)
Denies allergies to eggs or soy products. Denies complication of anesthesia or sedation. Denies use of weight loss medication. Denies use of O2.   Emmi instructions declined.  

## 2018-10-18 NOTE — Telephone Encounter (Signed)
Last OV 08/30/2018 Last refill 07/30/2018 #30/2 Next OV 11/02/2018  Forwarding to Dr. Juleen China.

## 2018-10-20 ENCOUNTER — Other Ambulatory Visit: Payer: Self-pay

## 2018-10-25 ENCOUNTER — Telehealth: Payer: Self-pay | Admitting: *Deleted

## 2018-10-25 NOTE — Telephone Encounter (Signed)
Left message on voicemail to call office. Need to see if agreeable to Webex.

## 2018-10-26 ENCOUNTER — Other Ambulatory Visit: Payer: Self-pay | Admitting: Family Medicine

## 2018-10-26 MED FILL — GABAPENTIN 300 MG CAPSULE: 300 | 30 days supply | Qty: 90 | Fill #0

## 2018-10-27 ENCOUNTER — Telehealth: Payer: Self-pay | Admitting: *Deleted

## 2018-10-27 MED FILL — VYVANSE 30 MG CAPSULE: 30 | 30 days supply | Qty: 30 | Fill #0

## 2018-10-27 NOTE — Telephone Encounter (Signed)
LM on pts VM informing her that 4/6 procedure is cancelled d/t covid restrictions.  Advised her we would call back to reschedule.

## 2018-10-27 NOTE — Telephone Encounter (Signed)
Pt called back and was advised

## 2018-11-01 ENCOUNTER — Encounter: Payer: 59 | Admitting: Gastroenterology

## 2018-11-01 NOTE — Progress Notes (Signed)
Virtual Visit via Video   I connected with Kara Kennedy on 11/02/18 at  9:20 AM EDT by a video enabled telemedicine application and verified that I am speaking with the correct person using two identifiers. Location patient: Home Location provider: [facility], Office Persons participating in the virtual visit: ADEANA GRILLIOT, [name], DO [name], CMA acting as scribe for Dr. [name].   I discussed the limitations of evaluation and management by telemedicine and the availability of in person appointments. The patient expressed understanding and agreed to proceed.  Subjective:   HPI: Patient has been taking all medications for allergies. Has some improvement but still having some symptoms. Ears feel clogged, drainage, some cough.   Since the last visit has the patient had any: Patient is on Vyvanse 30mg . Feels like it is working good.  Appetite changes? No Unintentional weight loss? No Is medication working well ? Yes Does patient take drug holidays? No Difficulties falling to sleep or maintaining sleep? Yes occasionally. Started recently. About a month ago. Thinks it may be stress.   Any anxiety?  No Any cardiac issues (fainting or paliptations)? No Suicidal thoughts? No Changes in health since last visit? No New medications? No Any illicit substance abuse? No Has the patient taken his medication today? Yes   Reviewed all precautions and expectations with prevention of Covid-19. She will call if she needs letter for work.   ROS: See pertinent positives and negatives per HPI.  Patient Active Problem List   Diagnosis Date Noted  . Seasonal allergies 11/02/2018  . Attention deficit hyperactivity disorder (ADHD), combined type 11/02/2018  . S/P left knee arthroscopy 06/14/2018  . Acute lateral meniscal tear 04/30/2018  . Acute pain of left knee 04/10/2018  . Cervical spondylosis 10/26/2017  . Rotator cuff syndrome of right shoulder 08/25/2017  . Right  sided facial pain 03/12/2017  . Vitamin D deficiency 03/09/2017  . Stress fracture of right fibula 02/02/2017  . Right ankle pain 01/02/2017  . IBS (irritable bowel syndrome) 12/29/2016  . Family history of colon cancer 12/29/2016  . Anxiety 03/06/2014  . Hypothyroidism 03/06/2014  . Post menopausal syndrome 03/06/2014  . Chronic neck pain 09/06/2012  . Allergic rhinitis 07/30/2012  . History of pyelonephritis 07/30/2012  . Insomnia 08/19/2011    Social History   Tobacco Use  . Smoking status: Former Research scientist (life sciences)  . Smokeless tobacco: Never Used  . Tobacco comment: Quit 1999  Substance Use Topics  . Alcohol use: Yes    Alcohol/week: 7.0 standard drinks    Types: 7 Glasses of wine per week    Frequency: Never    Comment: rare   Current Outpatient Medications:  .  azelastine (ASTELIN) 0.1 % nasal spray, Place 2 sprays into both nostrils 2 (two) times daily. Use in each nostril as directed, Disp: 30 mL, Rfl: 12 .  baclofen (LIORESAL) 10 MG tablet, TAKE 1 TABLET BY MOUTH AT BEDTIME AS NEEDED FOR MUSCLE SPASMS, Disp: 30 tablet, Rfl: 2 .  cholecalciferol (VITAMIN D) 1000 units tablet, Take 1,000 Units by mouth daily., Disp: , Rfl:  .  docusate sodium (COLACE) 100 MG capsule, Take 100 mg by mouth daily. , Disp: , Rfl:  .  gabapentin (NEURONTIN) 300 MG capsule, TAKE 3 CAPSULES BY MOUTH AT BEDTIME., Disp: 90 capsule, Rfl: 1 .  levothyroxine (SYNTHROID) 88 MCG tablet, Take 1 tablet (88 mcg total) by mouth every morning., Disp: 90 tablet, Rfl: 0 .  LINZESS 290 MCG CAPS capsule,  TAKE 1 CAPSULE BY MOUTH ONCE DAILY BEFORE BREAKFAST, Disp: 90 capsule, Rfl: 3 .  lisdexamfetamine (VYVANSE) 30 MG capsule, Take 1 capsule orally once daily before breakfast., Disp: 30 capsule, Rfl: 0 .  [START ON 11/17/2018] lisdexamfetamine (VYVANSE) 30 MG capsule, Take 1 capsule by mouth daily before breakfast, Disp: 30 capsule, Rfl: 0 .  [START ON 12/17/2018] lisdexamfetamine (VYVANSE) 30 MG capsule, Take 1 capsule (30 mg  total) by mouth daily., Disp: 30 capsule, Rfl: 0 .  meloxicam (MOBIC) 15 MG tablet, Take 1 tablet (15 mg total) by mouth daily., Disp: 30 tablet, Rfl: 0 .  PREMARIN 0.625 MG tablet, Take 1 tablet (0.625 mg total) by mouth daily., Disp: 90 tablet, Rfl: 1  Allergies  Allergen Reactions  . Depo-Medrol [Methylprednisolone Acetate] Swelling    After knee injection  . Sulfamethoxazole-Trimethoprim     Objective:   VITALS: Per patient if applicable, see vitals. GENERAL: Alert, appears well and in no acute distress. HEENT: Atraumatic, conjunctiva clear, no obvious abnormalities on inspection of external nose and ears. NECK: Normal movements of the head and neck. CARDIOPULMONARY: No increased WOB. Speaking in clear sentences. I:E ratio WNL.  MS: Moves all visible extremities without noticeable abnormality. PSYCH: Pleasant and cooperative, well-groomed. Speech normal rate and rhythm. Affect is appropriate. Insight and judgement are appropriate. Attention is focused, linear, and appropriate.  NEURO: CN grossly intact. Oriented as arrived to appointment on time with no prompting. Moves both UE equally.  SKIN: No obvious lesions, wounds, erythema, or cyanosis noted on face or hands.  Assessment and Plan:   Sabirin was seen today for follow-up.  Diagnoses and all orders for this visit:  Attention deficit hyperactivity disorder (ADHD), combined type -     lisdexamfetamine (VYVANSE) 30 MG capsule; Take 1 capsule (30 mg total) by mouth daily.  Adjustment insomnia -     traZODone (DESYREL) 50 MG tablet; Take 0.5-1 tablets (25-50 mg total) by mouth at bedtime as needed for sleep.  Anxiety  Seasonal allergies -     montelukast (SINGULAIR) 10 MG tablet; Take 1 tablet (10 mg total) by mouth at bedtime.   . Reviewed expectations re: course of current medical issues. . Discussed self-management of symptoms. . Outlined signs and symptoms indicating need for more acute intervention. . Patient  verbalized understanding and all questions were answered. Marland Kitchen Health Maintenance issues including appropriate healthy diet, exercise, and smoking avoidance were discussed with patient. . See orders for this visit as documented in the electronic medical record.  [name], DO 11/02/2018

## 2018-11-02 ENCOUNTER — Encounter: Payer: Self-pay | Admitting: Family Medicine

## 2018-11-02 ENCOUNTER — Ambulatory Visit (INDEPENDENT_AMBULATORY_CARE_PROVIDER_SITE_OTHER): Payer: 59 | Admitting: Family Medicine

## 2018-11-02 ENCOUNTER — Other Ambulatory Visit: Payer: Self-pay

## 2018-11-02 VITALS — Temp 97.6°F | Ht 63.0 in | Wt 132.0 lb

## 2018-11-02 DIAGNOSIS — F902 Attention-deficit hyperactivity disorder, combined type: Secondary | ICD-10-CM

## 2018-11-02 DIAGNOSIS — J302 Other seasonal allergic rhinitis: Secondary | ICD-10-CM | POA: Insufficient documentation

## 2018-11-02 DIAGNOSIS — F5102 Adjustment insomnia: Secondary | ICD-10-CM

## 2018-11-02 DIAGNOSIS — F419 Anxiety disorder, unspecified: Secondary | ICD-10-CM

## 2018-11-02 MED ORDER — MONTELUKAST SODIUM 10 MG PO TABS: 10.0000 mg | ORAL_TABLET | Freq: Every day | ORAL | 1 refills | Status: DC

## 2018-11-02 MED ORDER — LISDEXAMFETAMINE DIMESYLATE 30 MG PO CAPS: 30.0000 mg | ORAL_CAPSULE | Freq: Every day | ORAL | 0 refills | Status: DC

## 2018-11-02 MED ORDER — TRAZODONE HCL 50 MG PO TABS: 25.0000 mg | ORAL_TABLET | Freq: Every evening | ORAL | 1 refills | Status: DC | PRN

## 2018-11-02 MED FILL — MONTELUKAST SOD 10 MG TAB: 10 | 90 days supply | Qty: 90 | Fill #0

## 2018-11-02 MED FILL — traZODone HCL 50 MG TABS: 50 | 90 days supply | Qty: 90 | Fill #0

## 2018-11-10 ENCOUNTER — Encounter: Payer: Self-pay | Admitting: Family Medicine

## 2018-11-11 MED ORDER — METHYLPREDNISOLONE 4 MG PO TBPK: ORAL_TABLET | ORAL | 0 refills | Status: DC

## 2018-11-11 MED FILL — METHYLPREDNISOLONE 4 MG TBP: 4 | 21 days supply | Qty: 21 | Fill #0

## 2018-11-15 ENCOUNTER — Encounter: Payer: Self-pay | Admitting: Sports Medicine

## 2018-11-15 ENCOUNTER — Ambulatory Visit: Payer: 59 | Admitting: Sports Medicine

## 2018-11-24 ENCOUNTER — Other Ambulatory Visit: Payer: Self-pay | Admitting: Family Medicine

## 2018-11-24 MED FILL — GABAPENTIN 300 MG CAPSULE: 300 | 30 days supply | Qty: 90 | Fill #1

## 2018-11-24 MED FILL — VYVANSE 30 MG CAPSULE: 30 | 90 days supply | Qty: 90 | Fill #0

## 2018-11-25 MED FILL — SYNTHROID 88 MCG TABLET: 88 | 90 days supply | Qty: 90 | Fill #0

## 2018-11-28 NOTE — Progress Notes (Signed)
[facility] [place], [place] [address] Phone: [phone] Subjective:      CC: Patient had x-rays at last exam neck pain and shoulder pain follow-up  CWC:[id number]  Kara Kennedy is a 63 y.o. female coming in with complaint of right arm pain.  Patient has had this for quite some time.  Patient saw another provider last time in office October 04, 2018.  Was given injection in the right shoulder at that time.  Patient was to start physical therapy but secondary to coronavirus outbreak unable to do so.  Patient also had radiating pain to the right elbow and wrist.  Patient states that she does not feel any better. Was prescribed medication that made her feel better for about a week.  Patient's x-rays of the cervical spine taken back in April 2019.  Patient has a 2 level fusion at C5-C7 with no significant loosening. Patient did have right shoulder x-rays done at the same time that showed moderate arthritic changes.     Past Medical History:  Diagnosis Date  . Allergic rhinitis 07/30/2012  . Allergy   . Anxiety 03/06/2014  . Chronic neck pain 09/06/2012  . Endometriosis   . Family history of colon cancer 12/29/2016  . History of pyelonephritis 07/30/2012  . Hypothyroidism 03/06/2014  . IBS (irritable bowel syndrome) 12/29/2016  . Insomnia 08/19/2011  . Pneumonia    hx of 2009   . PONV (postoperative nausea and vomiting)   . Post menopausal syndrome 03/06/2014   Past Surgical History:  Procedure Laterality Date  . ABDOMINAL HYSTERECTOMY    . cervical fusion C5-C7     . CESAREAN SECTION    . DILATION AND CURETTAGE OF UTERUS    . GALLBLADDER SURGERY    . KNEE ARTHROSCOPY WITH MEDIAL MENISECTOMY Left 04/30/2018   Procedure: LEFT KNEE ARTHROSCOPY WITH DEBRIDEMENT;  Surgeon: Mcarthur Rossetti, MD;  Location: [facility];  Service: Orthopedics;  Laterality: Left;  . SPINE SURGERY     Social History   Socioeconomic History  . Marital status:  Married    Spouse name: Not on file  . Number of children: Not on file  . Years of education: Not on file  . Highest education level: Not on file  Occupational History  . Not on file  Social Needs  . Financial resource strain: Not on file  . Food insecurity:    Worry: Not on file    Inability: Not on file  . Transportation needs:    Medical: Not on file    Non-medical: Not on file  Tobacco Use  . Smoking status: Former Research scientist (life sciences)  . Smokeless tobacco: Never Used  . Tobacco comment: Quit 1999  Substance and Sexual Activity  . Alcohol use: Yes    Alcohol/week: 7.0 standard drinks    Types: 7 Glasses of wine per week    Frequency: Never    Comment: rare  . Drug use: No  . Sexual activity: Yes    Partners: Male    Birth control/protection: Surgical  Lifestyle  . Physical activity:    Days per week: Not on file    Minutes per session: Not on file  . Stress: Not on file  Relationships  . Social connections:    Talks on phone: Not on file    Gets together: Not on file    Attends religious service: Not on file    Active member of club or organization: Not on file  Attends meetings of clubs or organizations: Not on file    Relationship status: Not on file  Other Topics Concern  . Not on file  Social History Narrative  . Not on file   Allergies  Allergen Reactions  . Depo-Medrol [Methylprednisolone Acetate] Swelling    After knee injection  . Sulfamethoxazole-Trimethoprim    Family History  Problem Relation Age of Onset  . Cancer Mother   . Colon cancer Mother   . Diabetes Father   . Heart disease Father   . Cancer Maternal Grandmother   . Diabetes Paternal Grandfather   . Esophageal cancer Neg Hx   . Rectal cancer Neg Hx   . Stomach cancer Neg Hx     Current Outpatient Medications (Endocrine & Metabolic):  .  methylPREDNISolone (MEDROL DOSEPAK) 4 MG TBPK tablet, Take by mouth as directed. Take 6 tablets on the first day prescribed then as directed. Marland Kitchen  PREMARIN  0.625 MG tablet, Take 1 tablet (0.625 mg total) by mouth daily. Marland Kitchen  SYNTHROID 88 MCG tablet, TAKE 1 TABLET BY MOUTH EVERY MORNING   Current Outpatient Medications (Respiratory):  .  azelastine (ASTELIN) 0.1 % nasal spray, Place 2 sprays into both nostrils 2 (two) times daily. Use in each nostril as directed .  fluticasone (FLONASE) 50 MCG/ACT nasal spray, Place into both nostrils daily. Marland Kitchen  loratadine (CLARITIN) 10 MG tablet, Take 10 mg by mouth daily. .  montelukast (SINGULAIR) 10 MG tablet, Take 1 tablet (10 mg total) by mouth at bedtime.    Current Outpatient Medications (Other):  .  baclofen (LIORESAL) 10 MG tablet, TAKE 1 TABLET BY MOUTH AT BEDTIME AS NEEDED FOR MUSCLE SPASMS .  cholecalciferol (VITAMIN D) 1000 units tablet, Take 1,000 Units by mouth daily. Marland Kitchen  docusate sodium (COLACE) 100 MG capsule, Take 100 mg by mouth daily.  Marland Kitchen  gabapentin (NEURONTIN) 300 MG capsule, TAKE 3 CAPSULES BY MOUTH AT BEDTIME. Marland Kitchen  LINZESS 290 MCG CAPS capsule, TAKE 1 CAPSULE BY MOUTH ONCE DAILY BEFORE BREAKFAST .  lisdexamfetamine (VYVANSE) 30 MG capsule, Take 1 capsule (30 mg total) by mouth daily. .  traZODone (DESYREL) 50 MG tablet, Take 0.5-1 tablets (25-50 mg total) by mouth at bedtime as needed for sleep. Marland Kitchen  lisdexamfetamine (VYVANSE) 30 MG capsule, Take 1 capsule orally once daily before breakfast. .  venlafaxine XR (EFFEXOR XR) 37.5 MG 24 hr capsule, Take 1 capsule (37.5 mg total) by mouth daily with breakfast.    Past medical history, social, surgical and family history all reviewed in electronic medical record.  No pertanent information unless stated regarding to the chief complaint.   Review of Systems:  No headache, visual changes, nausea, vomiting, diarrhea, constipation, dizziness, abdominal pain, skin rash, fevers, chills, night sweats, weight loss, swollen lymph nodes, , joint swelling,  chest pain, shortness of breath, mood changes.  Positive muscle aches and body aches  Objective  Blood  pressure 116/70, pulse (!) 53, height 5\' 3"  (1.6 m), weight 138 lb (62.6 kg), SpO2 98 %.    General: No apparent distress alert and oriented x3 mood and affect normal, dressed appropriately.  HEENT: Pupils equal, extraocular movements intact  Respiratory: Patient's speak in full sentences and does not appear short of breath  Cardiovascular: No lower extremity edema, non tender, no erythema  Skin: Warm dry intact with no signs of infection or rash on extremities or on axial skeleton.  Abdomen: Soft nontender  Neuro: Cranial nerves II through XII are intact, neurovascularly intact in all  extremities with 2+ DTRs and 2+ pulses.  Lymph: No lymphadenopathy of posterior or anterior cervical chain or axillae bilaterally.  Gait normal with good balance and coordination.  MSK:  Non tender with full range of motion and good stability and symmetric strength and tone of  elbows, wrist, hip, knee and ankles bilaterally.  Neck: Inspection loss of lordosis. No palpable stepoffs. Negative Spurling's maneuver. Patient does have -10 degrees of extension and lacks the last 5 degrees of flexion Grip strength and sensation normal in bilateral hands Strength good C4 to T1 distribution No sensory change to C4 to T1 Negative Hoffman sign bilaterally Reflexes normal  tightness of the trapezius bilaterally but does have atrophy of the right side  Shoulder: Right Inspection atrophy noted Palpation is normal with no tenderness over AC joint or bicipital groove. ROM is full in all planes. 4+/5 strength Positive impingement Speeds and Yergason's tests normal. No labral pathology noted with negative Obrien's, negative clunk and good stability.  Normal scapular function observed. No painful arc and no drop arm sign. No apprehension sign Contralateral shoulder unremarkable  [id number]; 15 additional minutes spent for Therapeutic exercises as stated in above notes.  This included exercises focusing on stretching,  strengthening, with significant focus on eccentric aspects.   Long term goals include an improvement in range of motion, strength, endurance as well as avoiding reinjury. Patient's frequency would include in 1-2 times a day, 3-5 times a week for a duration of 6-12 weeks. Shoulder Exercises that included:  Basic scapular stabilization to include adduction and depression of scapula Scaption, focusing on proper movement and good control Internal and External rotation utilizing a theraband, with elbow tucked at side entire time Rows with theraband  .shoulder Proper technique shown and discussed handout in great detail with ATC.  All questions were discussed and answered.      Impression and Recommendations:     This case required medical decision making of moderate complexity. The above documentation has been reviewed and is accurate and complete Kara Pulley, DO       Note: This dictation was prepared with Dragon dictation along with smaller phrase technology. Any transcriptional errors that result from this process are unintentional.

## 2018-11-29 ENCOUNTER — Other Ambulatory Visit: Payer: Self-pay

## 2018-11-29 ENCOUNTER — Ambulatory Visit: Payer: 59 | Admitting: Family Medicine

## 2018-11-29 ENCOUNTER — Encounter: Payer: Self-pay | Admitting: Family Medicine

## 2018-11-29 DIAGNOSIS — M47812 Spondylosis without myelopathy or radiculopathy, cervical region: Secondary | ICD-10-CM | POA: Diagnosis not present

## 2018-11-29 DIAGNOSIS — M19011 Primary osteoarthritis, right shoulder: Secondary | ICD-10-CM | POA: Diagnosis not present

## 2018-11-29 MED ORDER — VENLAFAXINE HCL ER 37.5 MG PO CP24: 37.5000 mg | ORAL_CAPSULE | Freq: Every day | ORAL | 1 refills | Status: DC

## 2018-11-29 MED FILL — VENLAFAXINE HCL ER 37.5 MG: 37.5 | 30 days supply | Qty: 30 | Fill #0

## 2018-11-29 MED FILL — BACLOFEN 10 MG TABS: 10 | 30 days supply | Qty: 30 | Fill #2

## 2018-11-29 NOTE — Assessment & Plan Note (Signed)
History of a 2 level fusion.  Patient does have significant pain.  I believe that this is contributing to some of the pain.  Patient's weakness of the shoulder girdle could be secondary to adjacent segment disease.  We discussed Effexor, encourage patient to take the gabapentin just at night.  Patient has been to try these.  Has trazodone for nighttime pain as well.  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  Follow-up again in 4 to 8 weeks

## 2018-11-29 NOTE — Assessment & Plan Note (Signed)
Arthritis of the shoulder.  Discussed with patient about home exercise and icing regimen.  We discussed which activities of doing which will still avoid.  Patient will hold on any type of injection at this time but may need it in the long run.  Patient is agreement with the plan will follow-up again in 4 to 6 weeks

## 2018-11-29 NOTE — Patient Instructions (Addendum)
Very nice to meet you  Ice 20 minutes 2 times daily. Usually after activity and before bed. Exercises 3 times a week.  Kepe hands within peripheral vision  Stop gabapentin and start 2 pills of horizant at night, after 3 days if better then call me and I will send in prescription  Effexor 37.5 mg daily  Over the counter get  Tylenol 3 times daily  Vitamin D 2000IU daily  Turmeric 500mg  daily  Tart cherry extract any dose at night  See me again in 4 weeks

## 2018-11-30 ENCOUNTER — Ambulatory Visit: Payer: 59 | Admitting: Sports Medicine

## 2018-12-01 ENCOUNTER — Telehealth: Payer: Self-pay | Admitting: *Deleted

## 2018-12-01 NOTE — Telephone Encounter (Signed)
Called patient to reschedule previously cancelled colonoscopy due to Covid-19. Left message for patient to call to reschedule, verify rx and redo instructions.

## 2018-12-02 MED FILL — PREMARIN 0.625 MG TABLET: 0.625 | 90 days supply | Qty: 90 | Fill #1

## 2018-12-06 ENCOUNTER — Telehealth: Payer: Self-pay | Admitting: Family Medicine

## 2018-12-06 NOTE — Telephone Encounter (Signed)
Copied from [place] [id number]. Topic: Quick Communication - Rx Refill/Question >> Dec 06, 2018 10:57 AM Rayann Heman wrote: Medication: pt called an stated that the Horizant samples that Dr Tamala Julian gave her  worked well and would like an RX sent over.  [place], [place] - [place] [phone] (Phone) [phone] (Fax)

## 2018-12-07 ENCOUNTER — Ambulatory Visit: Payer: 59 | Admitting: Family Medicine

## 2018-12-07 ENCOUNTER — Other Ambulatory Visit: Payer: Self-pay

## 2018-12-07 ENCOUNTER — Encounter: Payer: Self-pay | Admitting: Family Medicine

## 2018-12-07 VITALS — BP 124/82 | HR 72 | Temp 98.7°F | Ht 64.0 in | Wt 136.6 lb

## 2018-12-07 DIAGNOSIS — K5792 Diverticulitis of intestine, part unspecified, without perforation or abscess without bleeding: Secondary | ICD-10-CM

## 2018-12-07 DIAGNOSIS — R1030 Lower abdominal pain, unspecified: Secondary | ICD-10-CM | POA: Diagnosis not present

## 2018-12-07 LAB — POC URINALSYSI DIPSTICK (AUTOMATED)
Bilirubin, UA: 1
Blood, UA: NEGATIVE
Glucose, UA: NEGATIVE
Ketones, UA: NEGATIVE
Leukocytes, UA: NEGATIVE
Nitrite, UA: POSITIVE
Protein, UA: NEGATIVE
Spec Grav, UA: 1.02 (ref 1.010–1.025)
Urobilinogen, UA: 0.2 E.U./dL
pH, UA: 6 (ref 5.0–8.0)

## 2018-12-07 MED ORDER — HYDROCODONE-ACETAMINOPHEN 5-325 MG PO TABS: 1.0000 | ORAL_TABLET | Freq: Four times a day (QID) | ORAL | 0 refills | Status: DC | PRN

## 2018-12-07 MED ORDER — METRONIDAZOLE 500 MG PO TABS: 500.0000 mg | ORAL_TABLET | Freq: Three times a day (TID) | ORAL | 0 refills | Status: DC

## 2018-12-07 MED ORDER — CIPROFLOXACIN HCL 500 MG PO TABS: 500.0000 mg | ORAL_TABLET | Freq: Two times a day (BID) | ORAL | 0 refills | Status: DC

## 2018-12-07 MED FILL — HYDROCODON-APAP 5-325: 5-325 | 7 days supply | Qty: 30 | Fill #0

## 2018-12-07 MED FILL — METRONIDAZOLE 500 MG TABS: 500 | 10 days supply | Qty: 30 | Fill #0

## 2018-12-07 MED FILL — CIPROFLOXACIN HCL 500 MG TA: 500 | 10 days supply | Qty: 20 | Fill #0

## 2018-12-07 NOTE — Progress Notes (Signed)
Kara Kennedy is a 63 y.o. female here for an acute visit.  History of Present Illness:   Kara Kennedy, CMA acting as scribe for Dr. Briscoe Kennedy.   HPI: Patient is having lower abdominal pain for 4 days. No changes in bowel movements. She has had some constipation. No changes in color, consistency and odor are the same. Pain level today when sitting is a 3 with movement can get to 8. Pain is sharp and constant. She has history of c section and hysterectomy. Pain is not in back at all. She is not having any urinary symptoms but did take an Azo to see if it helped. Eating increases pain. Tender to touch greater on right side.   PMHx, SurgHx, SocialHx, Medications, and Allergies were reviewed in the Visit Navigator and updated as appropriate.  Current Medications   Current Outpatient Medications:  .  azelastine (ASTELIN) 0.1 % nasal spray, Place 2 sprays into both nostrils 2 (two) times daily. Use in each nostril as directed, Disp: 30 mL, Rfl: 12 .  baclofen (LIORESAL) 10 MG tablet, TAKE 1 TABLET BY MOUTH AT BEDTIME AS NEEDED FOR MUSCLE SPASMS, Disp: 30 tablet, Rfl: 2 .  cholecalciferol (VITAMIN D) 1000 units tablet, Take 1,000 Units by mouth daily., Disp: , Rfl:  .  docusate sodium (COLACE) 100 MG capsule, Take 100 mg by mouth daily. , Disp: , Rfl:  .  fluticasone (FLONASE) 50 MCG/ACT nasal spray, Place into both nostrils daily., Disp: , Rfl:  .  gabapentin (NEURONTIN) 300 MG capsule, TAKE 3 CAPSULES BY MOUTH AT BEDTIME., Disp: 90 capsule, Rfl: 1 .  LINZESS 290 MCG CAPS capsule, TAKE 1 CAPSULE BY MOUTH ONCE DAILY BEFORE BREAKFAST, Disp: 90 capsule, Rfl: 3 .  lisdexamfetamine (VYVANSE) 30 MG capsule, Take 1 capsule orally once daily before breakfast., Disp: 30 capsule, Rfl: 0 .  lisdexamfetamine (VYVANSE) 30 MG capsule, Take 1 capsule (30 mg total) by mouth daily., Disp: 90 capsule, Rfl: 0 .  loratadine (CLARITIN) 10 MG tablet, Take 10 mg by mouth daily., Disp: , Rfl:  .   methylPREDNISolone (MEDROL DOSEPAK) 4 MG TBPK tablet, Take by mouth as directed. Take 6 tablets on the first day prescribed then as directed., Disp: 21 tablet, Rfl: 0 .  montelukast (SINGULAIR) 10 MG tablet, Take 1 tablet (10 mg total) by mouth at bedtime., Disp: 90 tablet, Rfl: 1 .  PREMARIN 0.625 MG tablet, Take 1 tablet (0.625 mg total) by mouth daily., Disp: 90 tablet, Rfl: 1 .  SYNTHROID 88 MCG tablet, TAKE 1 TABLET BY MOUTH EVERY MORNING, Disp: 90 tablet, Rfl: 0 .  traZODone (DESYREL) 50 MG tablet, Take 0.5-1 tablets (25-50 mg total) by mouth at bedtime as needed for sleep., Disp: 90 tablet, Rfl: 1 .  venlafaxine XR (EFFEXOR XR) 37.5 MG 24 hr capsule, Take 1 capsule (37.5 mg total) by mouth daily with breakfast., Disp: 30 capsule, Rfl: 1   Allergies  Allergen Reactions  . Depo-Medrol [Methylprednisolone Acetate] Swelling    After knee injection  . Sulfamethoxazole-Trimethoprim    Review of Systems   Pertinent items are noted in the HPI. Otherwise, ROS is negative.  Vitals   Vitals:   12/07/18 1401  BP: 124/82  Pulse: 72  Temp: 98.7 F (37.1 C)  TempSrc: Oral  SpO2: 99%  Weight: 136 lb 9.6 oz (62 kg)  Height: 5\' 4"  (1.626 m)     Body mass index is 23.45 kg/m.  Physical Exam   Physical Exam Vitals signs  and nursing note reviewed.  HENT:     Head: Normocephalic and atraumatic.  Eyes:     Pupils: Pupils are equal, round, and reactive to light.  Neck:     Musculoskeletal: Normal range of motion and neck supple.  Cardiovascular:     Rate and Rhythm: Normal rate and regular rhythm.     Heart sounds: Normal heart sounds.  Pulmonary:     Effort: Pulmonary effort is normal.  Abdominal:     Palpations: Abdomen is soft.     Tenderness: There is generalized abdominal tenderness and tenderness in the right lower quadrant and left lower quadrant.  Skin:    General: Skin is warm.  Psychiatric:        Behavior: Behavior normal.      Results for orders placed or  performed in visit on 12/07/18  POCT Urinalysis Dipstick (Automated)  Result Value Ref Range   Color, UA     Clarity, UA     Glucose, UA Negative Negative   Bilirubin, UA 1    Ketones, UA neg    Spec Grav, UA 1.020 1.010 - 1.025   Blood, UA neg    pH, UA 6.0 5.0 - 8.0   Protein, UA Negative Negative   Urobilinogen, UA 0.2 0.2 or 1.0 E.U./dL   Nitrite, UA pos    Leukocytes, UA Negative Negative   Assessment and Plan   Kara Kennedy was seen today for abdominal pain.  Diagnoses and all orders for this visit:  Lower abdominal pain -     POCT Urinalysis Dipstick (Automated)  Diverticulitis -     ciprofloxacin (CIPRO) 500 MG tablet; Take 1 tablet (500 mg total) by mouth 2 (two) times daily. -     metroNIDAZOLE (FLAGYL) 500 MG tablet; Take 1 tablet (500 mg total) by mouth 3 (three) times daily. -     HYDROcodone-acetaminophen (NORCO/VICODIN) 5-325 MG tablet; Take 1 tablet by mouth every 6 (six) hours as needed for moderate pain.    . Reviewed expectations re: course of current medical issues. . Discussed self-management of symptoms. . Outlined signs and symptoms indicating need for more acute intervention. . Patient verbalized understanding and all questions were answered. Marland Kitchen Health Maintenance issues including appropriate healthy diet, exercise, and smoking avoidance were discussed with patient. . See orders for this visit as documented in the electronic medical record. . Patient received an After Visit Summary.  CMA served as Education administrator during this visit. History, Physical, and Plan performed by medical provider. The above documentation has been reviewed and is accurate and complete. Kara Kennedy, D.O.  Kara Deutscher, DO [place] 12/07/2018

## 2018-12-08 NOTE — Telephone Encounter (Signed)
Noted that pt rescheduled procedure for 01-04-19 at 830.  New prep instructions (2 day prep) mailed to pt.

## 2018-12-14 ENCOUNTER — Telehealth: Payer: Self-pay | Admitting: Gastroenterology

## 2018-12-14 NOTE — Telephone Encounter (Signed)
Notified patient, completed new instructions and sent via My Chart.

## 2018-12-14 NOTE — Telephone Encounter (Signed)
Patient is scheduled for a procedure on 01-04-19 and said she is having a diverticulitis flare up and wants to know if its still okay to have the procedure.

## 2018-12-14 NOTE — Telephone Encounter (Signed)
We should reschedule it in 4-6 weeks. Thanks

## 2018-12-27 ENCOUNTER — Encounter: Payer: Self-pay | Admitting: Family Medicine

## 2018-12-27 ENCOUNTER — Ambulatory Visit (INDEPENDENT_AMBULATORY_CARE_PROVIDER_SITE_OTHER): Payer: 59 | Admitting: Family Medicine

## 2018-12-27 ENCOUNTER — Other Ambulatory Visit: Payer: Self-pay

## 2018-12-27 DIAGNOSIS — M19011 Primary osteoarthritis, right shoulder: Secondary | ICD-10-CM | POA: Diagnosis not present

## 2018-12-27 MED ORDER — GABAPENTIN ENACARBIL ER 600 MG PO TBCR: 600.0000 mg | EXTENDED_RELEASE_TABLET | Freq: Every day | ORAL | 3 refills | Status: DC

## 2018-12-27 NOTE — Assessment & Plan Note (Signed)
Patient given injection today.  Does have history of spondylosis that could be contributing as well.  We discussed posture and ergonomics.  Discussed which activities of doing which wants to avoid.  Patient is to increase activity slowly.  We discussed with patient that this may get worse.  Other possibilities include PRP.  Patient will consider this.  Follow-up again 6 weeks

## 2018-12-27 NOTE — Patient Instructions (Signed)
Follow-up in 6 weeks

## 2018-12-27 NOTE — Progress Notes (Signed)
[facility] [address] [place], [place] [address] Phone: [phone] Subjective:   I Kara Kennedy am serving as a Education administrator for Dr. Hulan Saas.   CC: Right shoulder pain  [id number]  Kara Kennedy is a 63 y.o. female coming in with complaint of right shoulder pain. States her pain has been better compared to the last 2 days.  Patient has known arthritic changes moderate in severity of the right shoulder.  Patient has had history of neck surgery previously and does have some radicular symptoms.  Patient says it seems to be more in the shoulder.  Waking her up at night, affecting daily activities.  Patient rates the severity of pain is 9 out of 10.       Past Medical History:  Diagnosis Date  . Allergic rhinitis 07/30/2012  . Allergy   . Anxiety 03/06/2014  . Chronic neck pain 09/06/2012  . Endometriosis   . Family history of colon cancer 12/29/2016  . History of pyelonephritis 07/30/2012  . Hypothyroidism 03/06/2014  . IBS (irritable bowel syndrome) 12/29/2016  . Insomnia 08/19/2011  . Pneumonia    hx of 2009   . PONV (postoperative nausea and vomiting)   . Post menopausal syndrome 03/06/2014   Past Surgical History:  Procedure Laterality Date  . ABDOMINAL HYSTERECTOMY    . cervical fusion C5-C7     . CESAREAN SECTION    . DILATION AND CURETTAGE OF UTERUS    . GALLBLADDER SURGERY    . KNEE ARTHROSCOPY WITH MEDIAL MENISECTOMY Left 04/30/2018   Procedure: LEFT KNEE ARTHROSCOPY WITH DEBRIDEMENT;  Surgeon: Mcarthur Rossetti, MD;  Location: [facility];  Service: Orthopedics;  Laterality: Left;  . SPINE SURGERY     Social History   Socioeconomic History  . Marital status: Married    Spouse name: Not on file  . Number of children: Not on file  . Years of education: Not on file  . Highest education level: Not on file  Occupational History  . Not on file  Social Needs  . Financial resource strain: Not on file  . Food insecurity:    Worry: Not  on file    Inability: Not on file  . Transportation needs:    Medical: Not on file    Non-medical: Not on file  Tobacco Use  . Smoking status: Former Research scientist (life sciences)  . Smokeless tobacco: Never Used  . Tobacco comment: Quit 1999  Substance and Sexual Activity  . Alcohol use: Yes    Alcohol/week: 7.0 standard drinks    Types: 7 Glasses of wine per week    Frequency: Never    Comment: rare  . Drug use: No  . Sexual activity: Yes    Partners: Male    Birth control/protection: Surgical  Lifestyle  . Physical activity:    Days per week: Not on file    Minutes per session: Not on file  . Stress: Not on file  Relationships  . Social connections:    Talks on phone: Not on file    Gets together: Not on file    Attends religious service: Not on file    Active member of club or organization: Not on file    Attends meetings of clubs or organizations: Not on file    Relationship status: Not on file  Other Topics Concern  . Not on file  Social History Narrative  . Not on file   Allergies  Allergen Reactions  . Depo-Medrol [  Methylprednisolone Acetate] Swelling    After knee injection  . Sulfamethoxazole-Trimethoprim    Family History  Problem Relation Age of Onset  . Cancer Mother   . Colon cancer Mother   . Diabetes Father   . Heart disease Father   . Cancer Maternal Grandmother   . Diabetes Paternal Grandfather   . Esophageal cancer Neg Hx   . Rectal cancer Neg Hx   . Stomach cancer Neg Hx     Current Outpatient Medications (Endocrine & Metabolic):  .  methylPREDNISolone (MEDROL DOSEPAK) 4 MG TBPK tablet, Take by mouth as directed. Take 6 tablets on the first day prescribed then as directed. Marland Kitchen  PREMARIN 0.625 MG tablet, Take 1 tablet (0.625 mg total) by mouth daily. Marland Kitchen  SYNTHROID 88 MCG tablet, TAKE 1 TABLET BY MOUTH EVERY MORNING   Current Outpatient Medications (Respiratory):  .  azelastine (ASTELIN) 0.1 % nasal spray, Place 2 sprays into both nostrils 2 (two) times daily.  Use in each nostril as directed .  fluticasone (FLONASE) 50 MCG/ACT nasal spray, Place into both nostrils daily. Marland Kitchen  loratadine (CLARITIN) 10 MG tablet, Take 10 mg by mouth daily. .  montelukast (SINGULAIR) 10 MG tablet, Take 1 tablet (10 mg total) by mouth at bedtime.  Current Outpatient Medications (Analgesics):  .  HYDROcodone-acetaminophen (NORCO/VICODIN) 5-325 MG tablet, Take 1 tablet by mouth every 6 (six) hours as needed for moderate pain.   Current Outpatient Medications (Other):  .  baclofen (LIORESAL) 10 MG tablet, TAKE 1 TABLET BY MOUTH AT BEDTIME AS NEEDED FOR MUSCLE SPASMS .  cholecalciferol (VITAMIN D) 1000 units tablet, Take 1,000 Units by mouth daily. .  ciprofloxacin (CIPRO) 500 MG tablet, Take 1 tablet (500 mg total) by mouth 2 (two) times daily. Marland Kitchen  docusate sodium (COLACE) 100 MG capsule, Take 100 mg by mouth daily.  Marland Kitchen  gabapentin (NEURONTIN) 300 MG capsule, TAKE 3 CAPSULES BY MOUTH AT BEDTIME. Marland Kitchen  LINZESS 290 MCG CAPS capsule, TAKE 1 CAPSULE BY MOUTH ONCE DAILY BEFORE BREAKFAST .  lisdexamfetamine (VYVANSE) 30 MG capsule, Take 1 capsule (30 mg total) by mouth daily. .  metroNIDAZOLE (FLAGYL) 500 MG tablet, Take 1 tablet (500 mg total) by mouth 3 (three) times daily. .  traZODone (DESYREL) 50 MG tablet, Take 0.5-1 tablets (25-50 mg total) by mouth at bedtime as needed for sleep. Marland Kitchen  venlafaxine XR (EFFEXOR XR) 37.5 MG 24 hr capsule, Take 1 capsule (37.5 mg total) by mouth daily with breakfast. .  Gabapentin Enacarbil (HORIZANT) 600 MG TBCR, Take 1 tablet (600 mg total) by mouth at bedtime. Marland Kitchen  lisdexamfetamine (VYVANSE) 30 MG capsule, Take 1 capsule orally once daily before breakfast.    Past medical history, social, surgical and family history all reviewed in electronic medical record.  No pertanent information unless stated regarding to the chief complaint.   Review of Systems:  No headache, visual changes, nausea, vomiting, diarrhea, constipation, dizziness, abdominal  pain, skin rash, fevers, chills, night sweats, weight loss, swollen lymph nodes, body aches, joint swelling, , chest pain, shortness of breath, mood changes.  Positive muscle aches  Objective  Blood pressure 120/76, pulse 78, height 5\' 4"  (1.626 m), weight 136 lb (61.7 kg), SpO2 98 %.   General: No apparent distress alert and oriented x3 mood and affect normal, dressed appropriately.  HEENT: Pupils equal, extraocular movements intact  Respiratory: Patient's speak in full sentences and does not appear short of breath  Cardiovascular: No lower extremity edema, non tender, no erythema  Skin: Warm dry intact with no signs of infection or rash on extremities or on axial skeleton.  Abdomen: Soft nontender  Neuro: Cranial nerves II through XII are intact, neurovascularly intact in all extremities with 2+ DTRs and 2+ pulses.  Lymph: No lymphadenopathy of posterior or anterior cervical chain or axillae bilaterally.  Gait normal with good balance and coordination.  MSK:  Non tender with full range of motion and good stability and symmetric strength and tone of elbows, wrist, hip, knee and ankles bilaterally.  Right shoulder exam does have some mild atrophy of the muscle shoulder musculature.  Patient does have some tenderness to palpation.  Limited range of motion especially with external range of motion.  Mild crepitus.  Positive impingement contralateral shoulder fairly unremarkable.  Procedure: Real-time Ultrasound Guided Injection of right glenohumeral joint Device: GE Logiq Q7  Ultrasound guided injection is preferred based studies that show increased duration, increased effect, greater accuracy, decreased procedural pain, increased response rate with ultrasound guided versus blind injection.  Verbal informed consent obtained.  Time-out conducted.  Noted no overlying erythema, induration, or other signs of local infection.  Skin prepped in a sterile fashion.  Local anesthesia: Topical Ethyl  chloride.  With sterile technique and under real time ultrasound guidance:  Joint visualized.  23g 1  inch needle inserted posterior approach. Pictures taken for needle placement. Patient did have injection of 2 cc of 1% lidocaine, 2 cc of 0.5% Marcaine, and 1.0 cc of Kenalog 40 mg/dL. Completed without difficulty  Pain immediately resolved suggesting accurate placement of the medication.  Advised to call if fevers/chills, erythema, induration, drainage, or persistent bleeding.  Images permanently stored and available for review in the ultrasound unit.  Impression: Technically successful ultrasound guided injection.    Impression and Recommendations:     This case required medical decision making of moderate complexity. The above documentation has been reviewed and is accurate and complete Lyndal Pulley, DO       Note: This dictation was prepared with Dragon dictation along with smaller phrase technology. Any transcriptional errors that result from this process are unintentional.

## 2018-12-29 ENCOUNTER — Other Ambulatory Visit: Payer: Self-pay | Admitting: Family Medicine

## 2018-12-29 DIAGNOSIS — M47812 Spondylosis without myelopathy or radiculopathy, cervical region: Secondary | ICD-10-CM

## 2018-12-29 MED FILL — AZELASTINE HCL 137 MCG SPRY: 0.1 | 30 days supply | Qty: 30 | Fill #1

## 2018-12-31 MED FILL — VENLAFAXINE HCL ER 37.5 MG: 37.5 | 30 days supply | Qty: 30 | Fill #1

## 2018-12-31 MED FILL — GABAPENTIN 300 MG CAPSULE: 300 | 30 days supply | Qty: 90 | Fill #0

## 2018-12-31 MED FILL — BACLOFEN 10 MG TABS: 10 | 30 days supply | Qty: 30 | Fill #0

## 2019-01-04 ENCOUNTER — Encounter: Payer: 59 | Admitting: Gastroenterology

## 2019-01-21 MED FILL — SUPREP BOWEL PREP KIT: 17.5-3.13-1 | 1 days supply | Qty: 354 | Fill #0

## 2019-01-21 MED FILL — LINZESS 290 MCG CAPSULE: 290 | 90 days supply | Qty: 90 | Fill #2

## 2019-01-24 MED FILL — MONTELUKAST SOD 10 MG TAB: 10 | 90 days supply | Qty: 90 | Fill #1

## 2019-01-24 MED FILL — BACLOFEN 10 MG TABS: 10 | 30 days supply | Qty: 30 | Fill #1

## 2019-01-24 MED FILL — GABAPENTIN 300 MG CAPSULE: 300 | 30 days supply | Qty: 90 | Fill #1

## 2019-01-25 ENCOUNTER — Telehealth: Payer: Self-pay | Admitting: Gastroenterology

## 2019-01-25 NOTE — Telephone Encounter (Signed)
Spoke with patient regarding screening Covid-19 questions Covid-19 Screening Questions:  Do you now or have you had a fever in the last 14 days? no  Do you have any respiratory symptoms of shortness of breath or cough now or in the last 14 days? no  Do you have any family members or close contacts with diagnosed or suspected Covid-19 in the past 14 days? no  Have you been tested for Covid-19 and found to be positive? no  Pt made aware of that care partner may wait in the car or come up to the lobby during the procedure but will need to provide their own mask.

## 2019-01-26 ENCOUNTER — Ambulatory Visit (AMBULATORY_SURGERY_CENTER): Payer: 59 | Admitting: Gastroenterology

## 2019-01-26 ENCOUNTER — Other Ambulatory Visit: Payer: Self-pay

## 2019-01-26 ENCOUNTER — Encounter: Payer: Self-pay | Admitting: Gastroenterology

## 2019-01-26 VITALS — BP 126/64 | HR 64 | Temp 98.1°F | Resp 16 | Ht 64.0 in | Wt 136.0 lb

## 2019-01-26 DIAGNOSIS — D125 Benign neoplasm of sigmoid colon: Secondary | ICD-10-CM | POA: Diagnosis not present

## 2019-01-26 DIAGNOSIS — Z86010 Personal history of colonic polyps: Secondary | ICD-10-CM

## 2019-01-26 DIAGNOSIS — D123 Benign neoplasm of transverse colon: Secondary | ICD-10-CM

## 2019-01-26 DIAGNOSIS — D120 Benign neoplasm of cecum: Secondary | ICD-10-CM | POA: Diagnosis not present

## 2019-01-26 DIAGNOSIS — Z1211 Encounter for screening for malignant neoplasm of colon: Secondary | ICD-10-CM | POA: Diagnosis not present

## 2019-01-26 DIAGNOSIS — Z800 Family history of malignant neoplasm of digestive organs: Secondary | ICD-10-CM

## 2019-01-26 MED ORDER — SODIUM CHLORIDE 0.9 % IV SOLN: 500.0000 mL | Freq: Once | INTRAVENOUS | Status: DC

## 2019-01-26 NOTE — Op Note (Signed)
[facility] Patient Name: Kara Kennedy Procedure Date: 01/26/2019 11:04 AM MRN: [id number] Endoscopist: Mauri Pole , MD Age: 63 Referring MD:  Date of Birth: Sep 03, 1955 Gender: Female Account #: [id number] Procedure:                Colonoscopy Indications:              Colon cancer screening in patient with 1st-degree                            relative having advanced adenoma of the colon                            before age 25, Colon cancer screening in patient                            with multiple 1st-degree relatives having advanced                            adenomas of the colon, Screening in patient at                            increased risk: Family history of 1st-degree                            relative with colorectal cancer, High risk colon                            cancer surveillance: Personal history of colonic                            polyps Medicines:                Monitored Anesthesia Care Procedure:                Pre-Anesthesia Assessment:                           - Prior to the procedure, a History and Physical                            was performed, and patient medications and                            allergies were reviewed. The patient's tolerance of                            previous anesthesia was also reviewed. The risks                            and benefits of the procedure and the sedation                            options and risks were discussed with the patient.  All questions were answered, and informed consent                            was obtained. Prior Anticoagulants: The patient has                            taken no previous anticoagulant or antiplatelet                            agents. ASA Grade Assessment: II - A patient with                            mild systemic disease. After reviewing the risks                            and benefits, the patient was deemed in                             satisfactory condition to undergo the procedure.                           After obtaining informed consent, the colonoscope                            was passed under direct vision. Throughout the                            procedure, the patient's blood pressure, pulse, and                            oxygen saturations were monitored continuously. The                            Colonoscope was introduced through the anus and                            advanced to the the cecum, identified by                            appendiceal orifice and ileocecal valve. The                            colonoscopy was performed without difficulty. The                            patient tolerated the procedure well. The quality                            of the bowel preparation was good. The ileocecal                            valve, appendiceal orifice, and rectum were  photographed. Scope In: 66:06:30 AM Scope Out: 11:41:55 AM Scope Withdrawal Time: 0 hours 9 minutes 55 seconds  Total Procedure Duration: 0 hours 25 minutes 44 seconds  Findings:                 The perianal and digital rectal examinations were                            normal.                           Two sessile polyps were found in the transverse                            colon and cecum. The polyps were 1 to 2 mm in size.                            These polyps were removed with a cold biopsy                            forceps. Resection and retrieval were complete.                           A 5 mm polyp was found in the sigmoid colon. The                            polyp was sessile. The polyp was removed with a                            cold snare. Resection and retrieval were complete.                           Scattered small and large-mouthed diverticula were                            found in the sigmoid colon, descending colon,                            transverse colon,  hepatic flexure and ascending                            colon.                           Non-bleeding internal hemorrhoids were found during                            retroflexion. The hemorrhoids were medium-sized. Complications:            No immediate complications. Estimated Blood Loss:     Estimated blood loss was minimal. Impression:               - Two 1 to 2 mm polyps in the transverse colon and  in the cecum, removed with a cold biopsy forceps.                            Resected and retrieved.                           - One 5 mm polyp in the sigmoid colon, removed with                            a cold snare. Resected and retrieved.                           - Moderate diverticulosis in the sigmoid colon, in                            the descending colon, in the transverse colon, at                            the hepatic flexure and in the ascending colon.                           - Non-bleeding internal hemorrhoids. Recommendation:           - Patient has a contact number available for                            emergencies. The signs and symptoms of potential                            delayed complications were discussed with the                            patient. Return to normal activities tomorrow.                            Written discharge instructions were provided to the                            patient.                           - Resume previous diet.                           - Continue present medications.                           - Await pathology results.                           - Repeat colonoscopy in 3 - 5 years for                            surveillance based on pathology results.                           -  Refer to a genetics counselor at appointment to                            be scheduled. Mauri Pole, MD 01/26/2019 11:46:49 AM This report has been signed electronically.

## 2019-01-26 NOTE — Progress Notes (Signed)
Pt's states no medical or surgical changes since previsit or office visit. 

## 2019-01-26 NOTE — Patient Instructions (Signed)
Information on polyps, diverticulosis and hemorrhoids given to you today.  Await pathology results.  YOU HAD AN ENDOSCOPIC PROCEDURE TODAY AT THE [facility]:   Refer to the procedure report that was given to you for any specific questions about what was found during the examination.  If the procedure report does not answer your questions, please call your gastroenterologist to clarify.  If you requested that your care partner not be given the details of your procedure findings, then the procedure report has been included in a sealed envelope for you to review at your convenience later.  YOU SHOULD EXPECT: Some feelings of bloating in the abdomen. Passage of more gas than usual.  Walking can help get rid of the air that was put into your GI tract during the procedure and reduce the bloating. If you had a lower endoscopy (such as a colonoscopy or flexible sigmoidoscopy) you may notice spotting of blood in your stool or on the toilet paper. If you underwent a bowel prep for your procedure, you may not have a normal bowel movement for a few days.  Please Note:  You might notice some irritation and congestion in your nose or some drainage.  This is from the oxygen used during your procedure.  There is no need for concern and it should clear up in a day or so.  SYMPTOMS TO REPORT IMMEDIATELY:   Following lower endoscopy (colonoscopy or flexible sigmoidoscopy):  Excessive amounts of blood in the stool  Significant tenderness or worsening of abdominal pains  Swelling of the abdomen that is new, acute  Fever of 100F or higher   For urgent or emergent issues, a gastroenterologist can be reached at any hour by calling [phone].   DIET:  We do recommend a small meal at first, but then you may proceed to your regular diet.  Drink plenty of fluids but you should avoid alcoholic beverages for 24 hours.  ACTIVITY:  You should plan to take it easy for the rest of today and you should NOT  DRIVE or use heavy machinery until tomorrow (because of the sedation medicines used during the test).    FOLLOW UP: Our staff will call the number listed on your records 48-72 hours following your procedure to check on you and address any questions or concerns that you may have regarding the information given to you following your procedure. If we do not reach you, we will leave a message.  We will attempt to reach you two times.  During this call, we will ask if you have developed any symptoms of COVID 19. If you develop any symptoms (ie: fever, flu-like symptoms, shortness of breath, cough etc.) before then, please call [phone].  If you test positive for Covid 19 in the 2 weeks post procedure, please call and report this information to us.    If any biopsies were taken you will be contacted by phone or by letter within the next 1-3 weeks.  Please call us at [phone] if you have not heard about the biopsies in 3 weeks.    SIGNATURES/CONFIDENTIALITY: You and/or your care partner have signed paperwork which will be entered into your electronic medical record.  These signatures attest to the fact that that the information above on your After Visit Summary has been reviewed and is understood.  Full responsibility of the confidentiality of this discharge information lies with you and/or your care-partner. 

## 2019-01-26 NOTE — Progress Notes (Signed)
A and O x3. Report to RN. Tolerated MAC anesthesia well.

## 2019-01-26 NOTE — Progress Notes (Signed)
Called to room to assist during endoscopic procedure.  Patient ID and intended procedure confirmed with present staff. Received instructions for my participation in the procedure from the performing physician.  

## 2019-01-31 ENCOUNTER — Telehealth: Payer: Self-pay | Admitting: *Deleted

## 2019-01-31 MED FILL — traZODone HCL 50 MG TABS: 50 | 90 days supply | Qty: 90 | Fill #1

## 2019-01-31 NOTE — Telephone Encounter (Signed)
  Follow up Call-  Call back number 01/26/2019  Post procedure Call Back phone  # [phone]  Permission to leave phone message Yes  Some recent data might be hidden     Patient questions:  Do you have a fever, pain , or abdominal swelling? No. Pain Score  0 *  Have you tolerated food without any problems? Yes.    Have you been able to return to your normal activities? Yes.    Do you have any questions about your discharge instructions: Diet   No. Medications  No. Follow up visit  No.  Do you have questions or concerns about your Care? No.  Actions: * If pain score is 4 or above: No action needed, pain <4.  1. Have you developed a fever since your procedure? no  2.   Have you had an respiratory symptoms (SOB or cough) since your procedure? no  3.   Have you tested positive for COVID 19 since your procedure no  4.   Have you had any family members/close contacts diagnosed with the COVID 19 since your procedure?  no   If yes to any of these questions please route to Joylene John, RN and Alphonsa Gin, Therapist, sports.

## 2019-02-01 ENCOUNTER — Encounter: Payer: Self-pay | Admitting: Gastroenterology

## 2019-02-08 ENCOUNTER — Ambulatory Visit: Payer: 59 | Admitting: Family Medicine

## 2019-02-08 DIAGNOSIS — Z0289 Encounter for other administrative examinations: Secondary | ICD-10-CM

## 2019-02-08 NOTE — Progress Notes (Deleted)
[facility] [address], [place] [address] Phone: [phone] Subjective:    I'm seeing this patient by the request  of:    CC:   [id number]  Kara Kennedy is a 63 y.o. female coming in with complaint of ***  Onset-  Location Duration-  Character- Aggravating factors- Reliving factors-  Therapies tried-  Severity-     Past Medical History:  Diagnosis Date  . Allergic rhinitis 07/30/2012  . Allergy   . Anxiety 03/06/2014  . Chronic neck pain 09/06/2012  . Endometriosis   . Family history of colon cancer 12/29/2016  . History of pyelonephritis 07/30/2012  . Hypothyroidism 03/06/2014  . IBS (irritable bowel syndrome) 12/29/2016  . Insomnia 08/19/2011  . Pneumonia    hx of 2009   . PONV (postoperative nausea and vomiting)   . Post menopausal syndrome 03/06/2014   Past Surgical History:  Procedure Laterality Date  . ABDOMINAL HYSTERECTOMY    . cervical fusion C5-C7     . CESAREAN SECTION    . DILATION AND CURETTAGE OF UTERUS    . GALLBLADDER SURGERY    . KNEE ARTHROSCOPY WITH MEDIAL MENISECTOMY Left 04/30/2018   Procedure: LEFT KNEE ARTHROSCOPY WITH DEBRIDEMENT;  Surgeon: Mcarthur Rossetti, MD;  Location: [facility];  Service: Orthopedics;  Laterality: Left;  . SPINE SURGERY     Social History   Socioeconomic History  . Marital status: Married    Spouse name: Not on file  . Number of children: Not on file  . Years of education: Not on file  . Highest education level: Not on file  Occupational History  . Not on file  Social Needs  . Financial resource strain: Not on file  . Food insecurity    Worry: Not on file    Inability: Not on file  . Transportation needs    Medical: Not on file    Non-medical: Not on file  Tobacco Use  . Smoking status: Former Research scientist (life sciences)  . Smokeless tobacco: Never Used  . Tobacco comment: Quit 1999  Substance and Sexual Activity  . Alcohol use: Yes    Alcohol/week: 7.0 standard drinks    Types:  7 Glasses of wine per week    Frequency: Never    Comment: rare  . Drug use: No  . Sexual activity: Yes    Partners: Male    Birth control/protection: Surgical  Lifestyle  . Physical activity    Days per week: Not on file    Minutes per session: Not on file  . Stress: Not on file  Relationships  . Social Herbalist on phone: Not on file    Gets together: Not on file    Attends religious service: Not on file    Active member of club or organization: Not on file    Attends meetings of clubs or organizations: Not on file    Relationship status: Not on file  Other Topics Concern  . Not on file  Social History Narrative  . Not on file   Allergies  Allergen Reactions  . Depo-Medrol [Methylprednisolone Acetate] Swelling    After knee injection  . Sulfamethoxazole-Trimethoprim    Family History  Problem Relation Age of Onset  . Cancer Mother   . Colon cancer Mother   . Diabetes Father   . Heart disease Father   . Cancer Maternal Grandmother   . Diabetes Paternal Grandfather   . Esophageal cancer Neg Hx   .  Rectal cancer Neg Hx   . Stomach cancer Neg Hx     Current Outpatient Medications (Endocrine & Metabolic):  .  methylPREDNISolone (MEDROL DOSEPAK) 4 MG TBPK tablet, Take by mouth as directed. Take 6 tablets on the first day prescribed then as directed. (Patient not taking: Reported on 01/26/2019) .  PREMARIN 0.625 MG tablet, Take 1 tablet (0.625 mg total) by mouth daily. Marland Kitchen  SYNTHROID 88 MCG tablet, TAKE 1 TABLET BY MOUTH EVERY MORNING   Current Outpatient Medications (Respiratory):  .  azelastine (ASTELIN) 0.1 % nasal spray, Place 2 sprays into both nostrils 2 (two) times daily. Use in each nostril as directed .  fluticasone (FLONASE) 50 MCG/ACT nasal spray, Place into both nostrils daily. Marland Kitchen  loratadine (CLARITIN) 10 MG tablet, Take 10 mg by mouth daily. .  montelukast (SINGULAIR) 10 MG tablet, Take 1 tablet (10 mg total) by mouth at bedtime.  Current  Outpatient Medications (Analgesics):  .  HYDROcodone-acetaminophen (NORCO/VICODIN) 5-325 MG tablet, Take 1 tablet by mouth every 6 (six) hours as needed for moderate pain.   Current Outpatient Medications (Other):  .  baclofen (LIORESAL) 10 MG tablet, TAKE 1 TABLET BY MOUTH AT BEDTIME AS NEEDED FOR MUSCLE SPASMS .  cholecalciferol (VITAMIN D) 1000 units tablet, Take 1,000 Units by mouth daily. .  ciprofloxacin (CIPRO) 500 MG tablet, Take 1 tablet (500 mg total) by mouth 2 (two) times daily. (Patient not taking: Reported on 01/26/2019) .  docusate sodium (COLACE) 100 MG capsule, Take 100 mg by mouth daily.  Marland Kitchen  gabapentin (NEURONTIN) 300 MG capsule, TAKE 3 CAPSULES BY MOUTH AT BEDTIME. .  Gabapentin Enacarbil (HORIZANT) 600 MG TBCR, Take 1 tablet (600 mg total) by mouth at bedtime. (Patient not taking: Reported on 01/26/2019) .  LINZESS 290 MCG CAPS capsule, TAKE 1 CAPSULE BY MOUTH ONCE DAILY BEFORE BREAKFAST .  lisdexamfetamine (VYVANSE) 30 MG capsule, Take 1 capsule orally once daily before breakfast. .  lisdexamfetamine (VYVANSE) 30 MG capsule, Take 1 capsule (30 mg total) by mouth daily. .  metroNIDAZOLE (FLAGYL) 500 MG tablet, Take 1 tablet (500 mg total) by mouth 3 (three) times daily. (Patient not taking: Reported on 01/26/2019) .  traZODone (DESYREL) 50 MG tablet, Take 0.5-1 tablets (25-50 mg total) by mouth at bedtime as needed for sleep. Marland Kitchen  venlafaxine XR (EFFEXOR XR) 37.5 MG 24 hr capsule, Take 1 capsule (37.5 mg total) by mouth daily with breakfast.    Past medical history, social, surgical and family history all reviewed in electronic medical record.  No pertanent information unless stated regarding to the chief complaint.   Review of Systems:  No headache, visual changes, nausea, vomiting, diarrhea, constipation, dizziness, abdominal pain, skin rash, fevers, chills, night sweats, weight loss, swollen lymph nodes, body aches, joint swelling, muscle aches, chest pain, shortness of breath,  mood changes.   Objective  There were no vitals taken for this visit. Systems examined below as of    General: No apparent distress alert and oriented x3 mood and affect normal, dressed appropriately.  HEENT: Pupils equal, extraocular movements intact  Respiratory: Patient's speak in full sentences and does not appear short of breath  Cardiovascular: No lower extremity edema, non tender, no erythema  Skin: Warm dry intact with no signs of infection or rash on extremities or on axial skeleton.  Abdomen: Soft nontender  Neuro: Cranial nerves II through XII are intact, neurovascularly intact in all extremities with 2+ DTRs and 2+ pulses.  Lymph: No lymphadenopathy of posterior or  anterior cervical chain or axillae bilaterally.  Gait normal with good balance and coordination.  MSK:  Non tender with full range of motion and good stability and symmetric strength and tone of shoulders, elbows, wrist, hip, knee and ankles bilaterally.     Impression and Recommendations:     This case required medical decision making of moderate complexity. The above documentation has been reviewed and is accurate and complete Lyndal Pulley, DO       Note: This dictation was prepared with Dragon dictation along with smaller phrase technology. Any transcriptional errors that result from this process are unintentional.

## 2019-02-09 ENCOUNTER — Encounter: Payer: Self-pay | Admitting: Family Medicine

## 2019-02-09 ENCOUNTER — Other Ambulatory Visit: Payer: Self-pay

## 2019-02-09 DIAGNOSIS — Z800 Family history of malignant neoplasm of digestive organs: Secondary | ICD-10-CM

## 2019-02-09 DIAGNOSIS — Z86010 Personal history of colonic polyps: Secondary | ICD-10-CM

## 2019-02-10 NOTE — Progress Notes (Signed)
[facility] [address], [place] [address] Phone: [phone] Subjective:   I Kara Kennedy am serving as a Education administrator for Dr. Hulan Saas.    CC: Right shoulder follow-up  [id number]   12/27/2018 Patient given injection today.  Does have history of spondylosis that could be contributing as well.  We discussed posture and ergonomics.  Discussed which activities of doing which wants to avoid.  Patient is to increase activity slowly.  We discussed with patient that this may get worse.  Other possibilities include PRP.  Patient will consider this.  Follow-up again 6 weeks  02/11/2019 Kara Kennedy is a 63 y.o. female coming in with complaint of right shoulder pain. States the shoulder is not doing well. Would like a refill on effexor. Patient does feel that the Effexor has been somewhat helpful.  Continues to have a dull, throbbing aching pain.  Patient states that the injection into the shoulder helped for 2 weeks and then seemed to come back.  Patient does have neck pain and has had a history of a two-level fusion previously.  Patient states intermittent radiation down but seems to be more secondary in the shoulder.     Past Medical History:  Diagnosis Date  . Allergic rhinitis 07/30/2012  . Allergy   . Anxiety 03/06/2014  . Chronic neck pain 09/06/2012  . Endometriosis   . Family history of colon cancer 12/29/2016  . History of pyelonephritis 07/30/2012  . Hypothyroidism 03/06/2014  . IBS (irritable bowel syndrome) 12/29/2016  . Insomnia 08/19/2011  . Pneumonia    hx of 2009   . PONV (postoperative nausea and vomiting)   . Post menopausal syndrome 03/06/2014   Past Surgical History:  Procedure Laterality Date  . ABDOMINAL HYSTERECTOMY    . cervical fusion C5-C7     . CESAREAN SECTION    . DILATION AND CURETTAGE OF UTERUS    . GALLBLADDER SURGERY    . KNEE ARTHROSCOPY WITH MEDIAL MENISECTOMY Left 04/30/2018   Procedure: LEFT KNEE ARTHROSCOPY WITH  DEBRIDEMENT;  Surgeon: Mcarthur Rossetti, MD;  Location: [facility];  Service: Orthopedics;  Laterality: Left;  . SPINE SURGERY     Social History   Socioeconomic History  . Marital status: Married    Spouse name: Not on file  . Number of children: Not on file  . Years of education: Not on file  . Highest education level: Not on file  Occupational History  . Not on file  Social Needs  . Financial resource strain: Not on file  . Food insecurity    Worry: Not on file    Inability: Not on file  . Transportation needs    Medical: Not on file    Non-medical: Not on file  Tobacco Use  . Smoking status: Former Research scientist (life sciences)  . Smokeless tobacco: Never Used  . Tobacco comment: Quit 1999  Substance and Sexual Activity  . Alcohol use: Yes    Alcohol/week: 7.0 standard drinks    Types: 7 Glasses of wine per week    Frequency: Never    Comment: rare  . Drug use: No  . Sexual activity: Yes    Partners: Male    Birth control/protection: Surgical  Lifestyle  . Physical activity    Days per week: Not on file    Minutes per session: Not on file  . Stress: Not on file  Relationships  . Social connections    Talks on phone: Not on  file    Gets together: Not on file    Attends religious service: Not on file    Active member of club or organization: Not on file    Attends meetings of clubs or organizations: Not on file    Relationship status: Not on file  Other Topics Concern  . Not on file  Social History Narrative  . Not on file   Allergies  Allergen Reactions  . Depo-Medrol [Methylprednisolone Acetate] Swelling    After knee injection  . Sulfamethoxazole-Trimethoprim    Family History  Problem Relation Age of Onset  . Cancer Mother   . Colon cancer Mother   . Diabetes Father   . Heart disease Father   . Cancer Maternal Grandmother   . Diabetes Paternal Grandfather   . Esophageal cancer Neg Hx   . Rectal cancer Neg Hx   . Stomach cancer Neg Hx     Current Outpatient  Medications (Endocrine & Metabolic):  .  methylPREDNISolone (MEDROL DOSEPAK) 4 MG TBPK tablet, Take by mouth as directed. Take 6 tablets on the first day prescribed then as directed. Marland Kitchen  PREMARIN 0.625 MG tablet, Take 1 tablet (0.625 mg total) by mouth daily. Marland Kitchen  SYNTHROID 88 MCG tablet, TAKE 1 TABLET BY MOUTH EVERY MORNING   Current Outpatient Medications (Respiratory):  .  azelastine (ASTELIN) 0.1 % nasal spray, Place 2 sprays into both nostrils 2 (two) times daily. Use in each nostril as directed .  fluticasone (FLONASE) 50 MCG/ACT nasal spray, Place into both nostrils daily. Marland Kitchen  loratadine (CLARITIN) 10 MG tablet, Take 10 mg by mouth daily. .  montelukast (SINGULAIR) 10 MG tablet, Take 1 tablet (10 mg total) by mouth at bedtime.  Current Outpatient Medications (Analgesics):  .  HYDROcodone-acetaminophen (NORCO/VICODIN) 5-325 MG tablet, Take 1 tablet by mouth every 6 (six) hours as needed for moderate pain.   Current Outpatient Medications (Other):  .  baclofen (LIORESAL) 10 MG tablet, TAKE 1 TABLET BY MOUTH AT BEDTIME AS NEEDED FOR MUSCLE SPASMS .  cholecalciferol (VITAMIN D) 1000 units tablet, Take 1,000 Units by mouth daily. .  ciprofloxacin (CIPRO) 500 MG tablet, Take 1 tablet (500 mg total) by mouth 2 (two) times daily. Marland Kitchen  docusate sodium (COLACE) 100 MG capsule, Take 100 mg by mouth daily.  Marland Kitchen  gabapentin (NEURONTIN) 300 MG capsule, TAKE 3 CAPSULES BY MOUTH AT BEDTIME. .  Gabapentin Enacarbil (HORIZANT) 600 MG TBCR, Take 1 tablet (600 mg total) by mouth at bedtime. Marland Kitchen  LINZESS 290 MCG CAPS capsule, TAKE 1 CAPSULE BY MOUTH ONCE DAILY BEFORE BREAKFAST .  lisdexamfetamine (VYVANSE) 30 MG capsule, Take 1 capsule (30 mg total) by mouth daily. .  metroNIDAZOLE (FLAGYL) 500 MG tablet, Take 1 tablet (500 mg total) by mouth 3 (three) times daily. .  traZODone (DESYREL) 50 MG tablet, Take 0.5-1 tablets (25-50 mg total) by mouth at bedtime as needed for sleep. Marland Kitchen  lisdexamfetamine (VYVANSE) 30 MG  capsule, Take 1 capsule orally once daily before breakfast. .  venlafaxine XR (EFFEXOR XR) 37.5 MG 24 hr capsule, Take 1 capsule (37.5 mg total) by mouth daily with breakfast.    Past medical history, social, surgical and family history all reviewed in electronic medical record.  No pertanent information unless stated regarding to the chief complaint.   Review of Systems:  No headache, visual changes, nausea, vomiting, diarrhea, constipation, dizziness, abdominal pain, skin rash, fevers, chills, night sweats, weight loss, swollen lymph nodes, body aches, joint swelling, muscle aches, chest pain,  shortness of breath, mood changes.   Objective  Blood pressure 110/90, pulse 76, height 5\' 4"  (1.626 m), weight 139 lb (63 kg), SpO2 98 %. f    General: No apparent distress alert and oriented x3 mood and affect normal, dressed appropriately.  HEENT: Pupils equal, extraocular movements intact  Respiratory: Patient's speak in full sentences and does not appear short of breath  Cardiovascular: No lower extremity edema, non tender, no erythema  Skin: Warm dry intact with no signs of infection or rash on extremities or on axial skeleton.  Abdomen: Soft nontender  Neuro: Cranial nerves II through XII are intact, neurovascularly intact in all extremities with 2+ DTRs and 2+ pulses.  Lymph: No lymphadenopathy of posterior or anterior cervical chain or axillae bilaterally.  Gait normal with good balance and coordination.  MSK:  tender with full range of motion and good stability and symmetric strength and tone of elbows, wrist, hip, knee and ankles bilaterally.  Right shoulder exam shows the patient does have some mild atrophy.  Fullness of the acromioclavicular joint compared to the contralateral side.  Tender to palpation more over the acromioclavicular joint.  Patient does have mild limited range of motion in all planes though.  4 out of 5 strength of the rotator cuff compared to the contralateral side.   Positive crossover.  Procedure: Real-time Ultrasound Guided Injection of right acromioclavicular joint Device: GE Logiq Q7 Ultrasound guided injection is preferred based studies that show increased duration, increased effect, greater accuracy, decreased procedural pain, increased response rate, and decreased cost with ultrasound guided versus blind injection.  Verbal informed consent obtained.  Time-out conducted.  Noted no overlying erythema, induration, or other signs of local infection.  Skin prepped in a sterile fashion.  Local anesthesia: Topical Ethyl chloride.  With sterile technique and under real time ultrasound guidance: With a 25-gauge half inch needle injected with 0.5 cc of 0.5% Marcaine and 0.5 cc of Kenalog 40 mg/mL Completed without difficulty  Pain immediately resolved suggesting accurate placement of the medication.  Advised to call if fevers/chills, erythema, induration, drainage, or persistent bleeding.  Images permanently stored and available for review in the ultrasound unit.  Impression: Technically successful ultrasound guided injection.   Impression and Recommendations:     This case required medical decision making of moderate complexity. The above documentation has been reviewed and is accurate and complete Lyndal Pulley, DO       Note: This dictation was prepared with Dragon dictation along with smaller phrase technology. Any transcriptional errors that result from this process are unintentional.

## 2019-02-11 ENCOUNTER — Encounter: Payer: Self-pay | Admitting: Family Medicine

## 2019-02-11 ENCOUNTER — Ambulatory Visit: Payer: Self-pay

## 2019-02-11 ENCOUNTER — Other Ambulatory Visit: Payer: Self-pay

## 2019-02-11 ENCOUNTER — Ambulatory Visit (INDEPENDENT_AMBULATORY_CARE_PROVIDER_SITE_OTHER): Payer: 59 | Admitting: Family Medicine

## 2019-02-11 VITALS — BP 110/90 | HR 76 | Ht 64.0 in | Wt 139.0 lb

## 2019-02-11 DIAGNOSIS — M47812 Spondylosis without myelopathy or radiculopathy, cervical region: Secondary | ICD-10-CM | POA: Diagnosis not present

## 2019-02-11 DIAGNOSIS — M75101 Unspecified rotator cuff tear or rupture of right shoulder, not specified as traumatic: Secondary | ICD-10-CM

## 2019-02-11 DIAGNOSIS — M19011 Primary osteoarthritis, right shoulder: Secondary | ICD-10-CM | POA: Diagnosis not present

## 2019-02-11 DIAGNOSIS — G8929 Other chronic pain: Secondary | ICD-10-CM

## 2019-02-11 DIAGNOSIS — M19019 Primary osteoarthritis, unspecified shoulder: Secondary | ICD-10-CM | POA: Insufficient documentation

## 2019-02-11 DIAGNOSIS — M25511 Pain in right shoulder: Secondary | ICD-10-CM

## 2019-02-11 MED ORDER — VENLAFAXINE HCL ER 37.5 MG PO CP24: 37.5000 mg | ORAL_CAPSULE | Freq: Every day | ORAL | 0 refills | Status: DC

## 2019-02-11 MED FILL — VENLAFAXINE HCL ER 37.5 MG: 37.5 | 30 days supply | Qty: 30 | Fill #0

## 2019-02-11 NOTE — Patient Instructions (Signed)
Lets see how it goes Message me in 2 weeks. If not better we will get MRI

## 2019-02-11 NOTE — Assessment & Plan Note (Signed)
Injection today.  Patient does have known arthritis of the shoulder as well.  When looking at the x-rays could not rule out the possibility of a early avascular necrosis.  I do believe with patient having the discomfort and pain we would need to consider the possibility of an MRI.  This could change medical management.  Patient has failed all other conservative therapy.  Patient is in agreement with the plan.

## 2019-02-14 ENCOUNTER — Encounter: Payer: Self-pay | Admitting: Family Medicine

## 2019-02-14 MED ORDER — PREDNISONE 50 MG PO TABS: 50.0000 mg | ORAL_TABLET | Freq: Every day | ORAL | 0 refills | Status: DC

## 2019-02-14 MED FILL — predniSONE 50 MG TABS: 50 | 5 days supply | Qty: 5 | Fill #0

## 2019-02-17 ENCOUNTER — Encounter: Payer: Self-pay | Admitting: Family Medicine

## 2019-02-21 ENCOUNTER — Other Ambulatory Visit: Payer: Self-pay | Admitting: Family Medicine

## 2019-02-21 MED FILL — VYVANSE 30 MG CAPSULE: 30 | 30 days supply | Qty: 30 | Fill #0

## 2019-02-21 MED FILL — PREMARIN 0.625 MG TABLET: 0.625 | 30 days supply | Qty: 30 | Fill #0

## 2019-02-21 MED FILL — BACLOFEN 10 MG TABS: 10 | 30 days supply | Qty: 30 | Fill #2

## 2019-02-22 ENCOUNTER — Encounter: Payer: Self-pay | Admitting: Family Medicine

## 2019-02-22 MED FILL — SYNTHROID 88 MCG TABLET: 88 | 90 days supply | Qty: 90 | Fill #0

## 2019-02-22 MED FILL — GABAPENTIN 300 MG CAPSULE: 300 | 30 days supply | Qty: 90 | Fill #0

## 2019-02-25 ENCOUNTER — Encounter: Payer: Self-pay | Admitting: Family Medicine

## 2019-02-25 ENCOUNTER — Other Ambulatory Visit: Payer: Self-pay

## 2019-02-25 DIAGNOSIS — G8929 Other chronic pain: Secondary | ICD-10-CM

## 2019-02-25 DIAGNOSIS — M5412 Radiculopathy, cervical region: Secondary | ICD-10-CM

## 2019-02-25 DIAGNOSIS — M25511 Pain in right shoulder: Secondary | ICD-10-CM

## 2019-02-28 ENCOUNTER — Other Ambulatory Visit: Payer: Self-pay

## 2019-02-28 DIAGNOSIS — G8929 Other chronic pain: Secondary | ICD-10-CM

## 2019-03-01 ENCOUNTER — Encounter: Payer: Self-pay | Admitting: Family Medicine

## 2019-03-02 MED ORDER — BACLOFEN 20 MG PO TABS: 20.0000 mg | ORAL_TABLET | Freq: Three times a day (TID) | ORAL | 1 refills | Status: DC

## 2019-03-03 MED FILL — BACLOFEN 20 MG TABLET: 20 | 30 days supply | Qty: 90 | Fill #0

## 2019-03-17 MED FILL — PREMARIN 0.625 MG TABLET: 0.625 | 30 days supply | Qty: 30 | Fill #1

## 2019-03-22 DIAGNOSIS — H5200 Hypermetropia, unspecified eye: Secondary | ICD-10-CM | POA: Diagnosis not present

## 2019-03-22 DIAGNOSIS — H524 Presbyopia: Secondary | ICD-10-CM | POA: Diagnosis not present

## 2019-03-22 DIAGNOSIS — Z135 Encounter for screening for eye and ear disorders: Secondary | ICD-10-CM | POA: Diagnosis not present

## 2019-03-22 DIAGNOSIS — H52229 Regular astigmatism, unspecified eye: Secondary | ICD-10-CM | POA: Diagnosis not present

## 2019-03-24 MED FILL — GABAPENTIN 300 MG CAPSULE: 300 | 30 days supply | Qty: 90 | Fill #1

## 2019-03-26 ENCOUNTER — Ambulatory Visit
Admission: RE | Admit: 2019-03-26 | Discharge: 2019-03-26 | Disposition: A | Discharge status: Home / Self Care | Payer: 59 | Source: Ambulatory Visit | Attending: Family Medicine | Admitting: Family Medicine

## 2019-03-26 ENCOUNTER — Other Ambulatory Visit: Payer: Self-pay

## 2019-03-26 DIAGNOSIS — M5412 Radiculopathy, cervical region: Secondary | ICD-10-CM

## 2019-03-26 DIAGNOSIS — Z981 Arthrodesis status: Secondary | ICD-10-CM | POA: Diagnosis not present

## 2019-03-26 DIAGNOSIS — M75111 Incomplete rotator cuff tear or rupture of right shoulder, not specified as traumatic: Secondary | ICD-10-CM | POA: Diagnosis not present

## 2019-03-26 DIAGNOSIS — G8929 Other chronic pain: Secondary | ICD-10-CM

## 2019-03-26 DIAGNOSIS — M5010 Cervical disc disorder with radiculopathy, unspecified cervical region: Secondary | ICD-10-CM | POA: Diagnosis not present

## 2019-03-27 ENCOUNTER — Encounter: Payer: Self-pay | Admitting: Family Medicine

## 2019-03-28 ENCOUNTER — Encounter: Payer: Self-pay | Admitting: Family Medicine

## 2019-03-29 ENCOUNTER — Other Ambulatory Visit: Payer: Self-pay

## 2019-03-29 DIAGNOSIS — G8929 Other chronic pain: Secondary | ICD-10-CM

## 2019-03-29 DIAGNOSIS — M25511 Pain in right shoulder: Secondary | ICD-10-CM

## 2019-03-30 DIAGNOSIS — M25511 Pain in right shoulder: Secondary | ICD-10-CM | POA: Diagnosis not present

## 2019-04-05 ENCOUNTER — Other Ambulatory Visit: Payer: Self-pay | Admitting: Family Medicine

## 2019-04-05 MED FILL — BACLOFEN 20 MG TABLET: 20 | 30 days supply | Qty: 90 | Fill #1

## 2019-04-06 MED FILL — VYVANSE 30 MG CAPSULE: 30 | 90 days supply | Qty: 90 | Fill #0

## 2019-04-06 NOTE — Telephone Encounter (Signed)
Okay to refill? 

## 2019-04-06 NOTE — Telephone Encounter (Signed)
Last fill 11/02/18  #90/0 Last OV 12/07/18

## 2019-04-10 DIAGNOSIS — M25511 Pain in right shoulder: Secondary | ICD-10-CM | POA: Insufficient documentation

## 2019-04-15 DIAGNOSIS — M7541 Impingement syndrome of right shoulder: Secondary | ICD-10-CM | POA: Diagnosis not present

## 2019-04-15 DIAGNOSIS — S43431A Superior glenoid labrum lesion of right shoulder, initial encounter: Secondary | ICD-10-CM | POA: Diagnosis not present

## 2019-04-15 DIAGNOSIS — M94211 Chondromalacia, right shoulder: Secondary | ICD-10-CM | POA: Diagnosis not present

## 2019-04-15 DIAGNOSIS — M75111 Incomplete rotator cuff tear or rupture of right shoulder, not specified as traumatic: Secondary | ICD-10-CM | POA: Diagnosis not present

## 2019-04-15 DIAGNOSIS — M19011 Primary osteoarthritis, right shoulder: Secondary | ICD-10-CM | POA: Diagnosis not present

## 2019-04-15 DIAGNOSIS — G8918 Other acute postprocedural pain: Secondary | ICD-10-CM | POA: Diagnosis not present

## 2019-04-15 MED FILL — OXYCODONE-ACETAMINOPHEN 5-3: 5-325 | 5 days supply | Qty: 20 | Fill #0

## 2019-04-15 MED FILL — ONDANSETRON HCL 4 MG TABLET: 4 | 3 days supply | Qty: 10 | Fill #0

## 2019-04-15 MED FILL — CYCLOBENZAPRINE HCL 10 MG T: 10 | 8 days supply | Qty: 30 | Fill #0

## 2019-04-15 MED FILL — NAPROXEN 500 MG TABS: 500 | 30 days supply | Qty: 60 | Fill #0

## 2019-04-20 DIAGNOSIS — I890 Lymphedema, not elsewhere classified: Secondary | ICD-10-CM | POA: Diagnosis not present

## 2019-04-20 DIAGNOSIS — M25511 Pain in right shoulder: Secondary | ICD-10-CM | POA: Diagnosis not present

## 2019-04-20 DIAGNOSIS — M75111 Incomplete rotator cuff tear or rupture of right shoulder, not specified as traumatic: Secondary | ICD-10-CM | POA: Diagnosis not present

## 2019-04-20 DIAGNOSIS — Z4889 Encounter for other specified surgical aftercare: Secondary | ICD-10-CM | POA: Diagnosis not present

## 2019-04-21 ENCOUNTER — Encounter: Payer: Self-pay | Admitting: Genetic Counselor

## 2019-04-25 DIAGNOSIS — M25511 Pain in right shoulder: Secondary | ICD-10-CM | POA: Diagnosis not present

## 2019-04-25 MED FILL — LINZESS 290 MCG CAPSULE: 290 | 90 days supply | Qty: 90 | Fill #3

## 2019-04-25 MED FILL — PREMARIN 0.625 MG TABLET: 0.625 | 30 days supply | Qty: 30 | Fill #2

## 2019-04-26 ENCOUNTER — Telehealth: Payer: Self-pay | Admitting: Genetic Counselor

## 2019-04-27 ENCOUNTER — Encounter: Payer: Self-pay | Admitting: Genetic Counselor

## 2019-04-27 ENCOUNTER — Other Ambulatory Visit: Payer: Self-pay | Admitting: Genetic Counselor

## 2019-04-27 ENCOUNTER — Inpatient Hospital Stay: Payer: 59 | Attending: Genetic Counselor | Admitting: Genetic Counselor

## 2019-04-27 ENCOUNTER — Inpatient Hospital Stay: Payer: 59

## 2019-04-27 ENCOUNTER — Other Ambulatory Visit: Payer: Self-pay

## 2019-04-27 DIAGNOSIS — Z800 Family history of malignant neoplasm of digestive organs: Secondary | ICD-10-CM | POA: Diagnosis not present

## 2019-04-27 DIAGNOSIS — D126 Benign neoplasm of colon, unspecified: Secondary | ICD-10-CM

## 2019-04-27 DIAGNOSIS — Z7183 Encounter for nonprocreative genetic counseling: Secondary | ICD-10-CM | POA: Diagnosis not present

## 2019-04-27 DIAGNOSIS — Z803 Family history of malignant neoplasm of breast: Secondary | ICD-10-CM | POA: Diagnosis not present

## 2019-04-27 DIAGNOSIS — Z8371 Family history of colonic polyps: Secondary | ICD-10-CM | POA: Insufficient documentation

## 2019-04-27 DIAGNOSIS — K635 Polyp of colon: Secondary | ICD-10-CM | POA: Diagnosis not present

## 2019-04-27 NOTE — Progress Notes (Addendum)
REFERRING PROVIDER: Mauri Pole, MD [place],  [place] [id number]  PRIMARY PROVIDER:  [name], DO  PRIMARY REASON FOR VISIT:  1. Family history of colon cancer   2. Adenomatous polyp of colon, unspecified part of colon   3. Family history of polyps in the colon      HISTORY OF PRESENT ILLNESS:   I connected with Kara Kennedy on 04/27/2019 at 10:00 AM EDT by Webex video conference and verified that I am speaking with the correct person using two identifiers.   Patient location: Home Provider location: Office   Kara Kennedy, a 63 y.o. female, was seen for a [place] cancer genetics consultation at the request of Dr. Silverio Decamp due to a personal and family history of polyposis, and a family history of colon and breast cancer.  Kara Kennedy presents to clinic today to discuss the possibility of a hereditary predisposition to cancer, genetic testing, and to further clarify her future cancer risks, as well as potential cancer risks for family members.   Kara Kennedy is a 63 y.o. female with no personal history of cancer.  Her brother recently underwent genetic testing for polyposis and was found to have a RAD51D mutation.  CANCER HISTORY:  Oncology History   No history exists.     RISK FACTORS:  Menarche was at age 68.  First live birth at age 6.  Ovaries intact: no.  Hysterectomy: yes.  Menopausal status: postmenopausal.  HRT use: 27 years. Colonoscopy: yes; patient has at least 17 polyps lifetime. Mammogram within the last year: yes. Number of breast biopsies: 1. Up to date with pelvic exams: yes. Any excessive radiation exposure in the past: no  Past Medical History:  Diagnosis Date  . Allergic rhinitis 07/30/2012  . Allergy   . Anxiety 03/06/2014  . Chronic neck pain 09/06/2012  . Colon polyps   . Endometriosis   . Family history of colon cancer 12/29/2016  . Family history of polyps in the colon   . History of pyelonephritis 07/30/2012  . Hypothyroidism  03/06/2014  . IBS (irritable bowel syndrome) 12/29/2016  . Insomnia 08/19/2011  . Pneumonia    hx of 2009   . PONV (postoperative nausea and vomiting)   . Post menopausal syndrome 03/06/2014    Past Surgical History:  Procedure Laterality Date  . ABDOMINAL HYSTERECTOMY    . cervical fusion C5-C7     . CESAREAN SECTION    . DILATION AND CURETTAGE OF UTERUS    . GALLBLADDER SURGERY    . KNEE ARTHROSCOPY WITH MEDIAL MENISECTOMY Left 04/30/2018   Procedure: LEFT KNEE ARTHROSCOPY WITH DEBRIDEMENT;  Surgeon: Mcarthur Rossetti, MD;  Location: [facility];  Service: Orthopedics;  Laterality: Left;  . SPINE SURGERY      Social History   Socioeconomic History  . Marital status: Married    Spouse name: Not on file  . Number of children: Not on file  . Years of education: Not on file  . Highest education level: Not on file  Occupational History  . Not on file  Social Needs  . Financial resource strain: Not on file  . Food insecurity    Worry: Not on file    Inability: Not on file  . Transportation needs    Medical: Not on file    Non-medical: Not on file  Tobacco Use  . Smoking status: Former Research scientist (life sciences)  . Smokeless tobacco: Never Used  . Tobacco comment: Quit 1999  Substance and Sexual  Activity  . Alcohol use: Yes    Alcohol/week: 7.0 standard drinks    Types: 7 Glasses of wine per week    Frequency: Never    Comment: rare  . Drug use: No  . Sexual activity: Yes    Partners: Male    Birth control/protection: Surgical  Lifestyle  . Physical activity    Days per week: Not on file    Minutes per session: Not on file  . Stress: Not on file  Relationships  . Social Herbalist on phone: Not on file    Gets together: Not on file    Attends religious service: Not on file    Active member of club or organization: Not on file    Attends meetings of clubs or organizations: Not on file    Relationship status: Not on file  Other Topics Concern  . Not on file  Social  History Narrative  . Not on file     FAMILY HISTORY:  We obtained a detailed, 4-generation family history.  Significant diagnoses are listed below: Family History  Problem Relation Age of Onset  . Colon cancer Mother 82       d. 21  . Diabetes Father   . Heart disease Father   . Brain cancer Maternal Grandmother 11       d. 56  . Diabetes Paternal Grandfather   . Colon polyps Sister   . Other Brother        RAD51D pos  . Colon polyps Brother   . Colon polyps Brother   . Liver cancer Brother   . Esophageal cancer Neg Hx   . Rectal cancer Neg Hx   . Stomach cancer Neg Hx     The patient has two daughters who are cancer free.  She ha three brothers and two sisters.  Two brothers have had polyposis (one with more than 10 polyps and the other with 50+), and one of these has possible liver cancer.  One of these brothers had genetic testing that found a RAD51D likely pathogenic mutation and a MLH1 VUS.  She has two sisters, one of which has polyposis (over 53 lifetime polyps), and the other has not had colon polyps.  Both parents are deceased.  The patient's mother was diagnosed with colon cancer at 23 and died at 79.  She had four brothers, one who had lung cancer and another who died at 64 from sepsis.  Both maternal grandparents are deceased.  The grandmother had brain cancer.  The patient's father died of heart related issues.  He had two sisters who are cancer free.  One sister had a daughter who had breast cancer in her 49's. His parents are deceased from heart related issues and dementia.  Kara Kennedy is aware of previous family history of genetic testing for hereditary cancer risks. Patient's maternal ancestors are of Caucasian descent, and paternal ancestors are of Caucasian descent. There is no reported Ashkenazi Jewish ancestry. There is no known consanguinity.    GENETIC COUNSELING ASSESSMENT: Kara Kennedy is a 63 y.o. 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 We, therefore, discussed and recommended the following at today's visit.   DISCUSSION: We discussed that many people have colon polyps, with most  people having fewer than 5 in their lifetime.  When an individual develops 10 or more polyps we call this polyposis.  When there is an increased number of colon polyps, the risk for colon cancer increases.  There are several genes associated with polyposis, including APC, MUTYH, RNF43 and others. Her brother underwent genetic testing due to polyposis and a family history of colon cancer and was found to have a RAD51D likely pathogenic mutation and MLH1 VUS.  Neither of these explain the polyposis or colon cancer, but it does convey an increased risk for cancer in the women in the family.  Due to her brother testing positive for the RAD51D mutation, Kara Kennedy has a 50% chance of also having this same mutation.  We discussed that RAD51D increases the risk from about a 1-2% lifetime risk for ovarian cancer up to 12%, and that it also increases the risk for breast cancer, although we do not have good risk figures for this at this time.  Based on the risk for ovarian cancer we recommend removal of ovaries and fallopian tubes.  This was performed on Kara Kennedy when she was around 22.  Therefore she has decreased her risk for ovarian cancer to the greatest extent possible.   Her risk for breast cancer may be increased if she is positive for the RAD51D familial mutation.  However, at this time there are no recommendations over the general population screening for breast cancer.  We would recommend she be followed based on her personal and family history of breast cancer.    We discussed that the RAD51D mutation does not explain the polyposis, and  that there is a small possibility that there are two things running in the family.  The testing lab, [facility], who performed her brother's testing will cover genetic testing for the RAD51D and MLH1 variants identified in her brother at no cost.  However, we recommend sending a full panel for testing to look for polyposis genes.  We will send this to [facility] for their RNA testing.  We reviewed the characteristics, features and inheritance patterns of hereditary cancer syndromes. We also discussed genetic testing, including the appropriate family members to test, the process of testing, insurance coverage and turn-around-time for results. We discussed the implications of a negative, positive, carrier and/or variant of uncertain significant result. We recommended Kara Kennedy pursue genetic testing for the CancerNext-Expanded+RNAinsight gene panel. The CancerNext-Expanded gene panel offered by [facility] and includes sequencing and rearrangement analysis for the following 67 genes: AIP, ALK, APC*, ATM*, BAP1, BARD1, BLM, BMPR1A, BRCA1*, BRCA2*, BRIP1*, CDH1*, CDK4, CDKN1B, CDKN2A, CHEK2*, DICER1, FANCC, FH, FLCN, GALNT12, HOXB13, MAX, MEN1, MET, MLH1*, MRE11A, MSH2*, MSH6*, MUTYH*, NBN, NF1*, NF2, PALB2*, PHOX2B, PMS2*, POLD1, POLE, POT1, PRKAR1A, PTCH1, PTEN*, RAD50, RAD51C*, RAD51D*, RB1, RET, SDHA, SDHAF2, SDHB, SDHC, SDHD, SMAD4, SMARCA4, SMARCB1, SMARCE1, STK11, SUFU, TMEM127, TP53*, TSC1, TSC2, VHL and XRCC2 (sequencing and deletion/duplication); MITF (sequencing only); EPCAM and GREM1 (deletion/duplication only). DNA and RNA analyses performed for * genes.   Based on Kara Kennedy personal and family history of colon polyps and family history of cancer, she meets medical criteria for genetic testing. Despite that she meets criteria, she may still have an out of pocket cost. We discussed that if her out of pocket cost for testing is over $100, the laboratory will call and confirm whether she wants to  proceed with testing.  If the out of pocket cost of testing is less than $100 she will be billed  by the genetic testing laboratory.   PLAN: After considering the risks, benefits, and limitations, Kara Kennedy provided informed consent to pursue genetic testing and the blood sample was sent to [facility] for analysis of the CancerNext-Expanded+RNAinsight. Results should be available within approximately 2-3 weeks' time, at which point they will be disclosed by telephone to Kara Kennedy, as will any additional recommendations warranted by these results. Kara Kennedy will receive a summary of her genetic counseling visit and a copy of her results once available. This information will also be available in Epic.   Lastly, we encouraged Kara Kennedy to remain in contact with cancer genetics annually so that we can continuously update the family history and inform her of any changes in cancer genetics and testing that may be of benefit for this family.   Kara Kennedy questions were answered to her satisfaction today. Our contact information was provided should additional questions or concerns arise. Thank you for the referral and allowing [place] to share in the care of your patient.   Caileigh Canche P. Florene Glen, [place], [facility] Licensed, Insurance risk surveyor [email or web address] phone: [phone]  The patient was seen for a total of 345 minutes in face-to-face genetic counseling.  This patient was discussed with Drs. Magrinat, Lindi Adie and/or Burr Medico who agrees with the above.    _______________________________________________________________________ For Office Staff:  Number of people involved in session: 1 Was an Intern/ student involved with case: no

## 2019-04-28 ENCOUNTER — Other Ambulatory Visit: Payer: Self-pay | Admitting: Family Medicine

## 2019-04-28 DIAGNOSIS — F5102 Adjustment insomnia: Secondary | ICD-10-CM

## 2019-04-28 DIAGNOSIS — J302 Other seasonal allergic rhinitis: Secondary | ICD-10-CM

## 2019-04-29 MED FILL — GABAPENTIN 300 MG CAPSULE: 300 | 30 days supply | Qty: 90 | Fill #0

## 2019-04-29 MED FILL — traZODone HCL 50 MG TABS: 50 | 90 days supply | Qty: 90 | Fill #0

## 2019-04-29 MED FILL — MONTELUKAST SOD 10 MG TAB: 10 | 90 days supply | Qty: 90 | Fill #0

## 2019-04-29 NOTE — Telephone Encounter (Signed)
Last fill for Gabapentin 02/22/19  #90/1 Last fill for Trazodone 11/02/18  #90/1 Last OV 11/02/18 Next OV 05/10/19 Ok to fill?

## 2019-05-03 MED FILL — CYCLOBENZAPRINE HCL 10 MG T: 10 | 10 days supply | Qty: 30 | Fill #0

## 2019-05-04 DIAGNOSIS — M25511 Pain in right shoulder: Secondary | ICD-10-CM | POA: Diagnosis not present

## 2019-05-05 ENCOUNTER — Encounter (INDEPENDENT_AMBULATORY_CARE_PROVIDER_SITE_OTHER): Payer: Self-pay | Admitting: Physical Medicine and Rehabilitation

## 2019-05-05 DIAGNOSIS — I890 Lymphedema, not elsewhere classified: Secondary | ICD-10-CM | POA: Diagnosis not present

## 2019-05-05 DIAGNOSIS — Z4889 Encounter for other specified surgical aftercare: Secondary | ICD-10-CM | POA: Diagnosis not present

## 2019-05-05 DIAGNOSIS — M75111 Incomplete rotator cuff tear or rupture of right shoulder, not specified as traumatic: Secondary | ICD-10-CM | POA: Diagnosis not present

## 2019-05-05 DIAGNOSIS — M25511 Pain in right shoulder: Secondary | ICD-10-CM | POA: Diagnosis not present

## 2019-05-10 ENCOUNTER — Other Ambulatory Visit: Payer: Self-pay

## 2019-05-10 ENCOUNTER — Encounter: Payer: Self-pay | Admitting: Family Medicine

## 2019-05-10 ENCOUNTER — Ambulatory Visit (INDEPENDENT_AMBULATORY_CARE_PROVIDER_SITE_OTHER): Payer: 59 | Admitting: Family Medicine

## 2019-05-10 VITALS — BP 136/78 | HR 79 | Temp 98.6°F | Ht 64.0 in | Wt 140.4 lb

## 2019-05-10 DIAGNOSIS — M546 Pain in thoracic spine: Secondary | ICD-10-CM

## 2019-05-10 DIAGNOSIS — E559 Vitamin D deficiency, unspecified: Secondary | ICD-10-CM

## 2019-05-10 DIAGNOSIS — E039 Hypothyroidism, unspecified: Secondary | ICD-10-CM

## 2019-05-10 DIAGNOSIS — K5909 Other constipation: Secondary | ICD-10-CM

## 2019-05-10 DIAGNOSIS — Z79899 Other long term (current) drug therapy: Secondary | ICD-10-CM

## 2019-05-10 DIAGNOSIS — F902 Attention-deficit hyperactivity disorder, combined type: Secondary | ICD-10-CM | POA: Diagnosis not present

## 2019-05-10 DIAGNOSIS — J302 Other seasonal allergic rhinitis: Secondary | ICD-10-CM

## 2019-05-10 DIAGNOSIS — Z13220 Encounter for screening for lipoid disorders: Secondary | ICD-10-CM | POA: Diagnosis not present

## 2019-05-10 DIAGNOSIS — N951 Menopausal and female climacteric states: Secondary | ICD-10-CM | POA: Diagnosis not present

## 2019-05-10 DIAGNOSIS — Z0000 Encounter for general adult medical examination without abnormal findings: Secondary | ICD-10-CM

## 2019-05-10 DIAGNOSIS — F5102 Adjustment insomnia: Secondary | ICD-10-CM

## 2019-05-10 DIAGNOSIS — Z1231 Encounter for screening mammogram for malignant neoplasm of breast: Secondary | ICD-10-CM

## 2019-05-10 DIAGNOSIS — G8929 Other chronic pain: Secondary | ICD-10-CM

## 2019-05-10 DIAGNOSIS — Z23 Encounter for immunization: Secondary | ICD-10-CM | POA: Diagnosis not present

## 2019-05-10 DIAGNOSIS — M47812 Spondylosis without myelopathy or radiculopathy, cervical region: Secondary | ICD-10-CM

## 2019-05-10 LAB — TSH: TSH: 1.4 u[IU]/mL (ref 0.35–4.50)

## 2019-05-10 LAB — CBC WITH DIFFERENTIAL/PLATELET
Basophils Absolute: 0.1 10*3/uL (ref 0.0–0.1)
Basophils Relative: 1.9 % (ref 0.0–3.0)
Eosinophils Absolute: 0.5 10*3/uL (ref 0.0–0.7)
Eosinophils Relative: 7.5 % — ABNORMAL HIGH (ref 0.0–5.0)
HCT: 40.2 % (ref 36.0–46.0)
Hemoglobin: 13.4 g/dL (ref 12.0–15.0)
Lymphocytes Relative: 26.7 % (ref 12.0–46.0)
Lymphs Abs: 1.7 10*3/uL (ref 0.7–4.0)
MCHC: 33.4 g/dL (ref 30.0–36.0)
MCV: 89.5 fl (ref 78.0–100.0)
Monocytes Absolute: 0.3 10*3/uL (ref 0.1–1.0)
Monocytes Relative: 5.1 % (ref 3.0–12.0)
Neutro Abs: 3.7 10*3/uL (ref 1.4–7.7)
Neutrophils Relative %: 58.8 % (ref 43.0–77.0)
Platelets: 376 10*3/uL (ref 150.0–400.0)
RBC: 4.49 Mil/uL (ref 3.87–5.11)
RDW: 13.7 % (ref 11.5–15.5)
WBC: 6.4 10*3/uL (ref 4.0–10.5)

## 2019-05-10 LAB — COMPREHENSIVE METABOLIC PANEL
ALT: 37 U/L — ABNORMAL HIGH (ref 0–35)
AST: 30 U/L (ref 0–37)
Albumin: 4.4 g/dL (ref 3.5–5.2)
Alkaline Phosphatase: 128 U/L — ABNORMAL HIGH (ref 39–117)
BUN: 14 mg/dL (ref 6–23)
CO2: 28 mEq/L (ref 19–32)
Calcium: 10 mg/dL (ref 8.4–10.5)
Chloride: 101 mEq/L (ref 96–112)
Creatinine, Ser: 0.7 mg/dL (ref 0.40–1.20)
GFR: 84.22 mL/min (ref >60.00)
Glucose, Bld: 88 mg/dL (ref 70–99)
Potassium: 5 mEq/L (ref 3.5–5.1)
Sodium: 138 mEq/L (ref 135–145)
Total Bilirubin: 0.3 mg/dL (ref 0.2–1.2)
Total Protein: 6.7 g/dL (ref 6.0–8.3)

## 2019-05-10 LAB — LIPID PANEL
Cholesterol: 195 mg/dL (ref 0–200)
HDL: 82.4 mg/dL (ref >39.00)
LDL Cholesterol: 81 mg/dL (ref 0–99)
NonHDL: 112.75
Total CHOL/HDL Ratio: 2
Triglycerides: 159 mg/dL — ABNORMAL HIGH (ref 0.0–149.0)
VLDL: 31.8 mg/dL (ref 0.0–40.0)

## 2019-05-10 LAB — VITAMIN B12: Vitamin B-12: 303 pg/mL (ref 211–911)

## 2019-05-10 LAB — VITAMIN D 25 HYDROXY (VIT D DEFICIENCY, FRACTURES): VITD: 58.17 ng/mL (ref 30.00–100.00)

## 2019-05-10 LAB — MAGNESIUM: Magnesium: 1.9 mg/dL (ref 1.5–2.5)

## 2019-05-10 MED ORDER — LISDEXAMFETAMINE DIMESYLATE 40 MG PO CAPS: 40.0000 mg | ORAL_CAPSULE | Freq: Every day | ORAL | 0 refills | Status: DC

## 2019-05-10 MED ORDER — AMPHETAMINE-DEXTROAMPHETAMINE 10 MG PO TABS: 10.0000 mg | ORAL_TABLET | Freq: Every day | ORAL | 0 refills | Status: DC

## 2019-05-10 MED ORDER — MONTELUKAST SODIUM 10 MG PO TABS: 10.0000 mg | ORAL_TABLET | Freq: Every day | ORAL | 3 refills | Status: DC

## 2019-05-10 MED ORDER — LEVOTHYROXINE SODIUM 88 MCG PO TABS: 88.0000 ug | ORAL_TABLET | Freq: Every morning | ORAL | 3 refills | Status: DC

## 2019-05-10 MED ORDER — GABAPENTIN 300 MG PO CAPS: 900.0000 mg | ORAL_CAPSULE | Freq: Every day | ORAL | 2 refills | Status: DC

## 2019-05-10 MED ORDER — PREMARIN 0.625 MG PO TABS: 0.6250 mg | ORAL_TABLET | Freq: Every day | ORAL | 1 refills | Status: DC

## 2019-05-10 MED ORDER — TRAZODONE HCL 50 MG PO TABS: ORAL_TABLET | ORAL | 2 refills | Status: DC

## 2019-05-10 MED ORDER — AZELASTINE HCL 0.1 % NA SOLN: 2.0000 | Freq: Two times a day (BID) | NASAL | 12 refills | Status: DC

## 2019-05-10 MED ORDER — LINACLOTIDE 290 MCG PO CAPS: ORAL_CAPSULE | ORAL | 3 refills | Status: DC

## 2019-05-10 MED FILL — AZELASTINE HCL 137 MCG SPRY: 0.1 | 25 days supply | Qty: 30 | Fill #0

## 2019-05-10 MED FILL — VYVANSE 40 MG CAPSULE: 40 | 30 days supply | Qty: 30 | Fill #0

## 2019-05-10 MED FILL — AMPHETAMINE-DEXTROAMPHETAMI: 10 | 90 days supply | Qty: 90 | Fill #0

## 2019-05-10 NOTE — Progress Notes (Signed)
Subjective:    Kara Kennedy is a 63 y.o. female and is here for a comprehensive physical exam. Doing well. S/P right shoulder surgery. Improving. Moving to [place] soon to work in [employer]. Worried that Vyvanse not adequate dose. Some afternoon focus issues. Mammogram scheduled today. Flu shot today.  Health Maintenance Due  Topic Date Due  . MAMMOGRAM  02/17/2019  . INFLUENZA VACCINE  02/26/2019   Current Outpatient Medications:  .  azelastine (ASTELIN) 0.1 % nasal spray, Place 2 sprays into both nostrils 2 (two) times daily. Use in each nostril as directed, Disp: 30 mL, Rfl: 12 .  docusate sodium (COLACE) 100 MG capsule, Take 100 mg by mouth daily. , Disp: , Rfl:  .  fluticasone (FLONASE) 50 MCG/ACT nasal spray, Place into both nostrils daily., Disp: , Rfl:  .  gabapentin (NEURONTIN) 300 MG capsule, Take 3 capsules (900 mg total) by mouth at bedtime., Disp: 270 capsule, Rfl: 2 .  levothyroxine (SYNTHROID) 88 MCG tablet, Take 1 tablet (88 mcg total) by mouth every morning., Disp: 90 tablet, Rfl: 3 .  linaclotide (LINZESS) 290 MCG CAPS capsule, TAKE 1 CAPSULE BY MOUTH ONCE DAILY BEFORE BREAKFAST, Disp: 90 capsule, Rfl: 3 .  loratadine (CLARITIN) 10 MG tablet, Take 10 mg by mouth daily., Disp: , Rfl:  .  montelukast (SINGULAIR) 10 MG tablet, Take 1 tablet (10 mg total) by mouth at bedtime., Disp: 90 tablet, Rfl: 3 .  PREMARIN 0.625 MG tablet, Take 1 tablet (0.625 mg total) by mouth daily., Disp: 90 tablet, Rfl: 1 .  traZODone (DESYREL) 50 MG tablet, TAKE 1/2-1 TABLETS (25-50 MG TOTAL) BY MOUTH AT BEDTIME AS NEEDED FOR SLEEP., Disp: 90 tablet, Rfl: 2 .  cholecalciferol (VITAMIN D) 1000 units tablet, Take 1,000 Units by mouth daily., Disp: , Rfl:  .  lisdexamfetamine (VYVANSE) 30 MG capsule, Take 1 capsule (30 mg total) by mouth daily before breakfast., Disp: 30 capsule, Rfl: 0  PMHx, SurgHx, SocialHx, Medications, and Allergies were reviewed in the Visit Navigator and updated as  appropriate.   Past Medical History:  Diagnosis Date  . Allergic rhinitis 07/30/2012  . Allergy   . Anxiety 03/06/2014  . Chronic neck pain 09/06/2012  . Colon polyps   . Endometriosis   . Family history of colon cancer 12/29/2016  . Family history of polyps in the colon   . History of pyelonephritis 07/30/2012  . Hypothyroidism 03/06/2014  . IBS (irritable bowel syndrome) 12/29/2016  . Insomnia 08/19/2011  . Pneumonia    hx of 2009   . PONV (postoperative nausea and vomiting)   . Post menopausal syndrome 03/06/2014     Past Surgical History:  Procedure Laterality Date  . ABDOMINAL HYSTERECTOMY    . cervical fusion C5-C7     . CESAREAN SECTION    . DILATION AND CURETTAGE OF UTERUS    . GALLBLADDER SURGERY    . KNEE ARTHROSCOPY WITH MEDIAL MENISECTOMY Left 04/30/2018   Procedure: LEFT KNEE ARTHROSCOPY WITH DEBRIDEMENT;  Surgeon: Mcarthur Rossetti, MD;  Location: [facility];  Service: Orthopedics;  Laterality: Left;  . SPINE SURGERY       Family History  Problem Relation Age of Onset  . Colon cancer Mother 99       d. 57  . Diabetes Father   . Heart disease Father   . Brain cancer Maternal Grandmother 39       d. 18  . Diabetes Paternal Grandfather   . Colon polyps Sister   .  Other Brother        RAD51D pos  . Colon polyps Brother   . Colon polyps Brother   . Liver cancer Brother   . Esophageal cancer Neg Hx   . Rectal cancer Neg Hx   . Stomach cancer Neg Hx     Social History   Tobacco Use  . Smoking status: Former Research scientist (life sciences)  . Smokeless tobacco: Never Used  . Tobacco comment: Quit 1999  Substance Use Topics  . Alcohol use: Yes    Alcohol/week: 7.0 standard drinks    Types: 7 Glasses of wine per week    Frequency: Never    Comment: rare  . Drug use: No    Review of Systems:   Pertinent items are noted in the HPI. Otherwise, ROS is negative.  Objective:   BP 136/78   Pulse 79   Temp 98.6 F (37 C) (Temporal)   Ht '5\' 4"'  (1.626 m)   Wt 140 lb 6.4 oz  (63.7 kg)   LMP  (LMP Unknown)   SpO2 97%   BMI 24.10 kg/m   General appearance: alert, cooperative and appears stated age. Head: normocephalic, without obvious abnormality, atraumatic. Neck: no adenopathy, supple, symmetrical, trachea midline; thyroid not enlarged, symmetric, no tenderness/mass/nodules. Lungs: clear to auscultation bilaterally. Heart: regular rate and rhythm Abdomen: soft, non-tender; no masses,  no organomegaly. Extremities: extremities normal, atraumatic, no cyanosis or edema. Skin: right shoulder in sling. Lymph: cervical, supraclavicular, and axillary nodes normal; no abnormal inguinal nodes palpated. Neurologic: grossly normal.                           Assessment/Plan:   Kara Kennedy was seen today for follow-up.  Diagnoses and all orders for this visit:  Routine physical examination  Visit for screening mammogram -     Cancel: MM DIGITAL SCREENING BILATERAL; Future -     MM 3D SCREEN BREAST BILATERAL; Future  Seasonal allergies -     azelastine (ASTELIN) 0.1 % nasal spray; Place 2 sprays into both nostrils 2 (two) times daily. Use in each nostril as directed -     montelukast (SINGULAIR) 10 MG tablet; Take 1 tablet (10 mg total) by mouth at bedtime.  Adjustment insomnia -     traZODone (DESYREL) 50 MG tablet; TAKE 1/2-1 TABLETS (25-50 MG TOTAL) BY MOUTH AT BEDTIME AS NEEDED FOR SLEEP.  Need for immunization against influenza -     Flu Vaccine QUAD 36+ mos IM  Acquired hypothyroidism -     levothyroxine (SYNTHROID) 88 MCG tablet; Take 1 tablet (88 mcg total) by mouth every morning. -     TSH  Vitamin D deficiency -     VITAMIN D 25 Hydroxy (Vit-D Deficiency, Fractures)  Attention deficit hyperactivity disorder (ADHD), combined type -     lisdexamfetamine (VYVANSE) 40 MG capsule; Take 1 capsule (40 mg total) by mouth daily before breakfast. -     lisdexamfetamine (VYVANSE) 40 MG capsule; Take 1 capsule (40 mg total) by mouth daily before breakfast. -      lisdexamfetamine (VYVANSE) 40 MG capsule; Take 1 capsule (40 mg total) by mouth daily before breakfast. -     amphetamine-dextroamphetamine (ADDERALL) 10 MG tablet; Take 1 tablet (10 mg total) by mouth daily after lunch.  Medication management -     CBC with Differential/Platelet -     Comprehensive metabolic panel -     Magnesium -     Vitamin B12  Post menopausal syndrome -     PREMARIN 0.625 MG tablet; Take 1 tablet (0.625 mg total) by mouth daily.  Screening for lipid disorders -     Lipid panel  Chronic constipation -     linaclotide (LINZESS) 290 MCG CAPS capsule; TAKE 1 CAPSULE BY MOUTH ONCE DAILY BEFORE BREAKFAST  Cervical spondylosis -     gabapentin (NEURONTIN) 300 MG capsule; Take 3 capsules (900 mg total) by mouth at bedtime.  Chronic midline thoracic back pain -     gabapentin (NEURONTIN) 300 MG capsule; Take 3 capsules (900 mg total) by mouth at bedtime.   Patient Counseling: '[x]'    Nutrition: Stressed importance of moderation in sodium/caffeine intake, saturated fat and cholesterol, caloric balance, sufficient intake of fresh fruits, vegetables, fiber, calcium, iron, and 1 mg of folate supplement per day (for females capable of pregnancy).  '[x]'    Stressed the importance of regular exercise.   '[x]'    Substance Abuse: Discussed cessation/primary prevention of tobacco, alcohol, or other drug use; driving or other dangerous activities under the influence; availability of treatment for abuse.   '[x]'    Injury prevention: Discussed safety belts, safety helmets, smoke detector, smoking near bedding or upholstery.   '[x]'    Sexuality: Discussed sexually transmitted diseases, partner selection, use of condoms, avoidance of unintended pregnancy  and contraceptive alternatives.  '[x]'    Dental health: Discussed importance of regular tooth brushing, flossing, and dental visits.  '[x]'    Health maintenance and immunizations reviewed. Please refer to Health maintenance section.   [name], DO [place]

## 2019-05-10 NOTE — Patient Instructions (Addendum)
You have an appointment scheduled for: []   2D Mammogram  [x]   3D Mammogram  []   Bone Density   On 06/27/2019           At 2:10  Your appointment will at the following location  [x]   [facility]      [place], [place]         []   [facility]  [place], [place]   Make sure to wear two peace clothing.  No lotions powders or deodorants the day of the appointment. Make sure to bring picture ID and insurance card.  Bring list of medications you are currently taking including any supplements.

## 2019-05-11 DIAGNOSIS — M25511 Pain in right shoulder: Secondary | ICD-10-CM | POA: Diagnosis not present

## 2019-05-12 ENCOUNTER — Encounter: Payer: Self-pay | Admitting: Family Medicine

## 2019-05-13 ENCOUNTER — Other Ambulatory Visit: Payer: Self-pay

## 2019-05-13 ENCOUNTER — Encounter: Payer: Self-pay | Admitting: Family Medicine

## 2019-05-13 DIAGNOSIS — R7989 Other specified abnormal findings of blood chemistry: Secondary | ICD-10-CM

## 2019-05-13 MED FILL — NAPROXEN 500 MG TABS: 500 | 30 days supply | Qty: 60 | Fill #1

## 2019-05-13 NOTE — Telephone Encounter (Signed)
Called patient let her know I have called in to pharmacy

## 2019-05-13 NOTE — Telephone Encounter (Signed)
Will get ordered and call with app.

## 2019-05-16 MED FILL — SYNTHROID 88 MCG TABLET: 88 | 90 days supply | Qty: 90 | Fill #0

## 2019-05-18 DIAGNOSIS — M25511 Pain in right shoulder: Secondary | ICD-10-CM | POA: Diagnosis not present

## 2019-05-20 ENCOUNTER — Encounter: Payer: Self-pay | Admitting: Genetic Counselor

## 2019-05-20 ENCOUNTER — Telehealth: Payer: Self-pay | Admitting: Genetic Counselor

## 2019-05-20 ENCOUNTER — Ambulatory Visit: Payer: Self-pay | Admitting: Genetic Counselor

## 2019-05-20 DIAGNOSIS — Z1379 Encounter for other screening for genetic and chromosomal anomalies: Secondary | ICD-10-CM | POA: Insufficient documentation

## 2019-05-20 DIAGNOSIS — D126 Benign neoplasm of colon, unspecified: Secondary | ICD-10-CM

## 2019-05-20 NOTE — Telephone Encounter (Signed)
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  It did not find the VUS in MLH1 that was also found in her brother.  We discussed that this does NOT explain her polyposis, but does place her at an increased risk for ovarian cancer.

## 2019-05-20 NOTE — Progress Notes (Addendum)
REFERRING PROVIDER: Mauri Pole, MD [place],  [place] [address]  PRIMARY PROVIDER:  [name], DO  PRIMARY REASON FOR VISIT:  1. Genetic testing   2. Adenomatous polyp of colon, unspecified part of colon     HPI:  Kara Kennedy was previously seen in the [facility] due to personal history of polyposis, a family history of a known RAD51D likely pathogenic mutation, and a family of cancer and concerns regarding a hereditary predisposition to cancer. Please refer to our prior cancer genetics clinic note for more information regarding Kara Kennedy's medical, social and family histories, and our assessment and recommendations, at the time. Kara Kennedy recent genetic test results were disclosed to her, as were recommendations warranted by these results. These results and recommendations are discussed in more detail below.  CANCER HISTORY:  Oncology History   No history exists.    FAMILY HISTORY:  We obtained a detailed, 4-generation family history.  Significant diagnoses are listed below: Family History  Problem Relation Age of Onset   Colon cancer Mother 22       d. 48   Diabetes Father    Heart disease Father    Brain cancer Maternal Grandmother 73       d. 36   Diabetes Paternal Grandfather    Colon polyps Sister    Other Brother        RAD51D pos   Colon polyps Brother    Colon polyps Brother    Liver cancer Brother    Esophageal cancer Neg Hx    Rectal cancer Neg Hx    Stomach cancer Neg Hx     The patient has two daughters who are cancer free.  She ha three brothers and two sisters.  Two brothers have had polyposis (one with more than 10 polyps and the other with 50+), and one of these has possible liver cancer.  One of these brothers had genetic testing that found a RAD51D likely pathogenic mutation and a MLH1 VUS.  She has two sisters, one of which has polyposis (over 47 lifetime polyps), and the other has not had  colon polyps.  Both parents are deceased.  The patient's mother was diagnosed with colon cancer at 71 and died at 82.  She had four brothers, one who had lung cancer and another who died at 36 from sepsis.  Both maternal grandparents are deceased.  The grandmother had brain cancer.  The patient's father died of heart related issues.  He had two sisters who are cancer free.  One sister had a daughter who had breast cancer in her 33's. His parents are deceased from heart related issues and dementia.  Kara Kennedy is aware of previous family history of genetic testing for hereditary cancer risks. Patient's maternal ancestors are of Caucasian descent, and paternal ancestors are of Caucasian descent. There is no reported Ashkenazi Jewish ancestry. There is no known consanguinity.   GENETIC TESTING:  At the time of Kara Kennedy visit, we recommended she pursue genetic testing of the CancerNext-Expanded+RNAinsight test. 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 There were no deleterious mutations in AIP, ALK, APC*, ATM*, BAP1, BARD1, BLM, BMPR1A, BRCA1*, BRCA2*, BRIP1*, CDH1*, CDK4, CDKN1B, CDKN2A, CHEK2*, DICER1, FANCC, FH, FLCN, GALNT12, HOXB13, MAX, MEN1, MET, MLH1*, MRE11A, MSH2*, MSH6*, MUTYH*, NBN, NF1*, NF2, PALB2*, PHOX2B, PMS2*, POLD1, POLE, POT1, PRKAR1A,  PTCH1, PTEN*, RAD50, RAD51C*, RB1, RET, SDHA, SDHAF2, SDHB, SDHC, SDHD, SMAD4, SMARCA4, SMARCB1, SMARCE1, STK11, SUFU, TMEM127, TP53*, TSC1, TSC2, VHL and XRCC2 (sequencing and deletion/duplication); MITF (sequencing only); EPCAM and GREM1 (deletion/duplication only). DNA and RNA analyses performed for * genes.    DISCUSSION:  We explained that her brother's testing not only identified the Likely Pathogenic variant in RAD51D, but also a VUS in [place].  The VUS in MLH1 was not  found in Kara Kennedy test.  Her personal history of polyposis is not explained by this genetic testing.  We discussed with Kara Kennedy that because current genetic testing is not perfect, it is possible there may be a gene mutation in one of these genes that current testing cannot detect, but that chance is small.  We also discussed, that there could be another gene that has not yet been discovered, or that we have not yet tested, that is responsible for the cancer diagnoses in the family. It is also possible there is a hereditary cause for the cancer in the family that Kara Kennedy did not inherit and therefore was not identified in her testing.  Therefore, it is important to remain in touch with cancer genetics in the future so that we can continue to offer Kara Kennedy the most up to date genetic testing.   CLINICAL CONDITION Women who are carriers of a single pathogenic RAD51D variant have an increased risk of ovarian cancer. Studies suggest this risk is 10-12% (PMID: 88677373, 66815947, 07615183, 43735789, 78478412, 82081388, 71959747, 18550158).  There is also preliminary evidence of an association between RAD51D and triple negative breast cancer (PMID: 68257493, 55217471, 59539672) and prostate cancer (PMID: 89791504, 13643837). At this time, there is not enough evidence to change medical management for breast cancer screening. This uncertainty may be resolved as new information becomes available. Individuals with a pathogenic variant in RAD51D will not necessarily develop cancer in their lifetime, however, their risk of cancer is increased over that of the general population.  GENE INFORMATION: The RAD51D gene is essential to DNA repair. It is involved in the homologous recombination repair pathway of double-stranded DNA breaks arising during DNA replication or induced by DNA-damaging agents (UniProtKB - R93968 (RA51D_HUMAN). Accessed January 2017). If there is a pathogenic variant in this gene that prevents  it from functioning normally, the risk of developing certain types of cancers may be increased.  MANAGEMENT: The [employer]  (NCCN) recommends consideration of prophylactic salpingo-oophorectomy (surgical removal of the ovaries and fallopian tubes) for women with a pathogenic variant in RAD51D after childbearing is complete. The current evidence is insufficient to make a firm recommendation as to the optimal age for this procedure. However, based on the current, limited evidence, a discussion about surgery should be held around 78-31 years of age or earlier based on a specific family history of early-onset ovarian cancer (NCCN Genetic/Familial High-Risk Assessment: Breast and Ovarian. Version 1.2020). Women electing to defer prophylactic oophorectomy can consider screening for serum CA-125 and transvaginal ultrasound; however, data do not support such screening and it should not be a substitute for preventive surgery Naval architect. Genetic/Familial High-Risk Assessment: Breast and Ovarian. Version 1.2020.  The current NCCN guidelines do not recommend additional breast cancer screening for individuals with a single pathogenic RAD51D variant beyond what is recommended for the general population. However, they caution that cancer screening should ultimately be guided by personal and family history (NCCN. Genetic/Familial High-Risk Assessment: Breast and Ovarian. Version 1.2020).  An individuals cancer  risk and medical management are not determined by genetic test results alone. Overall cancer risk assessment incorporates additional factors, including personal medical history, family history, and any available genetic information that may result in a personalized plan for cancer prevention and surveillance.  INHERITANCE: Hereditary predisposition to cancer due to a single pathogenic variant in the RAD51D gene has autosomal dominant inheritance. This means  that an individual with a pathogenic variant has a 50% chance of passing the condition on to their offspring. With this result, it is now possible to identify at-risk relatives who can pursue testing for this specific familial variant. Many cases are inherited from a parent, but some cases can occur spontaneously (i.e., an individual with a pathogenic variant has parents who do not have it).  FAMILY MEMBERS; Even though data regarding pathogenic variants in RAD51D is still emerging, knowing if such a variant is present is advantageous. At-risk relatives can be identified, enabling pursuit of a diagnostic evaluation. Further, the available information regarding hereditary cancer susceptibility genes is constantly evolving and more clinically relevant data regarding RAD51D are likely to become available in the near future. Awareness of this cancer predisposition encourages patients and their providers to inform at-risk family members, to diligently follow recommended screening protocols, and to be vigilant in maintaining close and regular contact with their local genetics clinic in anticipation of new information.  It is important that all of Kara Kennedy relatives (both men and women) know of the presence of this gene mutation. Site-specific genetic testing can sort out who in the family is at risk and who is not.   Kara Kennedy daughters and siblings have a 50% chance to have inherited this mutation. We recommend they have genetic testing for this same mutation, as identifying the presence of this mutation would allow them to also take advantage of risk-reducing measures.   SUPPORT AND RESOURCES:  We discussed that the RAD51D gene mutation may increase the risk for triple negative breast cancer.  However, it does not increase her risk to a point that is high enough to warrant increased breast screening or prophylactic surgery.  In order to manage her risk, we have made a referral to the high risk breast  clinic.  She was agreeable to this referral.  If Kara Kennedy is interested in information and support, there are two groups, Facing Our Risk (www.facingourrisk.com) and Bright Pink (www.brightpink.org) which some people have found useful when identified with a hereditary risk for breast and ovarian cancer. They provide opportunities to speak with other individuals from high-risk families. To locate genetic counselors in other cities, visit the website of the [employer] (ArtistMovie.se) and Secretary/administrator for a Social worker by zip code.  We encouraged Kara Kennedy to remain in contact with [place] on an annual basis so we can update her personal and family histories, and let her know of advances in cancer genetics that may benefit the family. Our contact number was provided. Kara Kennedy questions were answered to her satisfaction today, and she knows she is welcome to call anytime with additional questions.   Roma Kayser, [place], [facility]  Licensed, Certified Genetic Counselor [email or web address] phone: [phone]

## 2019-05-22 ENCOUNTER — Encounter (INDEPENDENT_AMBULATORY_CARE_PROVIDER_SITE_OTHER): Payer: Self-pay | Admitting: Physical Medicine and Rehabilitation

## 2019-05-24 ENCOUNTER — Telehealth: Payer: Self-pay | Admitting: Genetic Counselor

## 2019-05-24 NOTE — Telephone Encounter (Signed)
I cld and lft Kara Kennedy a voicemail to schedule an appt for the high risk breast clinic.

## 2019-05-24 NOTE — Telephone Encounter (Signed)
-----  Message from Clarene Essex, Counselor sent at 05/20/2019 11:09 AM EDT ----- Patient is positive for a RAD51D mutation.  This increases her risk for ovarian cancer, and a slight increased risk for TN breast cancer.  She has had a TAH/BSO.  It does NOT explain her polyposis.  We are referring her to the high risk breast clinic.    Seth Bake, can you please call the patient and schedule.  Drs. Magrinat and Lindi Adie and Mendel Ryder, this is FYI.

## 2019-05-26 ENCOUNTER — Encounter (INDEPENDENT_AMBULATORY_CARE_PROVIDER_SITE_OTHER): Payer: Self-pay | Admitting: Family Medicine

## 2019-05-26 NOTE — Telephone Encounter (Signed)
Please review

## 2019-05-27 DIAGNOSIS — M19011 Primary osteoarthritis, right shoulder: Secondary | ICD-10-CM | POA: Diagnosis not present

## 2019-05-27 DIAGNOSIS — M75101 Unspecified rotator cuff tear or rupture of right shoulder, not specified as traumatic: Secondary | ICD-10-CM | POA: Diagnosis not present

## 2019-05-27 DIAGNOSIS — Z9889 Other specified postprocedural states: Secondary | ICD-10-CM | POA: Diagnosis not present

## 2019-05-27 DIAGNOSIS — M25511 Pain in right shoulder: Secondary | ICD-10-CM | POA: Diagnosis not present

## 2019-06-01 MED FILL — GABAPENTIN 300 MG CAPSULE: 300 | 30 days supply | Qty: 90 | Fill #1

## 2019-06-03 ENCOUNTER — Telehealth: Payer: Self-pay | Admitting: Genetic Counselor

## 2019-06-03 NOTE — Telephone Encounter (Signed)
Thank you.  ° °Kara Kennedy  °

## 2019-06-03 NOTE — Telephone Encounter (Signed)
-----  Message from Clarene Essex, Counselor sent at 06/01/2019  8:11 AM EST ----- Pauletta Browns,  I just wanted to check in on this and see if she has been called, I don't see that she has been scheduled.  Santiago Glad ----- Message ----- From: Chauncey Cruel, MD Sent: 05/30/2019   7:39 AM EST To: Clarene Essex, Counselor, #  Salley Scarlet Seth Bake! I don't find this patient has been scheduled yet. Can you follow up?  Thanks!  GM ----- Message ----- From: Clarene Essex, Counselor Sent: 05/20/2019  11:09 AM EST To: Chauncey Cruel, MD, #  Patient is positive for a RAD51D mutation.  This increases her risk for ovarian cancer, and a slight increased risk for TN breast cancer.  She has had a TAH/BSO.  It does NOT explain her polyposis.  We are referring her to the high risk breast clinic.    Seth Bake, can you please call the patient and schedule.  Drs. Magrinat and Lindi Adie and Mendel Ryder, this is FYI.

## 2019-06-03 NOTE — Telephone Encounter (Signed)
Cld and lft Ms. Harpold a vm for her to be scheduled for the high risk breast clinic.

## 2019-06-07 MED FILL — PREMARIN 0.625 MG TABLET: 0.625 | 90 days supply | Qty: 90 | Fill #0

## 2019-06-09 MED FILL — VYVANSE 40 MG CAPSULE: 40 | 30 days supply | Qty: 30 | Fill #0

## 2019-06-13 ENCOUNTER — Ambulatory Visit
Admission: RE | Admit: 2019-06-13 | Discharge: 2019-06-13 | Disposition: A | Discharge status: Home / Self Care | Payer: 59 | Source: Ambulatory Visit | Attending: Family Medicine | Admitting: Family Medicine

## 2019-06-13 DIAGNOSIS — R7989 Other specified abnormal findings of blood chemistry: Secondary | ICD-10-CM | POA: Diagnosis not present

## 2019-06-15 ENCOUNTER — Telehealth: Payer: Self-pay | Admitting: Adult Health

## 2019-06-15 MED FILL — NAPROXEN 500 MG TABS: 500 | 30 days supply | Qty: 60 | Fill #0

## 2019-06-15 NOTE — Progress Notes (Signed)
Please inform patient of the following:  Ok to let her know her ultrasound was NORMAL. IF has further questions recommend OV.  Algis Greenhouse. Jerline Pain, MD 06/15/2019 11:49 AM

## 2019-06-15 NOTE — Telephone Encounter (Signed)
Called patient and LMOM to call back so we can talk about her referral to high risk clinic.  Wilber Bihari, NP

## 2019-06-27 ENCOUNTER — Ambulatory Visit: Payer: 59

## 2019-07-06 ENCOUNTER — Other Ambulatory Visit: Payer: Self-pay | Admitting: Family Medicine

## 2019-07-06 DIAGNOSIS — G8929 Other chronic pain: Secondary | ICD-10-CM

## 2019-07-06 DIAGNOSIS — M546 Pain in thoracic spine: Secondary | ICD-10-CM

## 2019-07-06 DIAGNOSIS — M47812 Spondylosis without myelopathy or radiculopathy, cervical region: Secondary | ICD-10-CM

## 2019-07-08 MED FILL — VYVANSE 40 MG CAPSULE: 40 | 30 days supply | Qty: 30 | Fill #0

## 2019-07-11 MED FILL — NAPROXEN 500 MG TABS: 500 | 30 days supply | Qty: 60 | Fill #1

## 2019-07-12 ENCOUNTER — Other Ambulatory Visit: Payer: Self-pay

## 2019-07-12 DIAGNOSIS — G8929 Other chronic pain: Secondary | ICD-10-CM

## 2019-07-12 DIAGNOSIS — M47812 Spondylosis without myelopathy or radiculopathy, cervical region: Secondary | ICD-10-CM

## 2019-07-12 MED ORDER — GABAPENTIN 300 MG PO CAPS: 900.0000 mg | ORAL_CAPSULE | Freq: Every day | ORAL | 2 refills | Status: DC

## 2019-07-12 MED FILL — GABAPENTIN 300 MG CAPSULE: 300 | 90 days supply | Qty: 270 | Fill #0

## 2019-07-13 DIAGNOSIS — M75101 Unspecified rotator cuff tear or rupture of right shoulder, not specified as traumatic: Secondary | ICD-10-CM | POA: Diagnosis not present

## 2019-07-13 DIAGNOSIS — Z9889 Other specified postprocedural states: Secondary | ICD-10-CM | POA: Diagnosis not present

## 2019-07-20 ENCOUNTER — Encounter: Payer: 59 | Admitting: Family Medicine

## 2019-10-10 ENCOUNTER — Other Ambulatory Visit: Payer: Self-pay | Admitting: Family Medicine

## 2019-10-10 DIAGNOSIS — N951 Menopausal and female climacteric states: Secondary | ICD-10-CM

## 2019-10-22 ENCOUNTER — Other Ambulatory Visit: Payer: Self-pay | Admitting: Family Medicine

## 2019-10-22 DIAGNOSIS — N951 Menopausal and female climacteric states: Secondary | ICD-10-CM

## 2019-10-24 NOTE — Telephone Encounter (Signed)
Please review

## 2020-02-13 ENCOUNTER — Other Ambulatory Visit: Payer: Self-pay

## 2020-02-13 ENCOUNTER — Other Ambulatory Visit: Payer: Self-pay | Admitting: Family Medicine

## 2020-02-13 DIAGNOSIS — E039 Hypothyroidism, unspecified: Secondary | ICD-10-CM

## 2020-02-13 NOTE — Telephone Encounter (Signed)
Please review

## 2020-03-19 ENCOUNTER — Other Ambulatory Visit: Payer: Self-pay | Admitting: Family Medicine

## 2020-03-19 DIAGNOSIS — G8929 Other chronic pain: Secondary | ICD-10-CM

## 2020-03-19 DIAGNOSIS — M47812 Spondylosis without myelopathy or radiculopathy, cervical region: Secondary | ICD-10-CM

## 2020-03-20 ENCOUNTER — Other Ambulatory Visit: Payer: Self-pay | Admitting: Family Medicine

## 2020-03-20 DIAGNOSIS — F902 Attention-deficit hyperactivity disorder, combined type: Secondary | ICD-10-CM

## 2020-03-20 NOTE — Telephone Encounter (Signed)
Last refill: 07/08/20 #30, 0 Last OV: 05/09/20 dx. CPE, NP appt 05/14/20

## 2020-03-20 NOTE — Telephone Encounter (Signed)
MEDICATION:  lisdexamfetamine (VYVANSE) 40 MG capsule   PHARMACY:  [id number] - , [place] - [address] AT [address] Phone:  [phone]  Fax:  [phone]       Comments: Patient is almost out she does have a TOC in October.   **Let patient know to contact pharmacy at the end of the day to make sure medication is ready. **  ** Please notify patient to allow 48-72 hours to process**  **Encourage patient to contact the pharmacy for refills or they can request refills through [facility]**

## 2020-03-21 MED ORDER — LISDEXAMFETAMINE DIMESYLATE 40 MG PO CAPS: 40.0000 mg | ORAL_CAPSULE | Freq: Every day | ORAL | 0 refills | Status: DC

## 2020-03-21 MED FILL — VYVANSE 40 MG CAPSULE: 40 | 30 days supply | Qty: 30 | Fill #0

## 2020-03-21 NOTE — Addendum Note (Signed)
Addended by: Orma Flaming on: 03/21/2020 03:54 PM   Modules accepted: Orders

## 2020-05-14 ENCOUNTER — Encounter: Payer: Self-pay | Admitting: Family Medicine

## 2020-05-14 ENCOUNTER — Other Ambulatory Visit: Payer: Self-pay

## 2020-05-14 ENCOUNTER — Ambulatory Visit (INDEPENDENT_AMBULATORY_CARE_PROVIDER_SITE_OTHER): Payer: BC Managed Care – PPO | Admitting: Family Medicine

## 2020-05-14 VITALS — BP 97/62 | HR 74 | Temp 97.7°F | Ht 64.0 in | Wt 134.8 lb

## 2020-05-14 DIAGNOSIS — N959 Unspecified menopausal and perimenopausal disorder: Secondary | ICD-10-CM

## 2020-05-14 DIAGNOSIS — Z0000 Encounter for general adult medical examination without abnormal findings: Secondary | ICD-10-CM

## 2020-05-14 DIAGNOSIS — Z23 Encounter for immunization: Secondary | ICD-10-CM | POA: Diagnosis not present

## 2020-05-14 DIAGNOSIS — F5102 Adjustment insomnia: Secondary | ICD-10-CM | POA: Diagnosis not present

## 2020-05-14 DIAGNOSIS — M546 Pain in thoracic spine: Secondary | ICD-10-CM

## 2020-05-14 DIAGNOSIS — E032 Hypothyroidism due to medicaments and other exogenous substances: Secondary | ICD-10-CM

## 2020-05-14 DIAGNOSIS — Z79890 Hormone replacement therapy (postmenopausal): Secondary | ICD-10-CM | POA: Diagnosis not present

## 2020-05-14 DIAGNOSIS — G8929 Other chronic pain: Secondary | ICD-10-CM

## 2020-05-14 DIAGNOSIS — F902 Attention-deficit hyperactivity disorder, combined type: Secondary | ICD-10-CM

## 2020-05-14 DIAGNOSIS — M47812 Spondylosis without myelopathy or radiculopathy, cervical region: Secondary | ICD-10-CM

## 2020-05-14 MED ORDER — LISDEXAMFETAMINE DIMESYLATE 40 MG PO CAPS: 40.0000 mg | ORAL_CAPSULE | Freq: Every day | ORAL | 0 refills | Status: DC

## 2020-05-14 MED ORDER — ESTRADIOL 0.5 MG PO TABS: 0.5000 mg | ORAL_TABLET | Freq: Every day | ORAL | 3 refills | Status: DC

## 2020-05-14 MED ORDER — TRAZODONE HCL 50 MG PO TABS: ORAL_TABLET | ORAL | 2 refills | Status: DC

## 2020-05-14 MED ORDER — GABAPENTIN 300 MG PO CAPS: 900.0000 mg | ORAL_CAPSULE | Freq: Every day | ORAL | 2 refills | Status: DC

## 2020-05-14 NOTE — Patient Instructions (Addendum)
1) refilled meds, waiting on thyroid and if normal will send this in for you 2) need DEXA-ordered this for you 3) get records for mammogram and do this yearly since on estrogen.  4) pneumonia shot today 5) routine labs today  6) so nice to meet you!!!   Preventive Care 65 Years and Older, Female Preventive care refers to lifestyle choices and visits with your health care provider that can promote health and wellness. This includes:  A yearly physical exam. This is also called an annual well check.  Regular dental and eye exams.  Immunizations.  Screening for certain conditions.  Healthy lifestyle choices, such as diet and exercise. What can I expect for my preventive care visit? Physical exam Your health care provider will check:  Height and weight. These may be used to calculate body mass index (BMI), which is a measurement that tells if you are at a healthy weight.  Heart rate and blood pressure.  Your skin for abnormal spots. Counseling Your health care provider may ask you questions about:  Alcohol, tobacco, and drug use.  Emotional well-being.  Home and relationship well-being.  Sexual activity.  Eating habits.  History of falls.  Memory and ability to understand (cognition).  Work and work Statistician.  Pregnancy and menstrual history. What immunizations do I need?  Influenza (flu) vaccine  This is recommended every year. Tetanus, diphtheria, and pertussis (Tdap) vaccine  You may need a Td booster every 10 years. Varicella (chickenpox) vaccine  You may need this vaccine if you have not already been vaccinated. Zoster (shingles) vaccine  You may need this after age 22. Pneumococcal conjugate (PCV13) vaccine  One dose is recommended after age 47. Pneumococcal polysaccharide (PPSV23) vaccine  One dose is recommended after age 55. Measles, mumps, and rubella (MMR) vaccine  You may need at least one dose of MMR if you were born in 1957 or later.  You may also need a second dose. Meningococcal conjugate (MenACWY) vaccine  You may need this if you have certain conditions. Hepatitis A vaccine  You may need this if you have certain conditions or if you travel or work in places where you may be exposed to hepatitis A. Hepatitis B vaccine  You may need this if you have certain conditions or if you travel or work in places where you may be exposed to hepatitis B. Haemophilus influenzae type b (Hib) vaccine  You may need this if you have certain conditions. You may receive vaccines as individual doses or as more than one vaccine together in one shot (combination vaccines). Talk with your health care provider about the risks and benefits of combination vaccines. What tests do I need? Blood tests  Lipid and cholesterol levels. These may be checked every 5 years, or more frequently depending on your overall health.  Hepatitis C test.  Hepatitis B test. Screening  Lung cancer screening. You may have this screening every year starting at age 71 if you have a 30-pack-year history of smoking and currently smoke or have quit within the past 15 years.  Colorectal cancer screening. All adults should have this screening starting at age 2 and continuing until age 46. Your health care provider may recommend screening at age 68 if you are at increased risk. You will have tests every 1-10 years, depending on your results and the type of screening test.  Diabetes screening. This is done by checking your blood sugar (glucose) after you have not eaten for a while (fasting). You  may have this done every 1-3 years.  Mammogram. This may be done every 1-2 years. Talk with your health care provider about how often you should have regular mammograms.  BRCA-related cancer screening. This may be done if you have a family history of breast, ovarian, tubal, or peritoneal cancers. Other tests  Sexually transmitted disease (STD) testing.  Bone density scan.  This is done to screen for osteoporosis. You may have this done starting at age 48. Follow these instructions at home: Eating and drinking  Eat a diet that includes fresh fruits and vegetables, whole grains, lean protein, and low-fat dairy products. Limit your intake of foods with high amounts of sugar, saturated fats, and salt.  Take vitamin and mineral supplements as recommended by your health care provider.  Do not drink alcohol if your health care provider tells you not to drink.  If you drink alcohol: ? Limit how much you have to 0-1 drink a day. ? Be aware of how much alcohol is in your drink. In the [place] one drink equals one 12 oz bottle of beer (355 mL), one 5 oz glass of wine (148 mL), or one 1 oz glass of hard liquor (44 mL). Lifestyle  Take daily care of your teeth and gums.  Stay active. Exercise for at least 30 minutes on 5 or more days each week.  Do not use any products that contain nicotine or tobacco, such as cigarettes, e-cigarettes, and chewing tobacco. If you need help quitting, ask your health care provider.  If you are sexually active, practice safe sex. Use a condom or other form of protection in order to prevent STIs (sexually transmitted infections).  Talk with your health care provider about taking a low-dose aspirin or statin. What's next?  Go to your health care provider once a year for a well check visit.  Ask your health care provider how often you should have your eyes and teeth checked.  Stay up to date on all vaccines. This information is not intended to replace advice given to you by your health care provider. Make sure you discuss any questions you have with your health care provider. Document Revised: 07/08/2018 Document Reviewed: 07/08/2018 Elsevier Patient Education  2020 [employer].

## 2020-05-14 NOTE — Progress Notes (Addendum)
Patient: Kara Kennedy MRN: [id number] DOB: 06/25/1956 PCP: Orma Flaming, MD     Subjective:  Chief Complaint  Patient presents with  . Transitions Of Care  . Annual Exam  . Hypothyroidism  . ADHD  . HRT    HPI: The patient is a 64 y.o. female who presents today for annual exam and to follow up on on chronic conditions. She denies any changes to past medical history. There have been no recent hospitalizations. They are following a well balanced diet and exercise plan. Pt walks 2-3 times weekly. Weight has been stable. Pt is requesting Estradiol, Premarin is not covered by her insurance.  Family history of colon cancer in her mother. UTD on scope and had genetic testing done.   Hypothyroidism:  Currently on 88mcg daily. Takes medication as prescribed. Will need refilled after I get her labs back.   ADHD Currently on vyvanse 40mg  daily. Will take drug holidays when not working. No side effects and takes as prescribed. pmp website verified.   HRT Is on premarin .625mg /day. is requesting estradiol since her insurance will no longer cover premarin. Had her hysterectomy in her 42s. utd on mammogram.   Immunization History  Administered Date(s) Administered  . Influenza Split 03/28/2012  . Influenza, Seasonal, Injecte, Preservative Fre 04/16/2015, 03/11/2016  . Influenza,inj,Quad PF,6+ Mos 05/10/2019  . Influenza,inj,quad, With Preservative 03/28/2018  . Influenza-Unspecified 04/14/2017, 03/26/2018  . Pneumococcal Polysaccharide-23 04/27/2008, 05/14/2020  . Tdap 03/06/2014   Colonoscopy: 01/2019 Mammogram: 08/2019 Pap smear: n/a   Had had covid vaccines. UTD. She doesn't have her card. Had done in [place].   Review of Systems  Constitutional: Negative for chills, fatigue and fever.  HENT: Negative for congestion, dental problem, ear pain, hearing loss, sore throat and trouble swallowing.   Eyes: Negative for visual disturbance.  Respiratory: Negative for cough, chest  tightness and shortness of breath.   Cardiovascular: Negative for chest pain, palpitations and leg swelling.  Gastrointestinal: Negative for abdominal pain, blood in stool, diarrhea, nausea and vomiting.  Endocrine: Negative for cold intolerance, polydipsia, polyphagia and polyuria.  Genitourinary: Negative for dysuria, frequency, hematuria and urgency.  Musculoskeletal: Negative for arthralgias.  Skin: Negative for rash.  Neurological: Negative for dizziness and headaches.  Psychiatric/Behavioral: Negative for dysphoric mood and sleep disturbance. The patient is not nervous/anxious.     Allergies Patient is allergic to depo-medrol [methylprednisolone sodium succ] and sulfamethoxazole-trimethoprim.  Past Medical History Patient  has a past medical history of Allergic rhinitis (07/30/2012), Allergy, Anxiety (03/06/2014), Chronic neck pain (09/06/2012), Colon polyps, Endometriosis, Family history of colon cancer (12/29/2016), Family history of polyps in the colon, History of pyelonephritis (07/30/2012), Hypothyroidism (03/06/2014), IBS (irritable bowel syndrome) (12/29/2016), Insomnia (08/19/2011), Pneumonia, PONV (postoperative nausea and vomiting), and Post menopausal syndrome (03/06/2014).  Surgical History Patient  has a past surgical history that includes Gallbladder surgery; Cesarean section; Spine surgery; Abdominal hysterectomy; Dilation and curettage of uterus; cervical fusion C5-C7 ; and Knee arthroscopy with medial menisectomy (Left, 04/30/2018).  Family History Pateint's family history includes Brain cancer (age of onset: 75) in her maternal grandmother; Colon cancer (age of onset: 5) in her mother; Colon polyps in her brother, brother, and sister; Diabetes in her father and paternal grandfather; Heart disease in her father; Liver cancer in her brother; Other in her brother.  Social History Patient  reports that she has quit smoking. She has never used smokeless tobacco. She reports current  alcohol use of about 7.0 standard drinks of alcohol per week. She reports  that she does not use drugs.    Objective: Vitals:   05/14/20 1045  BP: 97/62  Pulse: 74  Temp: 97.7 F (36.5 C)  TempSrc: Temporal  SpO2: 99%  Weight: 134 lb 12.8 oz (61.1 kg)  Height: 5\' 4"  (1.626 m)    Body mass index is 23.14 kg/m.  Physical Exam Vitals reviewed.  Constitutional:      Appearance: Normal appearance. She is well-developed and normal weight.  HENT:     Head: Normocephalic and atraumatic.     Right Ear: Tympanic membrane, ear canal and external ear normal.     Left Ear: Tympanic membrane, ear canal and external ear normal.     Mouth/Throat:     Mouth: Mucous membranes are moist.  Eyes:     Extraocular Movements: Extraocular movements intact.     Conjunctiva/sclera: Conjunctivae normal.     Pupils: Pupils are equal, round, and reactive to light.  Neck:     Thyroid: No thyromegaly.  Cardiovascular:     Rate and Rhythm: Normal rate and regular rhythm.     Pulses: Normal pulses.     Heart sounds: Normal heart sounds. No murmur heard.   Pulmonary:     Effort: Pulmonary effort is normal.     Breath sounds: Normal breath sounds.  Abdominal:     General: Abdomen is flat. Bowel sounds are normal. There is no distension.     Palpations: Abdomen is soft.     Tenderness: There is no abdominal tenderness.  Musculoskeletal:     Cervical back: Normal range of motion and neck supple.  Lymphadenopathy:     Cervical: No cervical adenopathy.  Skin:    General: Skin is warm and dry.     Capillary Refill: Capillary refill takes less than 2 seconds.     Findings: No rash.  Neurological:     General: No focal deficit present.     Mental Status: She is alert and oriented to person, place, and time.     Cranial Nerves: No cranial nerve deficit.     Coordination: Coordination normal.     Deep Tendon Reflexes: Reflexes normal.  Psychiatric:        Mood and Affect: Mood normal.         Behavior: Behavior normal.       Office Visit from 05/14/2020 in [place]  PHQ-2 Total Score 0          Assessment/plan: 1. Annual physical exam Fasting labs today. HM reviewed. dexa ordered/strep pneumonia vaccine today. Has had her covid vaccines and utd on other hm. Encouraged exercise. Overall doing well.  Patient counseling [x]    Nutrition: Stressed importance of moderation in sodium/caffeine intake, saturated fat and cholesterol, caloric balance, sufficient intake of fresh fruits, vegetables, fiber, calcium, iron, and 1 mg of folate supplement per day (for females capable of pregnancy).  [x]    Stressed the importance of regular exercise.   []    Substance Abuse: Discussed cessation/primary prevention of tobacco, alcohol, or other drug use; driving or other dangerous activities under the influence; availability of treatment for abuse.   [x]    Injury prevention: Discussed safety belts, safety helmets, smoke detector, smoking near bedding or upholstery.   [x]    Sexuality: Discussed sexually transmitted diseases, partner selection, use of condoms, avoidance of unintended pregnancy  and contraceptive alternatives.  [x]    Dental health: Discussed importance of regular tooth brushing, flossing, and dental visits.  [x]    Health maintenance and immunizations  reviewed. Please refer to Health maintenance section.    - CBC with Differential/Platelet; Future - Lipid panel; Future - COMPLETE METABOLIC PANEL WITH GFR; Future - COMPLETE METABOLIC PANEL WITH GFR - Lipid panel - CBC with Differential/Platelet  2. Hypothyroidism due to non-medication exogenous substances Will send in refill after I get labs back.  - T4, free; Future - TSH; Future - TSH - T4, free  3. Attention deficit hyperactivity disorder (ADHD), combined type Drug contract signed today. pmp website checked and filling correctly. Will need annual uds at next visit. F/u in 3 months for routine  f/u.  - lisdexamfetamine (VYVANSE) 40 MG capsule; Take 1 capsule (40 mg total) by mouth daily before breakfast.  Dispense: 30 capsule; Refill: 0  4. Hormone replacement therapy (HRT) Changed her over to estradiol, lowest dose. utd on mmg and discussed needs to do these yearly.   5. Adjustment insomnia Refills given.  - traZODone (DESYREL) 50 MG tablet; TAKE 1/2-1 TABLETS (25-50 MG TOTAL) BY MOUTH AT BEDTIME AS NEEDED FOR SLEEP.  Dispense: 90 tablet; Refill: 2   6. Menopausal and postmenopausal disorder  - DG Bone Density; Future  7. Need for vaccination for Strep pneumoniae  - Pneumococcal polysaccharide vaccine 23-valent greater than or equal to 2yo subcutaneous/IM    This visit occurred during the SARS-CoV-2 public health emergency.  Safety protocols were in place, including screening questions prior to the visit, additional usage of staff PPE, and extensive cleaning of exam room while observing appropriate contact time as indicated for disinfecting solutions.     Return in about 3 months (around 08/14/2020) for adhd .     Orma Flaming, MD [place]  05/14/2020

## 2020-05-15 ENCOUNTER — Encounter: Payer: Self-pay | Admitting: Family Medicine

## 2020-05-15 ENCOUNTER — Other Ambulatory Visit: Payer: Self-pay | Admitting: Family Medicine

## 2020-05-15 DIAGNOSIS — E039 Hypothyroidism, unspecified: Secondary | ICD-10-CM

## 2020-05-15 LAB — CBC WITH DIFFERENTIAL/PLATELET
Absolute Monocytes: 414 cells/uL (ref 200–950)
Basophils Absolute: 144 cells/uL (ref 0–200)
Basophils Relative: 2.4 %
Eosinophils Absolute: 144 cells/uL (ref 15–500)
Eosinophils Relative: 2.4 %
HCT: 40.9 % (ref 35.0–45.0)
Hemoglobin: 13.5 g/dL (ref 11.7–15.5)
Lymphs Abs: 1914 cells/uL (ref 850–3900)
MCH: 29.4 pg (ref 27.0–33.0)
MCHC: 33 g/dL (ref 32.0–36.0)
MCV: 89.1 fL (ref 80.0–100.0)
MPV: 10 fL (ref 7.5–12.5)
Monocytes Relative: 6.9 %
Neutro Abs: 3384 cells/uL (ref 1500–7800)
Neutrophils Relative %: 56.4 %
Platelets: 319 10*3/uL (ref 140–400)
RBC: 4.59 10*6/uL (ref 3.80–5.10)
RDW: 13.1 % (ref 11.0–15.0)
Total Lymphocyte: 31.9 %
WBC: 6 10*3/uL (ref 3.8–10.8)

## 2020-05-15 LAB — COMPLETE METABOLIC PANEL WITH GFR
AG Ratio: 2 (calc) (ref 1.0–2.5)
ALT: 19 U/L (ref 6–29)
AST: 18 U/L (ref 10–35)
Albumin: 4.5 g/dL (ref 3.6–5.1)
Alkaline phosphatase (APISO): 80 U/L (ref 37–153)
BUN: 10 mg/dL (ref 7–25)
CO2: 29 mmol/L (ref 20–32)
Calcium: 10.2 mg/dL (ref 8.6–10.4)
Chloride: 102 mmol/L (ref 98–110)
Creat: 0.76 mg/dL (ref 0.50–0.99)
GFR, Est African American: 95 mL/min/{1.73_m2} (ref >=60)
GFR, Est Non African American: 82 mL/min/{1.73_m2} (ref >=60)
Globulin: 2.2 g/dL (calc) (ref 1.9–3.7)
Glucose, Bld: 88 mg/dL (ref 65–99)
Potassium: 4.9 mmol/L (ref 3.5–5.3)
Sodium: 138 mmol/L (ref 135–146)
Total Bilirubin: 0.4 mg/dL (ref 0.2–1.2)
Total Protein: 6.7 g/dL (ref 6.1–8.1)

## 2020-05-15 LAB — TSH: TSH: 1.33 mIU/L (ref 0.40–4.50)

## 2020-05-15 LAB — LIPID PANEL
Cholesterol: 187 mg/dL (ref <200)
HDL: 100 mg/dL (ref >=50)
LDL Cholesterol (Calc): 69 mg/dL (calc)
Non-HDL Cholesterol (Calc): 87 mg/dL (calc) (ref <130)
Total CHOL/HDL Ratio: 1.9 (calc) (ref <5.0)
Triglycerides: 93 mg/dL (ref <150)

## 2020-05-15 LAB — T4, FREE: Free T4: 1.3 ng/dL (ref 0.8–1.8)

## 2020-05-15 MED ORDER — LEVOTHYROXINE SODIUM 88 MCG PO TABS: 88.0000 ug | ORAL_TABLET | Freq: Every morning | ORAL | 3 refills | Status: DC

## 2020-05-16 ENCOUNTER — Encounter: Payer: Self-pay | Admitting: Family Medicine

## 2020-05-17 ENCOUNTER — Encounter: Payer: Self-pay | Admitting: Family Medicine

## 2020-05-21 ENCOUNTER — Other Ambulatory Visit: Payer: Self-pay

## 2020-05-21 ENCOUNTER — Other Ambulatory Visit: Payer: Self-pay | Admitting: Family Medicine

## 2020-05-21 DIAGNOSIS — J302 Other seasonal allergic rhinitis: Secondary | ICD-10-CM

## 2020-05-21 DIAGNOSIS — K5909 Other constipation: Secondary | ICD-10-CM

## 2020-05-21 MED ORDER — MONTELUKAST SODIUM 10 MG PO TABS: 10.0000 mg | ORAL_TABLET | Freq: Every day | ORAL | 3 refills | Status: DC

## 2020-05-21 MED ORDER — LINACLOTIDE 290 MCG PO CAPS: ORAL_CAPSULE | ORAL | 3 refills | Status: DC

## 2020-05-21 NOTE — Telephone Encounter (Signed)
Patient is stating [facility] needs a prior auth  For Vyvanase even though its covered in order to mail medication please call [phone]

## 2020-05-21 NOTE — Telephone Encounter (Signed)
r 

## 2020-05-21 NOTE — Telephone Encounter (Signed)
.   LAST APPOINTMENT DATE: 05/14/2020   NEXT APPOINTMENT DATE:@12 /13/2021  MEDICATION:linaclotide (LINZESS) 290 MCG CAPS capsule   montelukast (SINGULAIR) 10 MG tablet  PHARMACY:[facility] [place], [place]  **Let patient know to contact pharmacy at the end of the day to make sure medication is ready. **  ** Please notify patient to allow 48-72 hours to process**  **Encourage patient to contact the pharmacy for refills or they can request refills through [facility]**  CLINICAL FILLS OUT ALL BELOW:   LAST REFILL:  QTY:  REFILL DATE:    OTHER COMMENTS:    Okay for refill?  Please advise

## 2020-05-21 NOTE — Telephone Encounter (Signed)
Rx sent to the preferred patient pharmacy  

## 2020-05-22 ENCOUNTER — Encounter: Payer: Self-pay | Admitting: Family Medicine

## 2020-05-23 ENCOUNTER — Other Ambulatory Visit: Payer: Self-pay | Admitting: Family Medicine

## 2020-05-23 MED ORDER — MELOXICAM 15 MG PO TABS: 15.0000 mg | ORAL_TABLET | Freq: Every day | ORAL | 1 refills | Status: DC

## 2020-05-24 ENCOUNTER — Other Ambulatory Visit: Payer: Self-pay

## 2020-05-24 DIAGNOSIS — F902 Attention-deficit hyperactivity disorder, combined type: Secondary | ICD-10-CM

## 2020-05-27 ENCOUNTER — Other Ambulatory Visit: Payer: Self-pay | Admitting: Family Medicine

## 2020-05-27 DIAGNOSIS — J302 Other seasonal allergic rhinitis: Secondary | ICD-10-CM

## 2020-05-27 DIAGNOSIS — K5909 Other constipation: Secondary | ICD-10-CM

## 2020-05-28 ENCOUNTER — Encounter: Payer: Self-pay | Admitting: Family Medicine

## 2020-05-28 DIAGNOSIS — F902 Attention-deficit hyperactivity disorder, combined type: Secondary | ICD-10-CM

## 2020-05-28 MED ORDER — LISDEXAMFETAMINE DIMESYLATE 40 MG PO CAPS: 40.0000 mg | ORAL_CAPSULE | Freq: Every day | ORAL | 0 refills | Status: DC

## 2020-05-28 NOTE — Telephone Encounter (Signed)
Dr Wallace pt °

## 2020-07-09 ENCOUNTER — Ambulatory Visit: Payer: BC Managed Care – PPO | Admitting: Family Medicine

## 2020-08-31 ENCOUNTER — Encounter: Payer: Self-pay | Admitting: Family Medicine

## 2020-08-31 ENCOUNTER — Other Ambulatory Visit: Payer: Self-pay | Admitting: Family Medicine

## 2020-08-31 ENCOUNTER — Other Ambulatory Visit: Payer: Self-pay

## 2020-08-31 ENCOUNTER — Ambulatory Visit: Payer: 59 | Admitting: Family Medicine

## 2020-08-31 VITALS — BP 116/76 | HR 76 | Temp 98.0°F | Ht 64.0 in | Wt 141.0 lb

## 2020-08-31 DIAGNOSIS — N959 Unspecified menopausal and perimenopausal disorder: Secondary | ICD-10-CM | POA: Diagnosis not present

## 2020-08-31 DIAGNOSIS — G8929 Other chronic pain: Secondary | ICD-10-CM | POA: Diagnosis not present

## 2020-08-31 DIAGNOSIS — K5909 Other constipation: Secondary | ICD-10-CM | POA: Diagnosis not present

## 2020-08-31 DIAGNOSIS — F5102 Adjustment insomnia: Secondary | ICD-10-CM

## 2020-08-31 DIAGNOSIS — Z79890 Hormone replacement therapy (postmenopausal): Secondary | ICD-10-CM | POA: Diagnosis not present

## 2020-08-31 DIAGNOSIS — F902 Attention-deficit hyperactivity disorder, combined type: Secondary | ICD-10-CM

## 2020-08-31 DIAGNOSIS — J302 Other seasonal allergic rhinitis: Secondary | ICD-10-CM | POA: Diagnosis not present

## 2020-08-31 DIAGNOSIS — E039 Hypothyroidism, unspecified: Secondary | ICD-10-CM | POA: Diagnosis not present

## 2020-08-31 DIAGNOSIS — M546 Pain in thoracic spine: Secondary | ICD-10-CM

## 2020-08-31 MED ORDER — MELOXICAM 15 MG PO TABS: 15.0000 mg | ORAL_TABLET | Freq: Every day | ORAL | 1 refills | Status: DC

## 2020-08-31 MED ORDER — TRAZODONE HCL 50 MG PO TABS: ORAL_TABLET | ORAL | 2 refills | Status: DC

## 2020-08-31 MED ORDER — GABAPENTIN 300 MG PO CAPS: 900.0000 mg | ORAL_CAPSULE | Freq: Every day | ORAL | 2 refills | Status: DC

## 2020-08-31 MED ORDER — MONTELUKAST SODIUM 10 MG PO TABS: 10.0000 mg | ORAL_TABLET | Freq: Every day | ORAL | 3 refills | Status: DC

## 2020-08-31 MED ORDER — LISDEXAMFETAMINE DIMESYLATE 50 MG PO CAPS: 50.0000 mg | ORAL_CAPSULE | Freq: Every day | ORAL | 0 refills | Status: DC

## 2020-08-31 MED ORDER — LEVOTHYROXINE SODIUM 88 MCG PO TABS: 88.0000 ug | ORAL_TABLET | Freq: Every morning | ORAL | 3 refills | Status: DC

## 2020-08-31 MED ORDER — ESTRADIOL 0.5 MG PO TABS: 0.7500 mg | ORAL_TABLET | Freq: Every day | ORAL | 3 refills | Status: DC

## 2020-08-31 MED ORDER — LINACLOTIDE 290 MCG PO CAPS
ORAL_CAPSULE | ORAL | 3 refills | Status: DC
Start: 2020-08-31 — End: 2020-08-31

## 2020-08-31 MED FILL — VYVANSE 50 MG CAPSULE: 50 | 90 days supply | Qty: 90 | Fill #0

## 2020-08-31 MED FILL — MELOXICAM 15 MG TABLET: 15 | 90 days supply | Qty: 90 | Fill #0

## 2020-08-31 MED FILL — LINZESS 290 MCG CAPSULE: 290 | 90 days supply | Qty: 90 | Fill #0

## 2020-08-31 MED FILL — SYNTHROID 88 MCG TABLET: 88 | 90 days supply | Qty: 90 | Fill #0

## 2020-08-31 MED FILL — MONTELUKAST SOD 10 MG TAB: 10 | 90 days supply | Qty: 90 | Fill #0

## 2020-08-31 MED FILL — ESTRADIOL 0.5 MG TABS: 0.5 | 90 days supply | Qty: 135 | Fill #0

## 2020-08-31 MED FILL — traZODone HCL 50 MG TABS: 50 | 90 days supply | Qty: 90 | Fill #0

## 2020-08-31 MED FILL — GABAPENTIN 300 MG CAPSULE: 300 | 90 days supply | Qty: 270 | Fill #0

## 2020-08-31 NOTE — Progress Notes (Signed)
Patient: Kara Kennedy MRN: [id number] DOB: 03/30/1956 PCP: Orma Flaming, MD     Subjective:  Chief Complaint  Patient presents with  . ADHD  . Hot Flashes    HPI: The patient is a 65 y.o. female who presents today for ADHD. Pt complains of hot flashes, Irritability, and night sweats. She is requesting an increase on the Estradiol.   ADHD She is currently on vyvanse 40mg . She doesn't think it's getting her through the whole day at work. She feels like it wears off by the afternoon. Interested in increasing this. 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   She is having hot flashes. She doesn't notice much during the day, but it's very bad at night. Her family has also noticed she is more irritable. She is thinking she needs her medication increased. On HRT, estradiol only .5mg /day.   Review of Systems  Constitutional: Negative for chills, fatigue and fever.  HENT: Negative for dental problem, ear pain, hearing loss and trouble swallowing.   Eyes: Negative for visual disturbance.  Respiratory: Negative for cough, chest tightness and shortness of breath.   Cardiovascular: Negative for chest pain, palpitations and leg swelling.  Gastrointestinal: Negative for abdominal pain, blood in stool, diarrhea and nausea.  Endocrine: Negative for cold intolerance, polydipsia, polyphagia and polyuria.  Genitourinary: Negative for dysuria and hematuria.  Musculoskeletal: Negative for arthralgias.  Skin: Negative for rash.  Neurological: Negative for dizziness and headaches.  Psychiatric/Behavioral: Positive for decreased concentration. Negative for dysphoric mood and sleep disturbance. The patient is not nervous/anxious.        Increased irritability     Allergies Patient is allergic to depo-medrol [methylprednisolone sodium succ] and sulfamethoxazole-trimethoprim.  Past Medical History Patient  has a past medical history of Allergic rhinitis (07/30/2012), Allergy, Anxiety (03/06/2014), Chronic  neck pain (09/06/2012), Colon polyps, Endometriosis, Family history of colon cancer (12/29/2016), Family history of polyps in the colon, History of pyelonephritis (07/30/2012), Hypothyroidism (03/06/2014), IBS (irritable bowel syndrome) (12/29/2016), Insomnia (08/19/2011), Pneumonia, PONV (postoperative nausea and vomiting), and Post menopausal syndrome (03/06/2014).  Surgical History Patient  has a past surgical history that includes Gallbladder surgery; Cesarean section; Spine surgery; Abdominal hysterectomy; Dilation and curettage of uterus; cervical fusion C5-C7 ; and Knee arthroscopy with medial menisectomy (Left, 04/30/2018).  Family History Pateint's family history includes Brain cancer (age of onset: 92) in her maternal grandmother; Colon cancer (age of onset: 19) in her mother; Colon polyps in her brother, brother, and sister; Diabetes in her father and paternal grandfather; Heart disease in her father; Liver cancer in her brother; Other in her brother.  Social History Patient  reports that she has quit smoking. She has never used smokeless tobacco. She reports current alcohol use of about 7.0 standard drinks of alcohol per week. She reports that she does not use drugs.    Objective: Vitals:   08/31/20 1103  BP: 116/76  Pulse: 76  Temp: 98 F (36.7 C)  TempSrc: Temporal  SpO2: 98%  Weight: 141 lb (64 kg)  Height: 5\' 4"  (1.626 m)    Body mass index is 24.2 kg/m.  Physical Exam Vitals reviewed.  Constitutional:      Appearance: Normal appearance. She is well-developed, normal weight and well-nourished.  HENT:     Head: Normocephalic and atraumatic.     Right Ear: External ear normal.     Left Ear: External ear normal.     Mouth/Throat:     Mouth: Oropharynx is clear and moist.  Eyes:  Extraocular Movements: EOM normal.     Conjunctiva/sclera: Conjunctivae normal.     Pupils: Pupils are equal, round, and reactive to light.  Neck:     Thyroid: No thyromegaly.     Vascular: No  carotid bruit.  Cardiovascular:     Rate and Rhythm: Normal rate and regular rhythm.     Pulses: Intact distal pulses.     Heart sounds: Normal heart sounds. No murmur heard.   Pulmonary:     Effort: Pulmonary effort is normal.     Breath sounds: Normal breath sounds.  Abdominal:     General: Abdomen is flat. Bowel sounds are normal. There is no distension.     Palpations: Abdomen is soft.     Tenderness: There is no abdominal tenderness.  Musculoskeletal:     Cervical back: Normal range of motion and neck supple.  Lymphadenopathy:     Cervical: No cervical adenopathy.  Skin:    General: Skin is warm and dry.     Capillary Refill: Capillary refill takes less than 2 seconds.     Findings: No rash.  Neurological:     General: No focal deficit present.     Mental Status: She is alert and oriented to person, place, and time.     Cranial Nerves: No cranial nerve deficit.     Coordination: Coordination normal.     Deep Tendon Reflexes: Reflexes normal.  Psychiatric:        Mood and Affect: Mood and affect and mood normal.        Behavior: Behavior normal.        Assessment/plan: 1. Attention deficit hyperactivity disorder (ADHD), combined type We are going to increase her vyvanse to 50mg /day to see if we can get her through her long work days. Any issues she is to let me know. PMP website checked. Filling correctly. 90 day supply given. F/u in 3 months or let me know sooner if not tolerating increased dosage.   2. HRT -will do very mild increase in her dosage to .75mg  of estradiol. Need her utd on her mmg and discussed time to repeat this. Needs yearly with being on HRT. handtout given for mmg and also will do dexa scan.   3. Menopausal and postmenopausal disorder  - DG Bone Density; Future  -refilled all meds to local pharmacy from mail order.   This visit occurred during the SARS-CoV-2 public health emergency.  Safety protocols were in place, including screening questions  prior to the visit, additional usage of staff PPE, and extensive cleaning of exam room while observing appropriate contact time as indicated for disinfecting solutions.     Return in about 3 months (around 11/28/2020) for ADHD.    Orma Flaming, MD [place]   08/31/2020

## 2020-08-31 NOTE — Patient Instructions (Signed)
-  increased your vyvanse to 50mg /day  -let's increase your estradiol to .75mg /day. if needed we can increase up to 1mg /day. Make sure you get mammogram since increased risk of breast cancer on these HRT.    So good to see you! Dr. Rogers Blocker

## 2020-09-10 ENCOUNTER — Other Ambulatory Visit: Payer: Self-pay | Admitting: Family Medicine

## 2020-09-10 DIAGNOSIS — N959 Unspecified menopausal and perimenopausal disorder: Secondary | ICD-10-CM

## 2020-10-18 ENCOUNTER — Other Ambulatory Visit (HOSPITAL_BASED_OUTPATIENT_CLINIC_OR_DEPARTMENT_OTHER): Payer: Self-pay

## 2020-11-16 ENCOUNTER — Other Ambulatory Visit (HOSPITAL_COMMUNITY): Payer: Self-pay

## 2020-11-16 MED FILL — Levothyroxine Sodium Tab 88 MCG: ORAL | 90 days supply | Qty: 90 | Fill #0 | Status: AC

## 2020-11-16 MED FILL — Trazodone HCl Tab 50 MG: ORAL | 90 days supply | Qty: 90 | Fill #0 | Status: AC

## 2020-11-16 MED FILL — Gabapentin Cap 300 MG: ORAL | 90 days supply | Qty: 270 | Fill #0 | Status: AC

## 2020-11-16 MED FILL — Meloxicam Tab 15 MG: ORAL | 90 days supply | Qty: 90 | Fill #0 | Status: AC

## 2020-11-16 MED FILL — Estradiol Tab 0.5 MG: ORAL | 90 days supply | Qty: 135 | Fill #0 | Status: AC

## 2020-11-16 MED FILL — Linaclotide Cap 290 MCG: ORAL | 90 days supply | Qty: 90 | Fill #0 | Status: AC

## 2020-11-16 MED FILL — Montelukast Sodium Tab 10 MG (Base Equiv): ORAL | 90 days supply | Qty: 90 | Fill #0 | Status: AC

## 2020-11-19 ENCOUNTER — Other Ambulatory Visit (HOSPITAL_COMMUNITY): Payer: Self-pay

## 2020-11-21 ENCOUNTER — Ambulatory Visit: Payer: 59 | Admitting: Family Medicine

## 2020-11-21 ENCOUNTER — Other Ambulatory Visit (HOSPITAL_COMMUNITY): Payer: Self-pay

## 2020-11-21 ENCOUNTER — Encounter: Payer: Self-pay | Admitting: Family Medicine

## 2020-11-21 VITALS — BP 122/80 | HR 77 | Temp 98.1°F | Ht 64.0 in | Wt 140.0 lb

## 2020-11-21 DIAGNOSIS — F902 Attention-deficit hyperactivity disorder, combined type: Secondary | ICD-10-CM | POA: Diagnosis not present

## 2020-11-21 MED ORDER — LISDEXAMFETAMINE DIMESYLATE 50 MG PO CAPS
ORAL_CAPSULE | Freq: Every day | ORAL | 0 refills | Status: DC
  Filled 2020-11-21: qty 90, 90d supply, fill #0

## 2020-11-21 NOTE — Progress Notes (Signed)
Patient: Kara Kennedy MRN: [id number] DOB: 09/25/1955 PCP: Orma Flaming, MD     Subjective:  Chief Complaint  Patient presents with  . ADHD    3 month f/u  . Medication Refill    Vyvanse    HPI: The patient is a 65 y.o. female who presents today for ADHD follow up. Routine 3 month ADHD follow up. She is currently on vyvanse 50mg /day. Takes as prescribed. No side effects. Specifically denies headaches, insomnia, GI upset, palpitations or weight loss.  Needs a refill.   Review of Systems  Constitutional: Negative for chills, fatigue and fever.  HENT: Negative for congestion, dental problem, ear pain, hearing loss and trouble swallowing.   Eyes: Negative for visual disturbance.  Respiratory: Negative for cough, chest tightness and shortness of breath.   Cardiovascular: Negative for chest pain, palpitations and leg swelling.  Gastrointestinal: Negative for abdominal pain, blood in stool, diarrhea and nausea.  Endocrine: Negative for cold intolerance, polydipsia, polyphagia and polyuria.  Genitourinary: Negative for dysuria and hematuria.  Musculoskeletal: Negative for arthralgias.  Skin: Negative for rash.  Neurological: Negative for dizziness and headaches.  Psychiatric/Behavioral: Negative for dysphoric mood and sleep disturbance. The patient is not nervous/anxious.     Allergies Patient is allergic to depo-medrol [methylprednisolone sodium succ] and sulfamethoxazole-trimethoprim.  Past Medical History Patient  has a past medical history of Allergic rhinitis (07/30/2012), Allergy, Anxiety (03/06/2014), Chronic neck pain (09/06/2012), Colon polyps, Endometriosis, Family history of colon cancer (12/29/2016), Family history of polyps in the colon, History of pyelonephritis (07/30/2012), Hypothyroidism (03/06/2014), IBS (irritable bowel syndrome) (12/29/2016), Insomnia (08/19/2011), Pneumonia, PONV (postoperative nausea and vomiting), and Post menopausal syndrome (03/06/2014).  Surgical  History Patient  has a past surgical history that includes Gallbladder surgery; Cesarean section; Spine surgery; Abdominal hysterectomy; Dilation and curettage of uterus; cervical fusion C5-C7 ; and Knee arthroscopy with medial menisectomy (Left, 04/30/2018).  Family History Pateint's family history includes Brain cancer (age of onset: 59) in her maternal grandmother; Colon cancer (age of onset: 57) in her mother; Colon polyps in her brother, brother, and sister; Diabetes in her father and paternal grandfather; Heart disease in her father; Liver cancer in her brother; Other in her brother.  Social History Patient  reports that she has quit smoking. She has never used smokeless tobacco. She reports current alcohol use of about 7.0 standard drinks of alcohol per week. She reports that she does not use drugs.    Objective: Vitals:   11/21/20 1447  BP: 122/80  Pulse: 77  Temp: 98.1 F (36.7 C)  TempSrc: Temporal  SpO2: 100%  Weight: 140 lb (63.5 kg)  Height: 5\' 4"  (1.626 m)    Body mass index is 24.03 kg/m.  Physical Exam Vitals reviewed.  Constitutional:      Appearance: Normal appearance. She is normal weight.  Cardiovascular:     Rate and Rhythm: Normal rate and regular rhythm.     Heart sounds: Normal heart sounds.  Pulmonary:     Effort: Pulmonary effort is normal.     Breath sounds: Normal breath sounds.  Abdominal:     General: Abdomen is flat. Bowel sounds are normal.     Palpations: Abdomen is soft.  Neurological:     General: No focal deficit present.     Mental Status: She is alert and oriented to person, place, and time.  Psychiatric:        Mood and Affect: Mood normal.        Behavior: Behavior normal.  Assessment/plan: 1. Attention deficit hyperactivity disorder (ADHD), combined type -well controlled on current medication. 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  -pmp website reviewed and filling  correctly.  -90 day supply refilled today -Needs yearly UDS at next visit.      Return in about 3 months (around 02/20/2021) for routine adhd f/u  with alyssa. Orma Flaming, MD [place]   11/21/2020

## 2020-11-22 ENCOUNTER — Other Ambulatory Visit (HOSPITAL_COMMUNITY): Payer: Self-pay

## 2021-02-08 ENCOUNTER — Ambulatory Visit
Admission: RE | Admit: 2021-02-08 | Discharge: 2021-02-08 | Disposition: A | Discharge status: Home / Self Care | Payer: 59 | Source: Ambulatory Visit | Attending: Family Medicine | Admitting: Family Medicine

## 2021-02-08 ENCOUNTER — Other Ambulatory Visit: Payer: Self-pay

## 2021-02-08 DIAGNOSIS — M8589 Other specified disorders of bone density and structure, multiple sites: Secondary | ICD-10-CM | POA: Diagnosis not present

## 2021-02-08 DIAGNOSIS — Z780 Asymptomatic menopausal state: Secondary | ICD-10-CM | POA: Diagnosis not present

## 2021-02-08 DIAGNOSIS — N959 Unspecified menopausal and perimenopausal disorder: Secondary | ICD-10-CM

## 2021-02-22 ENCOUNTER — Other Ambulatory Visit (HOSPITAL_COMMUNITY): Payer: Self-pay

## 2021-02-22 ENCOUNTER — Other Ambulatory Visit: Payer: Self-pay | Admitting: Family Medicine

## 2021-02-22 MED FILL — Estradiol Tab 0.5 MG: ORAL | 90 days supply | Qty: 90 | Fill #1 | Status: AC

## 2021-02-22 MED FILL — Gabapentin Cap 300 MG: ORAL | 90 days supply | Qty: 270 | Fill #1 | Status: AC

## 2021-02-22 MED FILL — Meloxicam Tab 15 MG: ORAL | 90 days supply | Qty: 90 | Fill #0 | Status: AC

## 2021-02-22 MED FILL — Montelukast Sodium Tab 10 MG (Base Equiv): ORAL | 90 days supply | Qty: 90 | Fill #1 | Status: AC

## 2021-02-22 MED FILL — Levothyroxine Sodium Tab 88 MCG: ORAL | 90 days supply | Qty: 90 | Fill #1 | Status: AC

## 2021-02-22 MED FILL — Trazodone HCl Tab 50 MG: ORAL | 90 days supply | Qty: 90 | Fill #1 | Status: AC

## 2021-02-22 MED FILL — Linaclotide Cap 290 MCG: ORAL | 90 days supply | Qty: 90 | Fill #1 | Status: AC

## 2021-03-18 ENCOUNTER — Other Ambulatory Visit (HOSPITAL_COMMUNITY): Payer: Self-pay

## 2021-03-18 ENCOUNTER — Encounter: Payer: Self-pay | Admitting: Family Medicine

## 2021-03-18 ENCOUNTER — Ambulatory Visit: Payer: 59 | Admitting: Family Medicine

## 2021-03-18 ENCOUNTER — Other Ambulatory Visit: Payer: Self-pay

## 2021-03-18 VITALS — BP 118/77 | HR 76 | Temp 98.1°F | Ht 64.0 in | Wt 140.0 lb

## 2021-03-18 DIAGNOSIS — E032 Hypothyroidism due to medicaments and other exogenous substances: Secondary | ICD-10-CM | POA: Diagnosis not present

## 2021-03-18 DIAGNOSIS — F902 Attention-deficit hyperactivity disorder, combined type: Secondary | ICD-10-CM | POA: Diagnosis not present

## 2021-03-18 DIAGNOSIS — M940 Chondrocostal junction syndrome [Tietze]: Secondary | ICD-10-CM

## 2021-03-18 DIAGNOSIS — M19011 Primary osteoarthritis, right shoulder: Secondary | ICD-10-CM

## 2021-03-18 MED ORDER — PREDNISONE 50 MG PO TABS
ORAL_TABLET | ORAL | 0 refills | Status: DC
  Filled 2021-03-18: qty 5, 5d supply, fill #0

## 2021-03-18 MED ORDER — LISDEXAMFETAMINE DIMESYLATE 50 MG PO CAPS
ORAL_CAPSULE | Freq: Every day | ORAL | 0 refills | Status: DC
  Filled 2021-03-18: qty 90, 90d supply, fill #0

## 2021-03-18 NOTE — Patient Instructions (Signed)
It was very nice to see you today!  I will refill your Vyvanse today.  Please try the prednisone for the inflammation in your chest.  I will see you back soon for your annual physical.  Come back to see me sooner if needed.  Take care, Dr Jerline Pain  PLEASE NOTE:  If you had any lab tests please let us know if you have not heard back within a few days. You may see your results on mychart before we have a chance to review them but we will give you a call once they are reviewed by [place]. If we ordered any referrals today, please let us know if you have not heard from their office within the next week.   Please try these tips to maintain a healthy lifestyle:  Eat at least 3 REAL meals and 1-2 snacks per day.  Aim for no more than 5 hours between eating.  If you eat breakfast, please do so within one hour of getting up.   Each meal should contain half fruits/vegetables, one quarter protein, and one quarter carbs (no bigger than a computer mouse)  Cut down on sweet beverages. This includes juice, soda, and sweet tea.   Drink at least 1 glass of water with each meal and aim for at least 8 glasses per day  Exercise at least 150 minutes every week.

## 2021-03-18 NOTE — Assessment & Plan Note (Signed)
Database with no red flags.  Refill Vyvanse today.  She is tolerating well without side effects.  Medications help with ability to stay focused and on task.

## 2021-03-18 NOTE — Progress Notes (Signed)
   Kara Kennedy is a 65 y.o. female who presents today for an office visit.  Assessment/Plan:  New/Acute Problems: Chest wall pain Consistent with costochondritis.  She is already on meloxicam.  We will add on prednisone for a few days.  If still not improving will consider referral to PT or sports med.  No red flags.  History not consistent with cardiac etiology.  Chronic Problems Addressed Today: Attention deficit hyperactivity disorder (ADHD), combined type Database with no red flags.  Refill Vyvanse today.  She is tolerating well without side effects.  Medications help with ability to stay focused and on task.  Hypothyroidism On Synthroid 88 mcg daily.  We can recheck TSH when she comes back in a few months for CPE.     Subjective:  HPI: She states that she has pain in the right side of the center of her chest that travels up her shoulder and through her neck. It occurred after cleaning a window, when she heard a pop in her chest. She denies tenderness. Raising arms up does not produce pain, but putting arms low and behind her back outstretched produces pain. She takes a Meloxicam every day.  She denies experiencing any side effects from the Vyvanse. She is compliant with all medication with no notable side effects.       Objective:  Physical Exam: BP 118/77   Pulse 76   Temp 98.1 F (36.7 C) (Temporal)   Ht '5\' 4"'$  (1.626 m)   Wt 140 lb (63.5 kg)   LMP  (LMP Unknown)   SpO2 100%   BMI 24.03 kg/m   Gen: No acute distress, resting comfortably CV: Regular rate and rhythm with no murmurs appreciated Pulm: Normal work of breathing, clear to auscultation bilaterally with no crackles, wheezes, or rhonchi MSK: Right chest wall tenderness to palpation along sternal border. Neuro: Grossly normal, moves all extremities Psych: Normal affect and thought content      I,Jordan Kelly,acting as a scribe for Dimas Chyle, MD.,have documented all relevant documentation on the behalf  of Dimas Chyle, MD,as directed by  Dimas Chyle, MD while in the presence of Dimas Chyle, MD.  I, Dimas Chyle, MD, have reviewed all documentation for this visit. The documentation on 03/18/21 for the exam, diagnosis, procedures, and orders are all accurate and complete.  Algis Greenhouse. Jerline Pain, MD 03/18/2021 2:34 PM

## 2021-03-18 NOTE — Assessment & Plan Note (Signed)
On Synthroid 88 mcg daily.  We can recheck TSH when she comes back in a few months for CPE.

## 2021-03-20 ENCOUNTER — Encounter: Payer: Self-pay | Admitting: Family Medicine

## 2021-03-21 ENCOUNTER — Other Ambulatory Visit (HOSPITAL_COMMUNITY): Payer: Self-pay

## 2021-03-21 ENCOUNTER — Encounter: Payer: 59 | Admitting: Family Medicine

## 2021-03-21 ENCOUNTER — Other Ambulatory Visit: Payer: Self-pay | Admitting: *Deleted

## 2021-03-21 ENCOUNTER — Other Ambulatory Visit: Payer: Self-pay | Admitting: Physician Assistant

## 2021-03-21 DIAGNOSIS — M25519 Pain in unspecified shoulder: Secondary | ICD-10-CM

## 2021-03-21 MED ORDER — HYDROCODONE-ACETAMINOPHEN 5-325 MG PO TABS
1.0000 | ORAL_TABLET | Freq: Four times a day (QID) | ORAL | 0 refills | Status: DC | PRN
  Filled 2021-03-21: qty 12, 3d supply, fill #0

## 2021-03-21 NOTE — Telephone Encounter (Signed)
Referral placed, see note

## 2021-03-22 ENCOUNTER — Ambulatory Visit (INDEPENDENT_AMBULATORY_CARE_PROVIDER_SITE_OTHER): Payer: 59

## 2021-03-22 ENCOUNTER — Other Ambulatory Visit (HOSPITAL_COMMUNITY): Payer: Self-pay

## 2021-03-22 ENCOUNTER — Ambulatory Visit: Payer: 59 | Admitting: Family Medicine

## 2021-03-22 ENCOUNTER — Other Ambulatory Visit: Payer: Self-pay

## 2021-03-22 VITALS — BP 118/80 | HR 81 | Ht 64.0 in | Wt 139.8 lb

## 2021-03-22 DIAGNOSIS — R0782 Intercostal pain: Secondary | ICD-10-CM | POA: Diagnosis not present

## 2021-03-22 DIAGNOSIS — R079 Chest pain, unspecified: Secondary | ICD-10-CM | POA: Diagnosis not present

## 2021-03-22 MED ORDER — TIZANIDINE HCL 2 MG PO TABS
2.0000 mg | ORAL_TABLET | Freq: Three times a day (TID) | ORAL | 1 refills | Status: DC | PRN
  Filled 2021-03-22: qty 60, 10d supply, fill #0

## 2021-03-22 NOTE — Patient Instructions (Addendum)
Thank you for coming in today.   Try a binder.  Try a heating pad.   Try the tizanidine mostly at bedtime.  Tylenol and mobic.    Plan for PT.   Recheck in 1 month.   Return sooner if needed.

## 2021-03-22 NOTE — Progress Notes (Signed)
I, Wendy Poet, LAT, ATC, am serving as scribe for Dr. Lynne Leader.  Kara Kennedy is a 65 y.o. female who presents to [place] [facility] today for R-sided chest wall pain that began after cleaning a window when she heard a "pop" in her chest that occurred a week ago.  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 She was last seen by Dr. Tamala Julian on 02/11/19 for R shoulder pain.  She locates her pain to R side of the sternum w/ radiating pain into neck, axilla, and humerus.  Radiating pain: yes Aggravating factors: T-spine ext AROM; placing her arms into ext and aBd; deep breathing, sleeping Treatments tried: prednisone, Meloxicam, hydrocodone-acetaminophen,   Pertinent review of systems: No fevers or chills  Relevant historical information: IBS.  ADHD.  Patient works as a Designer, jewellery in psychiatry.   Exam:  BP 118/80   Pulse 81   Ht '5\' 4"'$  (1.626 m)   Wt 139 lb 12.8 oz (63.4 kg)   LMP  (LMP Unknown)   SpO2 98%   BMI 24.00 kg/m  General: Well Developed, well nourished, and in no acute distress.   MSK: Right chest normal-appearing tender palpation right costal sternal margin. Normal arm and shoulder motion.  Pectoralis activation intact nontender. Chest expansion is symmetrical bilaterally.    Lab and Radiology Results No results found for this or any previous visit (from the past 72 hour(s)). DG Chest 2 View  Result Date: 03/22/2021 CLINICAL DATA:  65 year old female with chest pain EXAM: CHEST - 2 VIEW COMPARISON:  05/22/2017 FINDINGS: Cardiomediastinal silhouette unchanged in size and contour. No evidence of central vascular congestion. No interlobular septal thickening. No pneumothorax or pleural effusion. Coarsened interstitial markings, with no confluent airspace disease. No acute displaced fracture. Degenerative changes of the spine. Surgical changes of the cervical region incompletely imaged IMPRESSION: Chronic lung changes without definite  evidence of acute cardiopulmonary disease Electronically Signed   By: Corrie Mckusick D.O.   On: 03/22/2021 11:18    I, Lynne Leader, personally (independently) visualized and performed the interpretation of the images attached in this note.    Assessment and Plan: 65 y.o. female with right chest wall strain.  No evidence of fracture based on x-ray however radiographically occult fracture is possible. Plan for conservative management strategies.  Recommend binding, heating pad, and tizanidine.  Recommend maximizing over-the-counter medications such as Tylenol and continue her meloxicam.  Refer to physical therapy to start in about a week or 2.  If not improving or if worsening will proceed to CT scan of the chest without contrast.  Recheck in about a month.  PDMP not reviewed this encounter. Orders Placed This Encounter  Procedures   DG Chest 2 View    Standing Status:   Future    Number of Occurrences:   1    Standing Expiration Date:   03/22/2022    Order Specific Question:   Reason for Exam (SYMPTOM  OR DIAGNOSIS REQUIRED)    Answer:   chest pain    Order Specific Question:   Preferred imaging location?    Answer:   [facility]   Ambulatory referral to Physical Therapy    Referral Priority:   Routine    Referral Type:   Physical Medicine    Referral Reason:   Specialty Services Required    Requested Specialty:   Physical Therapy    Number of Visits Requested:   1   Meds ordered this  encounter  Medications   tiZANidine (ZANAFLEX) 2 MG tablet    Sig: Take 1-2 tablets (2-4 mg total) by mouth every 8 (eight) hours as needed for muscle spasms.    Dispense:  60 tablet    Refill:  1     Discussed warning signs or symptoms. Please see discharge instructions. Patient expresses understanding.   The above documentation has been reviewed and is accurate and complete Lynne Leader, M.D.

## 2021-03-22 NOTE — Progress Notes (Signed)
Note duplication 

## 2021-03-25 ENCOUNTER — Ambulatory Visit: Payer: 59 | Admitting: Family Medicine

## 2021-03-25 NOTE — Progress Notes (Signed)
Chest x-ray shows chronic lung disease.  No acute rib fractures visible.

## 2021-04-11 ENCOUNTER — Encounter: Payer: Self-pay | Admitting: Physical Therapy

## 2021-04-11 ENCOUNTER — Other Ambulatory Visit: Payer: Self-pay

## 2021-04-11 ENCOUNTER — Ambulatory Visit (INDEPENDENT_AMBULATORY_CARE_PROVIDER_SITE_OTHER): Payer: 59 | Admitting: Physical Therapy

## 2021-04-11 DIAGNOSIS — M546 Pain in thoracic spine: Secondary | ICD-10-CM

## 2021-04-11 NOTE — Patient Instructions (Signed)
Access Code: [email or web address] URL: [email or web address] Date: 04/11/2021 Prepared by: Lyndee Hensen  Exercises Standing Shoulder Flexion Full Range - 1 x daily - 1 sets - 10 reps Standing Shoulder Abduction Full Range - 1 x daily - 1 sets - 10 reps Standing Row with Anchored Resistance - 1 x daily - 2 sets - 10 reps Standing Shoulder External Rotation with Resistance - 1 x daily - 1-2 sets - 10 reps Standing Shoulder Horizontal Abduction with Resistance - 1 x daily - 1 sets - 10 reps Supine Shoulder Horizontal Abduction with Resistance - 1 x daily - 1 sets - 10 reps

## 2021-04-21 ENCOUNTER — Encounter: Payer: Self-pay | Admitting: Physical Therapy

## 2021-04-24 ENCOUNTER — Encounter: Payer: 59 | Admitting: Family Medicine

## 2021-04-25 ENCOUNTER — Other Ambulatory Visit: Payer: Self-pay

## 2021-04-25 ENCOUNTER — Encounter: Payer: Self-pay | Admitting: Physical Therapy

## 2021-04-25 ENCOUNTER — Ambulatory Visit (INDEPENDENT_AMBULATORY_CARE_PROVIDER_SITE_OTHER): Payer: 59 | Admitting: Physical Therapy

## 2021-04-25 DIAGNOSIS — M546 Pain in thoracic spine: Secondary | ICD-10-CM

## 2021-04-25 NOTE — Therapy (Signed)
Kara Kennedy [address] [place], [place], [address] Phone: [phone]   Fax:  [phone]  Physical Therapy Treatment/Discharge  Patient Details  Name: Kara Kennedy MRN: [id number] Date of Birth: Dec 24, 1955 Referring Provider (PT): Kara Kennedy   Encounter Date: 04/25/2021   PT End of Session - 04/25/21 1557     Visit Number 2    Number of Visits 12    Date for PT Re-Evaluation 05/23/21    Authorization Type [facility]    PT Start Time 8588    PT Stop Time 5027    PT Time Calculation (min) 23 min    Activity Tolerance Patient tolerated treatment well    Behavior During Therapy [facility] for tasks assessed/performed             Past Medical History:  Diagnosis Date   Allergic rhinitis 07/30/2012   Allergy    Anxiety 03/06/2014   Chronic neck pain 09/06/2012   Colon polyps    Endometriosis    Family history of colon cancer 12/29/2016   Family history of polyps in the colon    History of pyelonephritis 07/30/2012   Hypothyroidism 03/06/2014   IBS (irritable bowel syndrome) 12/29/2016   Insomnia 08/19/2011   Pneumonia    hx of 2009    PONV (postoperative nausea and vomiting)    Post menopausal syndrome 03/06/2014    Past Surgical History:  Procedure Laterality Date   ABDOMINAL HYSTERECTOMY     cervical fusion C5-C7      CESAREAN SECTION     DILATION AND CURETTAGE OF UTERUS     GALLBLADDER SURGERY     KNEE ARTHROSCOPY WITH MEDIAL MENISECTOMY Left 04/30/2018   Procedure: LEFT KNEE ARTHROSCOPY WITH DEBRIDEMENT;  Surgeon: Kara Rossetti, MD;  Location: [facility];  Service: Orthopedics;  Laterality: Left;   SPINE SURGERY      There were no vitals filed for this visit.   Subjective Assessment - 04/25/21 1556     Subjective Pt last seen 2 weeks ago. She has signficant less pain today. reports no pain. Has been moving, lifting/carrying boxes at home, and doing HEP without pain.    Currently in Pain? No/denies                                [facility] Adult PT Treatment/Exercise - 04/25/21 1554       Exercises   Exercises Shoulder      Shoulder Exercises: Supine   Horizontal ABduction 15 reps    Theraband Level (Shoulder Horizontal ABduction) Level 2 (Red)    Other Supine Exercises flys 2 lb x 15;    Other Supine Exercises Chest press 5 lb x 15;      Shoulder Exercises: Standing   Horizontal ABduction 20 reps    Theraband Level (Shoulder Horizontal ABduction) Level 2 (Red)    External Rotation 20 reps    Theraband Level (Shoulder External Rotation) Level 2 (Red)    Flexion AROM;10 reps    ABduction AROM;10 reps    Row 20 reps    Theraband Level (Shoulder Row) Level 3 (Green)    Other Standing Exercises wall push ups x 15;                     PT Education - 04/25/21 1557     Education Details Final HEP updated.    Person(s) Educated Patient  Methods Explanation;Demonstration;Tactile cues;Verbal cues;Handout    Comprehension Verbalized understanding;Returned demonstration;Verbal cues required;Tactile cues required;Need further instruction              PT Short Term Goals - 04/25/21 1558       PT SHORT TERM GOAL #1   Title Pt to be independent wtih initial HEP    Time 2    Status Achieved    Target Date 04/25/21               PT Long Term Goals - 04/25/21 1558       PT LONG TERM GOAL #1   Title Pt to be independent with final HEP    Time 6    Period Weeks    Status Achieved      PT LONG TERM GOAL #2   Title Pt to report no pain in R chest wall with UE movement , activity, IADLs.    Time 6    Period Weeks    Status Achieved                   Plan - 04/25/21 1558     Clinical Impression Statement Pt with no remainin pain today, with palpation or with movement/ther ex. Strengthening reviewed today, final HEP reviewed. Intercostal strain appears to have healed very well. She has full ROM, and good ability for push/pull/press  motions without pain. Pt has met goals at this time,and is ready for d/c to HEP, pt in agreement with plan.    Examination-Activity Limitations Sleep;Bend;Reach Overhead;Lift;Dressing;Carry    Examination-Participation Restrictions Cleaning;Community Activity;Laundry    Stability/Clinical Decision Making Stable/Uncomplicated    Rehab Potential Good    PT Frequency 2x / week    PT Duration 6 weeks    PT Treatment/Interventions ADLs/Self Care Home Management;Cryotherapy;Electrical Stimulation;Iontophoresis 50m/ml Dexamethasone;Moist Heat;DME Instruction;Functional mobility training;Therapeutic activities;Therapeutic exercise;Neuromuscular re-education;Manual techniques;Passive range of motion;Spinal Manipulations;Dry needling;Energy conservation;Taping;Vasopneumatic Device    PT Home Exercise Plan [email or web address]    Consulted and Agree with Plan of Care Patient             Patient will benefit from skilled therapeutic intervention in order to improve the following deficits and impairments:  Pain, Decreased mobility, Increased muscle spasms, Decreased range of motion, Decreased activity tolerance  Visit Diagnosis: Pain in thoracic spine     Problem List Patient Active Problem List   Diagnosis Date Noted   Genetic testing 05/20/2019   Acromioclavicular joint arthritis 02/11/2019   Arthritis of right shoulder region 11/29/2018   Attention deficit hyperactivity disorder (ADHD), combined type 11/02/2018   Cervical spondylosis 10/26/2017   Vitamin D deficiency 03/09/2017   IBS (irritable bowel syndrome) 12/29/2016   Family history of colon cancer 12/29/2016   Anxiety 03/06/2014   Hypothyroidism 03/06/2014   Post menopausal syndrome 03/06/2014   Allergic rhinitis 07/30/2012   Insomnia 08/19/2011   Kara Kennedy PT, DPT 4:02 PM  04/25/21    [place] [address]Phone: [phone]  Fax:   [phone] Name: Kara CARAVEOMRN: [id number] of Birth: [birth year] PHYSICAL THERAPY DISCHARGE SUMMARY  Visits from Start of Care: 2 Plan: Patient agrees to discharge.  Patient goals were met. Patient is being discharged due to meeting the stated rehab goals.     Kara Kennedy PT, DPT 4:02 PM  04/25/21

## 2021-04-25 NOTE — Patient Instructions (Signed)
Access Code: [email or web address] URL: [email or web address] Date: 04/25/2021 Prepared by: Lyndee Hensen  Exercises Standing Shoulder Flexion Full Range - 1 x daily - 1 sets - 10 reps Standing Shoulder Abduction Full Range - 1 x daily - 1 sets - 10 reps Standing Row with Anchored Resistance - 1 x daily - 2 sets - 10 reps Standing Shoulder External Rotation with Resistance - 1 x daily - 1-2 sets - 10 reps Standing Shoulder Horizontal Abduction with Resistance - 1 x daily - 1 sets - 10 reps Supine Shoulder Horizontal Abduction with Resistance - 1 x daily - 1 sets - 10 reps Tricep Push Up on Wall - 1 x daily - 1-2 sets - 10 reps

## 2021-04-25 NOTE — Therapy (Signed)
[place] [address] [place], [place], [address] Phone: [phone]   Fax:  [phone]  Physical Therapy Evaluation  Patient Details  Name: Kara Kennedy MRN: [id number] Date of Birth: 1956-04-06 Referring Provider (PT): Lynne Leader   Encounter Date: 04/11/2021   PT End of Session - 04/25/21 1547     Visit Number 1    Number of Visits 12    Date for PT Re-Evaluation 05/23/21    Authorization Type [facility]    PT Start Time 1520    PT Stop Time 2774    PT Time Calculation (min) 38 min    Activity Tolerance Patient tolerated treatment well    Behavior During Therapy [facility] for tasks assessed/performed             Past Medical History:  Diagnosis Date   Allergic rhinitis 07/30/2012   Allergy    Anxiety 03/06/2014   Chronic neck pain 09/06/2012   Colon polyps    Endometriosis    Family history of colon cancer 12/29/2016   Family history of polyps in the colon    History of pyelonephritis 07/30/2012   Hypothyroidism 03/06/2014   IBS (irritable bowel syndrome) 12/29/2016   Insomnia 08/19/2011   Pneumonia    hx of 2009    PONV (postoperative nausea and vomiting)    Post menopausal syndrome 03/06/2014    Past Surgical History:  Procedure Laterality Date   ABDOMINAL HYSTERECTOMY     cervical fusion C5-C7      CESAREAN SECTION     DILATION AND CURETTAGE OF UTERUS     GALLBLADDER SURGERY     KNEE ARTHROSCOPY WITH MEDIAL MENISECTOMY Left 04/30/2018   Procedure: LEFT KNEE ARTHROSCOPY WITH DEBRIDEMENT;  Surgeon: Mcarthur Rossetti, MD;  Location: [facility];  Service: Orthopedics;  Laterality: Left;   SPINE SURGERY      There were no vitals filed for this visit.    Subjective Assessment - 04/25/21 1545     Subjective Pt states increased pain when she was holding/cleaning a large window that fell. She heard pop at that time. Pain since end of aurgust. Does think pain is improving some, now thinks shes at about 80 %. Increased  pain, with Lifting,  coughing, sneezing. Pain R sided chest wall.  Does like to Exercise: walks. 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    Patient Stated Goals decreased pain    Currently in Pain? Yes    Pain Score 2     Pain Location Rib cage    Pain Orientation Right    Pain Descriptors / Indicators Aching    Pain Type Acute pain    Pain Onset More than a month ago    Pain Frequency Intermittent    Aggravating Factors  push/pull, straining, use of arm.                [facility] PT Assessment - 04/25/21 0001       Assessment   Medical Diagnosis R rib pain    Referring Provider (PT) Lynne Leader    Hand Dominance Left    Prior Therapy no      Precautions   Precautions None      Balance Screen   Has the patient fallen in the past 6 months No      Prior Function   Level of Independence Independent      Cognition   Overall Cognitive Status Within Functional Limits for tasks assessed  AROM   Overall AROM Comments R shoulder: mild hesitation and limitation for full elevation due to pain.      Strength   Overall Strength Comments shoulder: 4/5      Palpation   Palpation comment MIld tenderness in R anterior chest wall, ribs2-4 and pec                        Objective measurements completed on examination: See above findings.       [facility] Adult PT Treatment/Exercise - 04/25/21 1552       Exercises   Exercises Shoulder      Shoulder Exercises: Standing   Horizontal ABduction 10 reps    Theraband Level (Shoulder Horizontal ABduction) Level 2 (Red)    External Rotation 10 reps    Theraband Level (Shoulder External Rotation) Level 2 (Red)    Flexion AROM;10 reps    ABduction AROM;10 reps                     PT Education - 04/25/21 1547     Education Details PT POC, Exam findings, HEP    Person(s) Educated Patient    Methods Explanation;Demonstration;Tactile cues;Verbal cues;Handout    Comprehension Verbalized understanding;Returned  demonstration;Verbal cues required;Tactile cues required;Need further instruction              PT Short Term Goals - 04/25/21 1548       PT SHORT TERM GOAL #1   Title Pt to be independent wtih initial HEP    Time 2    Status New    Target Date 04/25/21               PT Long Term Goals - 04/25/21 1548       PT LONG TERM GOAL #1   Title Pt to be independent with final HEP    Time 6    Period Weeks    Status New    Target Date 05/23/21      PT LONG TERM GOAL #2   Title Pt to report no pain in R chest wall with UE movement , activity, IADLs.    Time 6    Period Weeks    Status New    Target Date 05/23/21                    Plan - 04/25/21 1550     Clinical Impression Statement Pt presents with primary complaint of increased pain in R sided chest wall/ribs. Pt with mild decrease in ability for ROM in t-spine and shoulder due to apprehension and slight pain. Pt with mild tenderness, that is improved quite a bit since incident. Pt able to perform shoulder ROM and light strengthening today without increased pain. Discussed continuing to limit activity for continued healing and decreased pain. Pt limited ability for full functional activiites and work duties due to pain. Pt to benefit from skilled PT to improve deficts and pain.    Examination-Activity Limitations Sleep;Bend;Reach Overhead;Lift;Dressing;Carry    Examination-Participation Restrictions Cleaning;Community Activity;Laundry    Stability/Clinical Decision Making Stable/Uncomplicated    Clinical Decision Making Low    Rehab Potential Good    PT Frequency 2x / week    PT Duration 6 weeks    PT Treatment/Interventions ADLs/Self Care Home Management;Cryotherapy;Electrical Stimulation;Iontophoresis 4mg /ml Dexamethasone;Moist Heat;DME Instruction;Functional mobility training;Therapeutic activities;Therapeutic exercise;Neuromuscular re-education;Manual techniques;Passive range of motion;Spinal  Manipulations;Dry needling;Energy conservation;Taping;Vasopneumatic Device    PT Home Exercise Plan [email or web address]  Consulted and Agree with Plan of Care Patient             Patient will benefit from skilled therapeutic intervention in order to improve the following deficits and impairments:  Pain, Decreased mobility, Increased muscle spasms, Decreased range of motion, Decreased activity tolerance  Visit Diagnosis: Pain in thoracic spine     Problem List Patient Active Problem List   Diagnosis Date Noted   Genetic testing 05/20/2019   Acromioclavicular joint arthritis 02/11/2019   Arthritis of right shoulder region 11/29/2018   Attention deficit hyperactivity disorder (ADHD), combined type 11/02/2018   Cervical spondylosis 10/26/2017   Vitamin D deficiency 03/09/2017   IBS (irritable bowel syndrome) 12/29/2016   Family history of colon cancer 12/29/2016   Anxiety 03/06/2014   Hypothyroidism 03/06/2014   Post menopausal syndrome 03/06/2014   Allergic rhinitis 07/30/2012   Insomnia 08/19/2011   Lyndee Hensen, PT, DPT 3:53 PM  04/25/21    [place] [address] [place], [place], [address] Phone: [phone]   Fax:  [phone]  Name: Kara Kennedy MRN: [id number] Date of Birth: 1956-05-12

## 2021-04-26 ENCOUNTER — Ambulatory Visit: Payer: 59 | Admitting: Family Medicine

## 2021-04-26 NOTE — Progress Notes (Deleted)
   I, Peterson Lombard, LAT, ATC acting as a scribe for Lynne Leader, MD.  Kara Kennedy is a 65 y.o. female who presents to [place] at [facility] today for f/u sternoclavicular pain that began after cleaning a window when she heard a "pop" in her chest.  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 She was last seen by Dr. Georgina Snell on 03/22/21 and was advised to wear a binding, Tylenol, heating pad, and tizanidine, as well as continuing her meloxicam. Pt was also advised to start PT 1-2 after the visit, of which she's completed 2 visits. Today, pt reports  Dx imaging: 03/22/21 Chest XR  Pertinent review of systems: ***  Relevant historical information: ***   Exam:  LMP  (LMP Unknown)  General: Well Developed, well nourished, and in no acute distress.   MSK: ***    Lab and Radiology Results No results found for this or any previous visit (from the past 72 hour(s)). No results found.     Assessment and Plan: 65 y.o. female with ***   PDMP not reviewed this encounter. No orders of the defined types were placed in this encounter.  No orders of the defined types were placed in this encounter.    Discussed warning signs or symptoms. Please see discharge instructions. Patient expresses understanding.   ***

## 2021-04-30 ENCOUNTER — Other Ambulatory Visit: Payer: Self-pay

## 2021-04-30 ENCOUNTER — Encounter: Payer: Self-pay | Admitting: Family Medicine

## 2021-04-30 ENCOUNTER — Other Ambulatory Visit (HOSPITAL_COMMUNITY): Payer: Self-pay

## 2021-04-30 MED ORDER — ESTRADIOL 1 MG PO TABS
1.0000 mg | ORAL_TABLET | Freq: Every day | ORAL | 3 refills | Status: DC
  Filled 2021-04-30: qty 90, 90d supply, fill #0
  Filled 2021-07-31: qty 90, 90d supply, fill #1
  Filled 2021-10-31: qty 90, 90d supply, fill #2
  Filled 2022-01-28: qty 90, 90d supply, fill #3

## 2021-05-01 ENCOUNTER — Other Ambulatory Visit (HOSPITAL_COMMUNITY): Payer: Self-pay

## 2021-05-03 ENCOUNTER — Encounter: Payer: Self-pay | Admitting: Podiatry

## 2021-05-03 ENCOUNTER — Ambulatory Visit: Payer: 59 | Admitting: Podiatry

## 2021-05-03 ENCOUNTER — Other Ambulatory Visit: Payer: Self-pay

## 2021-05-03 DIAGNOSIS — L600 Ingrowing nail: Secondary | ICD-10-CM | POA: Diagnosis not present

## 2021-05-03 NOTE — Progress Notes (Signed)
Subjective:  Patient ID: Kara Kennedy, female    DOB: 01/20/1956,  MRN: [id number]  Chief Complaint  Patient presents with   Ingrown Toenail    Left hallux possible ingrown     65 y.o. female presents with the above complaint.  Patient presents with complaint left lateral border ingrown.  Patient states painful to touch.  No drainage.  Patient tried self debridement none of which has helped.  She would like to have removed.  She had a history of ingrown in the past that was removed.  She denies any other acute complaints she has not seen anyone else prior to seeing me.  Pain scale is 8 out of 10 hurts with ambulation   Review of Systems: Negative except as noted in the HPI. Denies N/V/F/Ch.  Past Medical History:  Diagnosis Date   Allergic rhinitis 07/30/2012   Allergy    Anxiety 03/06/2014   Chronic neck pain 09/06/2012   Colon polyps    Endometriosis    Family history of colon cancer 12/29/2016   Family history of polyps in the colon    History of pyelonephritis 07/30/2012   Hypothyroidism 03/06/2014   IBS (irritable bowel syndrome) 12/29/2016   Insomnia 08/19/2011   Pneumonia    hx of 2009    PONV (postoperative nausea and vomiting)    Post menopausal syndrome 03/06/2014    Current Outpatient Medications:    ALPRAZolam (XANAX) 0.5 MG tablet, Take by mouth., Disp: , Rfl:    cholecalciferol (VITAMIN D) 1000 units tablet, Take 1,000 Units by mouth daily., Disp: , Rfl:    estradiol (ESTRACE) 1 MG tablet, Take 1 tablet (1 mg total) by mouth daily., Disp: 90 tablet, Rfl: 3   gabapentin (NEURONTIN) 300 MG capsule, TAKE 3 CAPSULES BY MOUTH AT BEDTIME., Disp: 270 capsule, Rfl: 2   HYDROcodone-acetaminophen (NORCO/VICODIN) 5-325 MG tablet, Take 1 tablet by mouth every 6 hours as needed for moderate pain., Disp: 12 tablet, Rfl: 0   linaclotide (LINZESS) 290 MCG CAPS capsule, TAKE 1 CAPSULE BY MOUTH ONCE DAILY BEFORE BREAKFAST, Disp: 90 capsule, Rfl: 3   lisdexamfetamine (VYVANSE) 50 MG  capsule, TAKE 1 CAPSULE (50 MG TOTAL) BY MOUTH DAILY., Disp: 90 capsule, Rfl: 0   loratadine (CLARITIN) 10 MG tablet, Take 10 mg by mouth daily., Disp: , Rfl:    meloxicam (MOBIC) 15 MG tablet, TAKE 1 TABLET (15 MG TOTAL) BY MOUTH DAILY., Disp: 90 tablet, Rfl: 1   montelukast (SINGULAIR) 10 MG tablet, TAKE 1 TABLET BY MOUTH AT BEDTIME., Disp: 90 tablet, Rfl: 3   predniSONE (DELTASONE) 50 MG tablet, Take 1 tablet daily for 5 days., Disp: 5 tablet, Rfl: 0   SYNTHROID 88 MCG tablet, TAKE 1 TABLET BY MOUTH EVERY MORNING., Disp: 90 tablet, Rfl: 3   tiZANidine (ZANAFLEX) 2 MG tablet, Take 1-2 tablets (2-4 mg total) by mouth every 8 (eight) hours as needed for muscle spasms., Disp: 60 tablet, Rfl: 1   traZODone (DESYREL) 50 MG tablet, TAKE 1/2-1 TABLETS BY MOUTH AT BEDTIME AS NEEDED FOR SLEEP., Disp: 90 tablet, Rfl: 2  Social History   Tobacco Use  Smoking Status Former  Smokeless Tobacco Never  Tobacco Comments   Quit 1999    Allergies  Allergen Reactions   Depo-Medrol [Methylprednisolone Sodium Succ] Swelling    After knee injection   Sulfamethoxazole-Trimethoprim    Objective:  There were no vitals filed for this visit. There is no height or weight on file to calculate BMI. Constitutional Well developed.  Well nourished.  Vascular Dorsalis pedis pulses palpable bilaterally. Posterior tibial pulses palpable bilaterally. Capillary refill normal to all digits.  No cyanosis or clubbing noted. Pedal hair growth normal.  Neurologic Normal speech. Oriented to person, place, and time. Epicritic sensation to light touch grossly present bilaterally.  Dermatologic Painful ingrowing nail at lateral nail borders of the hallux nail left. No other open wounds. No skin lesions.  Orthopedic: Normal joint ROM without pain or crepitus bilaterally. No visible deformities. No bony tenderness.   Radiographs: None Assessment:   1. Ingrown left big toenail    Plan:  Patient was evaluated and  treated and all questions answered.  Ingrown Nail, left -Patient elects to proceed with minor surgery to remove ingrown toenail removal today. Consent reviewed and signed by patient. -Ingrown nail excised. See procedure note. -Educated on post-procedure care including soaking. Written instructions provided and reviewed. -Patient to follow up in 2 weeks for nail check.  Procedure: Excision of Ingrown Toenail Location: Left 1st toe lateral nail borders. Anesthesia: Lidocaine 1% plain; 1.5 mL and Marcaine 0.5% plain; 1.5 mL, digital block. Skin Prep: Betadine. Dressing: Silvadene; telfa; dry, sterile, compression dressing. Technique: Following skin prep, the toe was exsanguinated and a tourniquet was secured at the base of the toe. The affected nail border was freed, split with a nail splitter, and excised. Chemical matrixectomy was then performed with phenol and irrigated out with alcohol. The tourniquet was then removed and sterile dressing applied. Disposition: Patient tolerated procedure well. Patient to return in 2 weeks for follow-up.   No follow-ups on file.

## 2021-05-07 ENCOUNTER — Other Ambulatory Visit (HOSPITAL_COMMUNITY): Payer: Self-pay

## 2021-05-16 ENCOUNTER — Other Ambulatory Visit (HOSPITAL_COMMUNITY): Payer: Self-pay

## 2021-05-23 ENCOUNTER — Other Ambulatory Visit: Payer: Self-pay | Admitting: Family Medicine

## 2021-05-23 ENCOUNTER — Other Ambulatory Visit (HOSPITAL_COMMUNITY): Payer: Self-pay

## 2021-05-23 DIAGNOSIS — F5102 Adjustment insomnia: Secondary | ICD-10-CM

## 2021-05-23 DIAGNOSIS — G8929 Other chronic pain: Secondary | ICD-10-CM

## 2021-05-23 DIAGNOSIS — M546 Pain in thoracic spine: Secondary | ICD-10-CM

## 2021-05-23 MED FILL — Montelukast Sodium Tab 10 MG (Base Equiv): ORAL | 90 days supply | Qty: 90 | Fill #2 | Status: AC

## 2021-05-23 MED FILL — Meloxicam Tab 15 MG: ORAL | 90 days supply | Qty: 90 | Fill #1 | Status: AC

## 2021-05-23 MED FILL — Levothyroxine Sodium Tab 88 MCG: ORAL | 90 days supply | Qty: 90 | Fill #2 | Status: AC

## 2021-05-23 MED FILL — Linaclotide Cap 290 MCG: ORAL | 90 days supply | Qty: 90 | Fill #2 | Status: AC

## 2021-05-24 ENCOUNTER — Other Ambulatory Visit (HOSPITAL_COMMUNITY): Payer: Self-pay

## 2021-05-26 ENCOUNTER — Encounter: Payer: Self-pay | Admitting: Podiatry

## 2021-05-28 ENCOUNTER — Other Ambulatory Visit (HOSPITAL_COMMUNITY): Payer: Self-pay

## 2021-05-28 ENCOUNTER — Other Ambulatory Visit: Payer: Self-pay | Admitting: Family Medicine

## 2021-05-28 DIAGNOSIS — G8929 Other chronic pain: Secondary | ICD-10-CM

## 2021-05-28 DIAGNOSIS — F5102 Adjustment insomnia: Secondary | ICD-10-CM

## 2021-05-28 DIAGNOSIS — M546 Pain in thoracic spine: Secondary | ICD-10-CM

## 2021-05-29 ENCOUNTER — Other Ambulatory Visit (HOSPITAL_COMMUNITY): Payer: Self-pay

## 2021-05-29 MED ORDER — GABAPENTIN 300 MG PO CAPS
ORAL_CAPSULE | Freq: Every day | ORAL | 2 refills | Status: DC
  Filled 2021-05-29: qty 270, 90d supply, fill #0
  Filled 2021-08-29: qty 270, 90d supply, fill #1
  Filled 2021-12-02: qty 270, 90d supply, fill #2

## 2021-05-29 MED ORDER — TRAZODONE HCL 50 MG PO TABS
ORAL_TABLET | ORAL | 2 refills | Status: DC
  Filled 2021-05-29: qty 90, 90d supply, fill #0
  Filled 2021-10-31: qty 90, 90d supply, fill #1
  Filled 2022-01-28: qty 90, 90d supply, fill #2

## 2021-05-31 ENCOUNTER — Other Ambulatory Visit: Payer: Self-pay

## 2021-05-31 ENCOUNTER — Ambulatory Visit: Payer: 59 | Admitting: Podiatry

## 2021-05-31 ENCOUNTER — Encounter: Payer: Self-pay | Admitting: Podiatry

## 2021-05-31 DIAGNOSIS — L600 Ingrowing nail: Secondary | ICD-10-CM | POA: Diagnosis not present

## 2021-05-31 NOTE — Progress Notes (Signed)
Subjective:  Patient ID: Kara Kennedy, female    DOB: [birth year],  MRN: [id number]  Chief Complaint  Patient presents with   Ingrown Toenail    Left hallux    65 y.o. female presents with the above complaint.  Patient presents with new complaint left hallux medial border ingrown.  Patient states that the medial side started to hurt the lateral side was doing really well.  She would like to have the medial side removed.  She denies any other acute complaints.  She is aware of the risk and complication   Review of Systems: Negative except as noted in the HPI. Denies N/V/F/Ch.  Past Medical History:  Diagnosis Date   Allergic rhinitis 07/30/2012   Allergy    Anxiety 03/06/2014   Chronic neck pain 09/06/2012   Colon polyps    Endometriosis    Family history of colon cancer 12/29/2016   Family history of polyps in the colon    History of pyelonephritis 07/30/2012   Hypothyroidism 03/06/2014   IBS (irritable bowel syndrome) 12/29/2016   Insomnia 08/19/2011   Pneumonia    hx of 2009    PONV (postoperative nausea and vomiting)    Post menopausal syndrome 03/06/2014    Current Outpatient Medications:    gabapentin (NEURONTIN) 300 MG capsule, TAKE 3 CAPSULES BY MOUTH AT BEDTIME., Disp: 270 capsule, Rfl: 2   traZODone (DESYREL) 50 MG tablet, TAKE 1/2-1 TABLET BY MOUTH AT BEDTIME AS NEEDED FOR SLEEP., Disp: 90 tablet, Rfl: 2   ALPRAZolam (XANAX) 0.5 MG tablet, Take by mouth., Disp: , Rfl:    cholecalciferol (VITAMIN D) 1000 units tablet, Take 1,000 Units by mouth daily., Disp: , Rfl:    estradiol (ESTRACE) 1 MG tablet, Take 1 tablet (1 mg total) by mouth daily., Disp: 90 tablet, Rfl: 3   HYDROcodone-acetaminophen (NORCO/VICODIN) 5-325 MG tablet, Take 1 tablet by mouth every 6 hours as needed for moderate pain., Disp: 12 tablet, Rfl: 0   linaclotide (LINZESS) 290 MCG CAPS capsule, TAKE 1 CAPSULE BY MOUTH ONCE DAILY BEFORE BREAKFAST, Disp: 90 capsule, Rfl: 3   lisdexamfetamine (VYVANSE) 50 MG  capsule, TAKE 1 CAPSULE (50 MG TOTAL) BY MOUTH DAILY., Disp: 90 capsule, Rfl: 0   loratadine (CLARITIN) 10 MG tablet, Take 10 mg by mouth daily., Disp: , Rfl:    meloxicam (MOBIC) 15 MG tablet, TAKE 1 TABLET (15 MG TOTAL) BY MOUTH DAILY., Disp: 90 tablet, Rfl: 1   montelukast (SINGULAIR) 10 MG tablet, TAKE 1 TABLET BY MOUTH AT BEDTIME., Disp: 90 tablet, Rfl: 3   predniSONE (DELTASONE) 50 MG tablet, Take 1 tablet daily for 5 days., Disp: 5 tablet, Rfl: 0   SYNTHROID 88 MCG tablet, TAKE 1 TABLET BY MOUTH EVERY MORNING., Disp: 90 tablet, Rfl: 3   tiZANidine (ZANAFLEX) 2 MG tablet, Take 1-2 tablets (2-4 mg total) by mouth every 8 (eight) hours as needed for muscle spasms., Disp: 60 tablet, Rfl: 1  Social History   Tobacco Use  Smoking Status Former  Smokeless Tobacco Never  Tobacco Comments   Quit 1999    Allergies  Allergen Reactions   Depo-Medrol [Methylprednisolone Sodium Succ] Swelling    After knee injection   Sulfamethoxazole-Trimethoprim    Objective:  There were no vitals filed for this visit. There is no height or weight on file to calculate BMI. Constitutional Well developed. Well nourished.  Vascular Dorsalis pedis pulses palpable bilaterally. Posterior tibial pulses palpable bilaterally. Capillary refill normal to all digits.  No cyanosis or clubbing  noted. Pedal hair growth normal.  Neurologic Normal speech. Oriented to person, place, and time. Epicritic sensation to light touch grossly present bilaterally.  Dermatologic Painful ingrowing nail at medial nail borders of the hallux nail left. No other open wounds. No skin lesions.  Orthopedic: Normal joint ROM without pain or crepitus bilaterally. No visible deformities. No bony tenderness.   Radiographs: None Assessment:   1. Ingrown left big toenail     Plan:  Patient was evaluated and treated and all questions answered.  Ingrown Nail, left -Patient elects to proceed with minor surgery to remove ingrown  toenail removal today. Consent reviewed and signed by patient. -Ingrown nail excised. See procedure note. -Educated on post-procedure care including soaking. Written instructions provided and reviewed. -Patient to follow up in 2 weeks for nail check.  Procedure: Excision of Ingrown Toenail Location: Left 1st toe medial nail borders. Anesthesia: Lidocaine 1% plain; 1.5 mL and Marcaine 0.5% plain; 1.5 mL, digital block. Skin Prep: Betadine. Dressing: Silvadene; telfa; dry, sterile, compression dressing. Technique: Following skin prep, the toe was exsanguinated and a tourniquet was secured at the base of the toe. The affected nail border was freed, split with a nail splitter, and excised. Chemical matrixectomy was then performed with phenol and irrigated out with alcohol. The tourniquet was then removed and sterile dressing applied. Disposition: Patient tolerated procedure well. Patient to return in 2 weeks for follow-up.   No follow-ups on file.

## 2021-06-07 IMAGING — MR MRI OF THE RIGHT SHOULDER WITHOUT CONTRAST
4 of 5 series · 26 of 40 positions shown · non-contrast
Comparison: 10/26/2017 radiograph

CLINICAL DATA: Right neck pain radiating into the right shoulder
and arm. Weakness for 2 years.

EXAM:
MRI OF THE RIGHT SHOULDER WITHOUT CONTRAST
TECHNIQUE: Multiplanar, multisequence MR imaging of the shoulder was performed.
No intravenous contrast was administered.

[Series 3: PD fat-sat · axial · 4.0mm · 0.27mm/px · z∈[-5,+86]mm · 8 of 20 slices shown (1 of 2)]
[im 1/20]
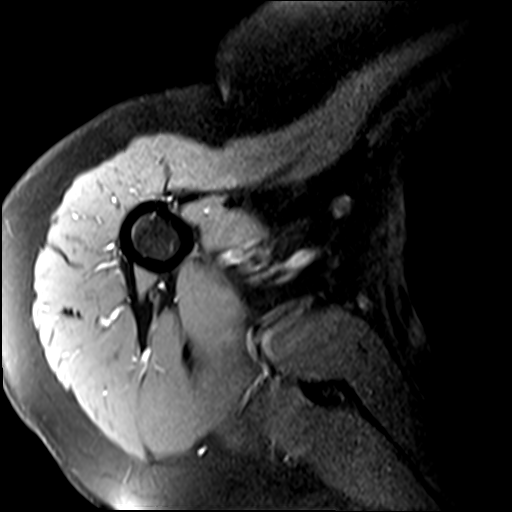
[im 3/20]
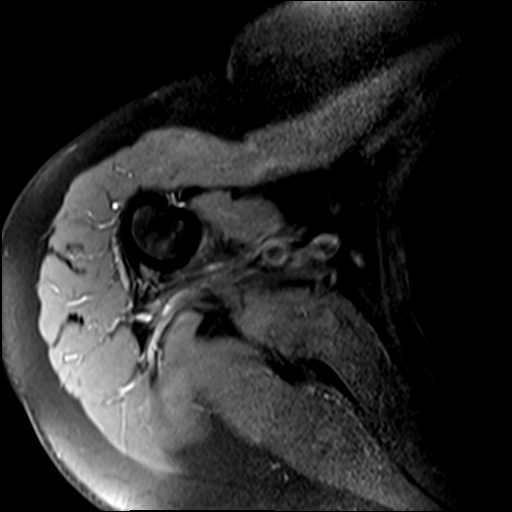
[im 6/20]
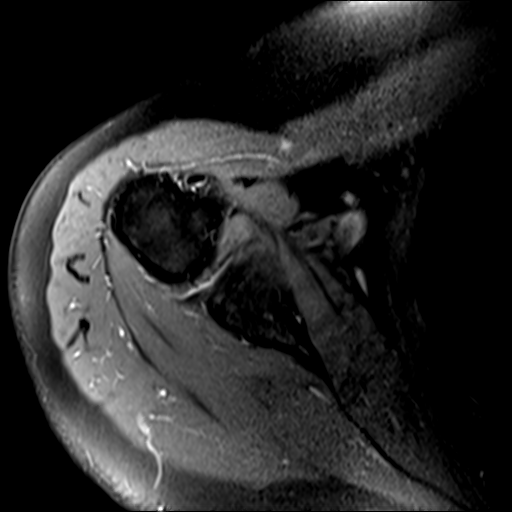
[im 9/20]
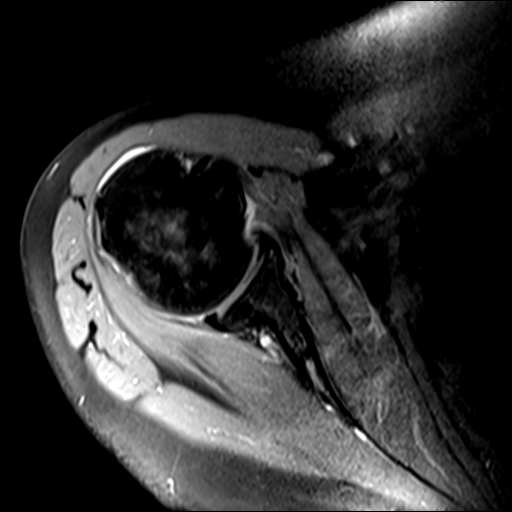
[im 11/20]
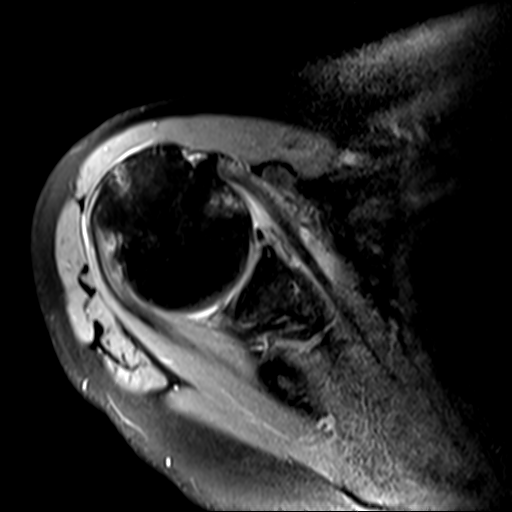
[im 14/20]
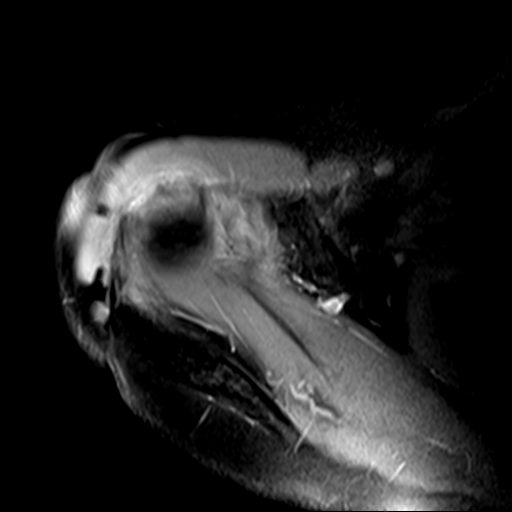
[im 17/20]
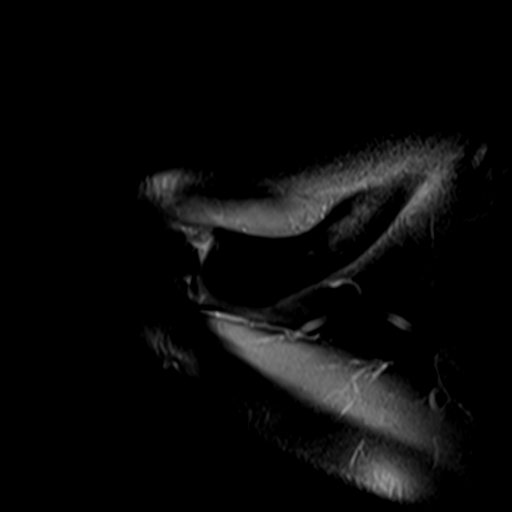
[im 20/20]
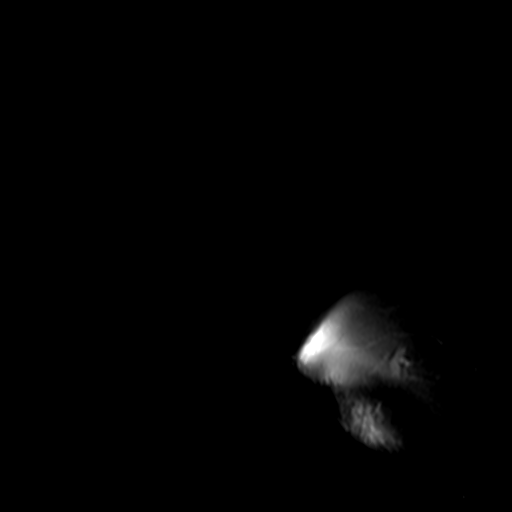

[Series 4: T2 fat-sat · oblique · 4.0mm · 0.55mm/px · 7 of 18 slices shown (1 of 2)]
[im 1/18]
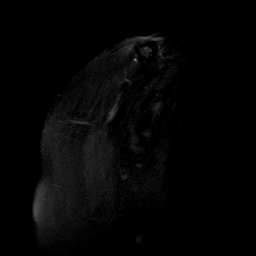
[im 3/18]
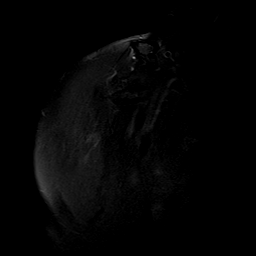
[im 5/18]
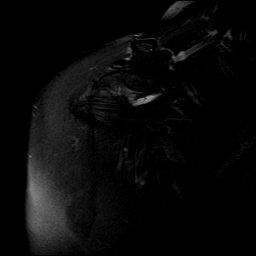
[im 8/18]
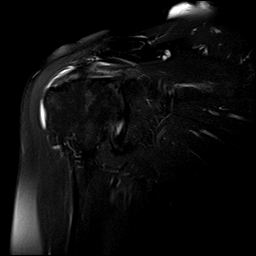
[im 10/18]
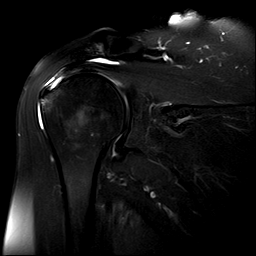
[im 13/18]
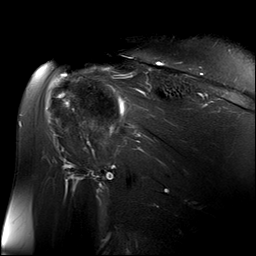
[im 15/18]
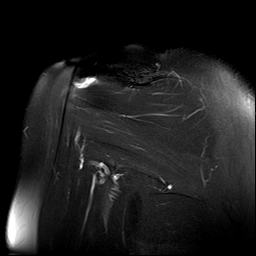

[Series 5: PD fat-sat · oblique · 4.0mm · 0.27mm/px · 8 of 18 slices shown (2 of 2)]
[im 1/18]
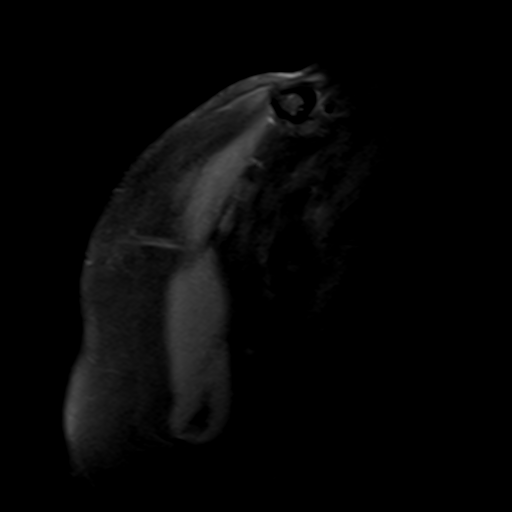
[im 3/18]
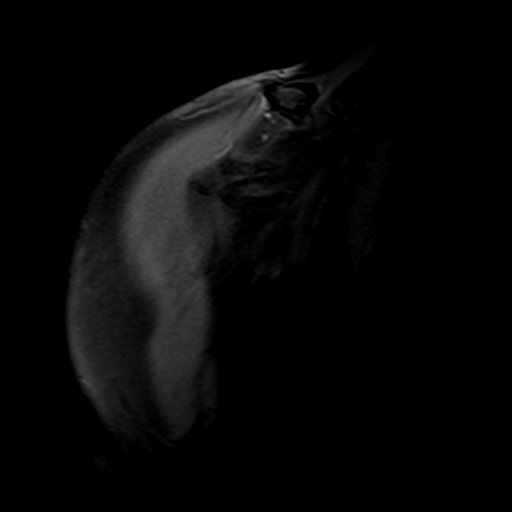
[im 5/18]
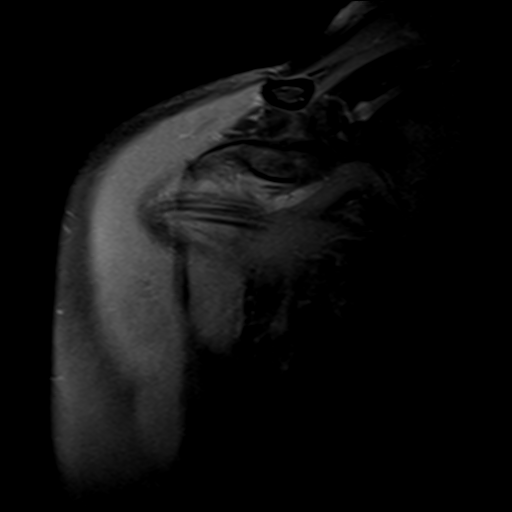
[im 8/18]
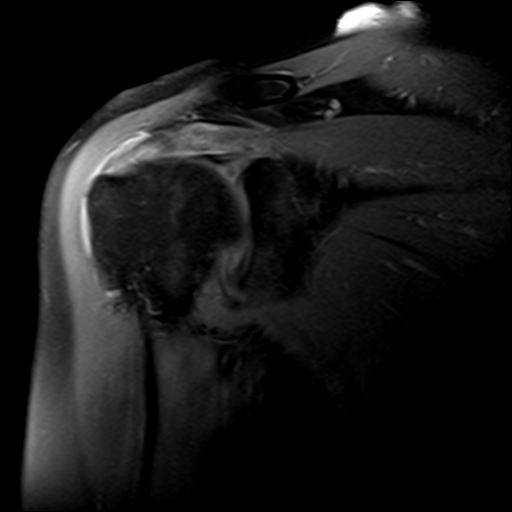
[im 10/18]
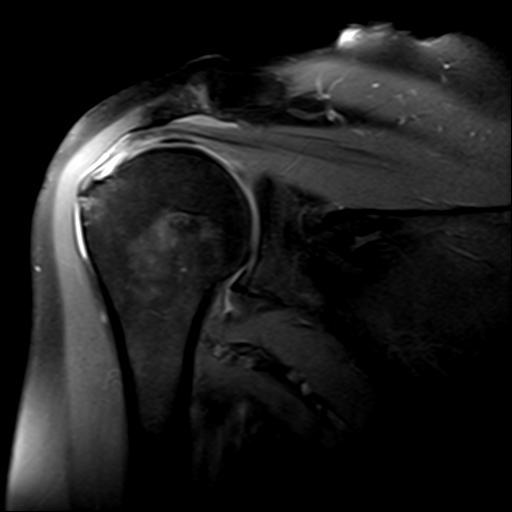
[im 13/18]
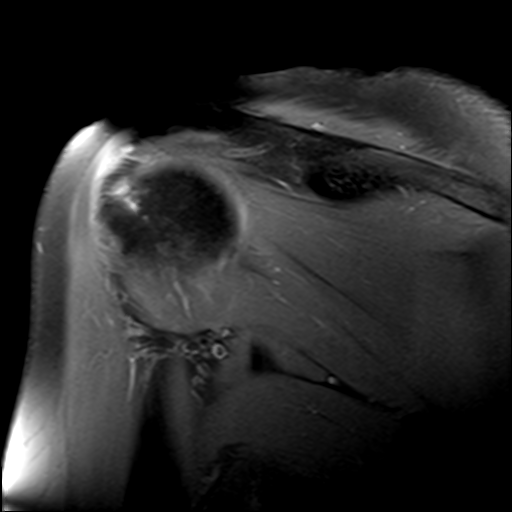
[im 15/18]
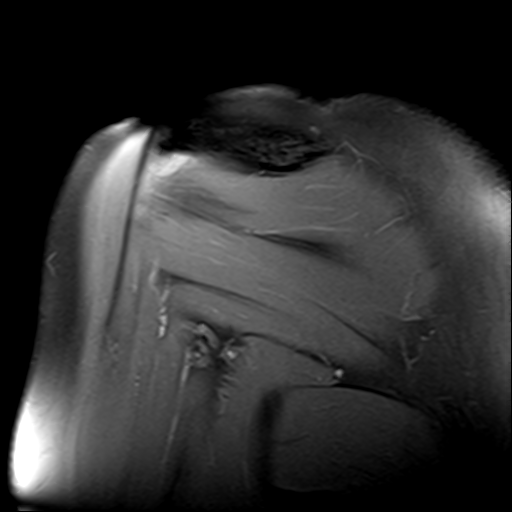
[im 18/18]
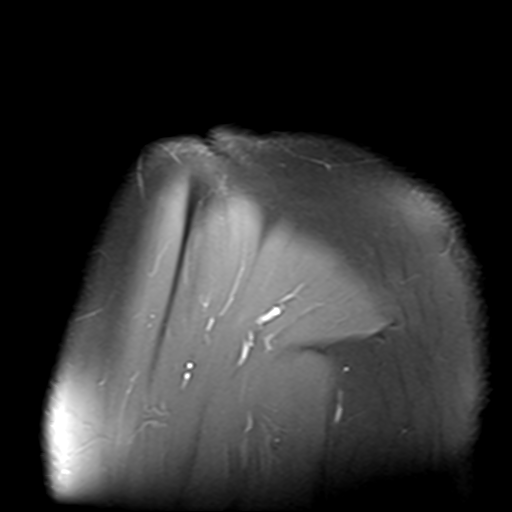

[Series 6: T2 fat-sat · oblique · 4.0mm · 0.55mm/px · 3 of 19 slices shown (2 of 2)]
[im 3/19]
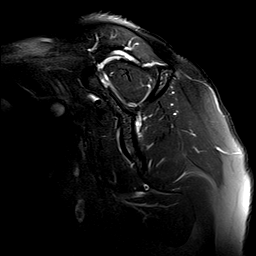
[im 11/19]
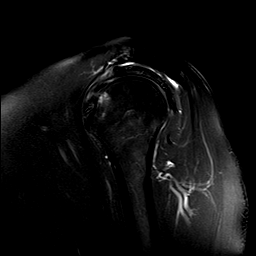
[im 16/19]
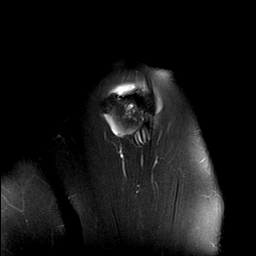

[26 of 40 positions shown; findings below may reference images not displayed]

FINDINGS: Rotator cuff: Moderate supraspinatus, subscapularis, and
infraspinatus tendinopathy. Prominent partial thickness articular
surface tearing of the distal supraspinatus tendon on image [DATE],
potentially with adjacent bursal surface irregularity but with what
appears to be a thin flat mantle of intact tendon separating the
bursal and articular surface tears, accordingly this is not
considered a full-thickness tear.

Muscles:  Unremarkable

Biceps long head:  Unremarkable

Acromioclavicular Joint: No significant AC joint arthropathy. Type
II acromion. Mild subacromial subdeltoid bursitis.

Glenohumeral Joint: Mild degenerative chondral thinning. Otherwise
unremarkable.

Labrum:  Grossly unremarkable

Bones: No significant extra-articular osseous abnormalities
identified.

Other: No supplemental non-categorized findings.
IMPRESSION: 1. Prominent partial thickness tearing of the distal supraspinatus,
with only a thin mantle of intact tendon along the primarily
articular surface partial tearing.
2. Moderate supraspinatus, subscapularis, and infraspinatus
tendinopathy.
3. Mild subacromial subdeltoid bursitis.
4. Mild degenerative chondral thinning in the glenohumeral joint.

## 2021-06-17 ENCOUNTER — Encounter: Payer: Self-pay | Admitting: Podiatry

## 2021-06-18 ENCOUNTER — Other Ambulatory Visit: Payer: Self-pay

## 2021-06-18 ENCOUNTER — Ambulatory Visit (INDEPENDENT_AMBULATORY_CARE_PROVIDER_SITE_OTHER): Payer: 59 | Admitting: Family Medicine

## 2021-06-18 ENCOUNTER — Encounter: Payer: Self-pay | Admitting: Family Medicine

## 2021-06-18 ENCOUNTER — Other Ambulatory Visit (HOSPITAL_COMMUNITY): Payer: Self-pay

## 2021-06-18 VITALS — BP 112/75 | HR 74 | Temp 98.4°F | Ht 63.0 in | Wt 138.4 lb

## 2021-06-18 DIAGNOSIS — Z23 Encounter for immunization: Secondary | ICD-10-CM

## 2021-06-18 DIAGNOSIS — F902 Attention-deficit hyperactivity disorder, combined type: Secondary | ICD-10-CM

## 2021-06-18 DIAGNOSIS — J309 Allergic rhinitis, unspecified: Secondary | ICD-10-CM

## 2021-06-18 DIAGNOSIS — F5102 Adjustment insomnia: Secondary | ICD-10-CM

## 2021-06-18 DIAGNOSIS — F419 Anxiety disorder, unspecified: Secondary | ICD-10-CM | POA: Diagnosis not present

## 2021-06-18 DIAGNOSIS — E559 Vitamin D deficiency, unspecified: Secondary | ICD-10-CM

## 2021-06-18 DIAGNOSIS — Z131 Encounter for screening for diabetes mellitus: Secondary | ICD-10-CM | POA: Diagnosis not present

## 2021-06-18 DIAGNOSIS — Z13220 Encounter for screening for lipoid disorders: Secondary | ICD-10-CM | POA: Diagnosis not present

## 2021-06-18 DIAGNOSIS — Z0001 Encounter for general adult medical examination with abnormal findings: Secondary | ICD-10-CM | POA: Diagnosis not present

## 2021-06-18 DIAGNOSIS — N951 Menopausal and female climacteric states: Secondary | ICD-10-CM | POA: Diagnosis not present

## 2021-06-18 DIAGNOSIS — E032 Hypothyroidism due to medicaments and other exogenous substances: Secondary | ICD-10-CM | POA: Diagnosis not present

## 2021-06-18 MED ORDER — LISDEXAMFETAMINE DIMESYLATE 50 MG PO CAPS
ORAL_CAPSULE | Freq: Every day | ORAL | 0 refills | Status: DC
  Filled 2021-06-18: qty 90, 90d supply, fill #0

## 2021-06-18 NOTE — Assessment & Plan Note (Signed)
Check vitamin D. 

## 2021-06-18 NOTE — Assessment & Plan Note (Signed)
Worsened recently.  Has been under quite a bit of stress at home and at work.  She lives in the process of building a new house.  Symptoms are currently manageable however she would like to be referred to a counselor.  We will place referral today.  She deferred medication changes.

## 2021-06-18 NOTE — Patient Instructions (Addendum)
It was very nice to see you today!  We will check blood work today.  We will give your pneumonia and shingles shot.  I will refer you to see a psychologist.  We will see you back in 6 months for medication check.  Please come back sooner if needed.  Take care, Dr Jerline Pain  PLEASE NOTE:  If you had any lab tests please let us know if you have not heard back within a few days. You may see your results on mychart before we have a chance to review them but we will give you a call once they are reviewed by [place]. If we ordered any referrals today, please let us know if you have not heard from their office within the next week.   Please try these tips to maintain a healthy lifestyle:  Eat at least 3 REAL meals and 1-2 snacks per day.  Aim for no more than 5 hours between eating.  If you eat breakfast, please do so within one hour of getting up.   Each meal should contain half fruits/vegetables, one quarter protein, and one quarter carbs (no bigger than a computer mouse)  Cut down on sweet beverages. This includes juice, soda, and sweet tea.   Drink at least 1 glass of water with each meal and aim for at least 8 glasses per day  Exercise at least 150 minutes every week.    Preventive Care 18 Years and Older, Female Preventive care refers to lifestyle choices and visits with your health care provider that can promote health and wellness. Preventive care visits are also called wellness exams. What can I expect for my preventive care visit? Counseling Your health care provider may ask you questions about your: Medical history, including: Past medical problems. Family medical history. Pregnancy and menstrual history. History of falls. Current health, including: Memory and ability to understand (cognition). Emotional well-being. Home life and relationship well-being. Sexual activity and sexual health. Lifestyle, including: Alcohol, nicotine or tobacco, and drug use. Access to  firearms. Diet, exercise, and sleep habits. Work and work Statistician. Sunscreen use. Safety issues such as seatbelt and bike helmet use. Physical exam Your health care provider will check your: Height and weight. These may be used to calculate your BMI (body mass index). BMI is a measurement that tells if you are at a healthy weight. Waist circumference. This measures the distance around your waistline. This measurement also tells if you are at a healthy weight and may help predict your risk of certain diseases, such as type 2 diabetes and high blood pressure. Heart rate and blood pressure. Body temperature. Skin for abnormal spots. What immunizations do I need? Vaccines are usually given at various ages, according to a schedule. Your health care provider will recommend vaccines for you based on your age, medical history, and lifestyle or other factors, such as travel or where you work. What tests do I need? Screening Your health care provider may recommend screening tests for certain conditions. This may include: Lipid and cholesterol levels. Hepatitis C test. Hepatitis B test. HIV (human immunodeficiency virus) test. STI (sexually transmitted infection) testing, if you are at risk. Lung cancer screening. Colorectal cancer screening. Diabetes screening. This is done by checking your blood sugar (glucose) after you have not eaten for a while (fasting). Mammogram. Talk with your health care provider about how often you should have regular mammograms. BRCA-related cancer screening. This may be done if you have a family history of breast, ovarian, tubal, or  peritoneal cancers. Bone density scan. This is done to screen for osteoporosis. Talk with your health care provider about your test results, treatment options, and if necessary, the need for more tests. Follow these instructions at home: Eating and drinking  Eat a diet that includes fresh fruits and vegetables, whole grains, lean  protein, and low-fat dairy products. Limit your intake of foods with high amounts of sugar, saturated fats, and salt. Take vitamin and mineral supplements as recommended by your health care provider. Do not drink alcohol if your health care provider tells you not to drink. If you drink alcohol: Limit how much you have to 0-1 drink a day. Know how much alcohol is in your drink. In the [place] one drink equals one 12 oz bottle of beer (355 mL), one 5 oz glass of wine (148 mL), or one 1 oz glass of hard liquor (44 mL). Lifestyle Brush your teeth every morning and night with fluoride toothpaste. Floss one time each day. Exercise for at least 30 minutes 5 or more days each week. Do not use any products that contain nicotine or tobacco. These products include cigarettes, chewing tobacco, and vaping devices, such as e-cigarettes. If you need help quitting, ask your health care provider. Do not use drugs. If you are sexually active, practice safe sex. Use a condom or other form of protection in order to prevent STIs. Take aspirin only as told by your health care provider. Make sure that you understand how much to take and what form to take. Work with your health care provider to find out whether it is safe and beneficial for you to take aspirin daily. Ask your health care provider if you need to take a cholesterol-lowering medicine (statin). Find healthy ways to manage stress, such as: Meditation, yoga, or listening to music. Journaling. Talking to a trusted person. Spending time with friends and family. Minimize exposure to UV radiation to reduce your risk of skin cancer. Safety Always wear your seat belt while driving or riding in a vehicle. Do not drive: If you have been drinking alcohol. Do not ride with someone who has been drinking. When you are tired or distracted. While texting. If you have been using any mind-altering substances or drugs. Wear a helmet and other protective equipment during  sports activities. If you have firearms in your house, make sure you follow all gun safety procedures. What's next? Visit your health care provider once a year for an annual wellness visit. Ask your health care provider how often you should have your eyes and teeth checked. Stay up to date on all vaccines. This information is not intended to replace advice given to you by your health care provider. Make sure you discuss any questions you have with your health care provider. Document Revised: 01/09/2021 Document Reviewed: 01/09/2021 Elsevier Patient Education  [place].

## 2021-06-18 NOTE — Addendum Note (Signed)
Addended by: Zacarias Pontes on: 06/18/2021 03:19 PM   Modules accepted: Orders

## 2021-06-18 NOTE — Assessment & Plan Note (Signed)
Database with no red flags.  We will refill Vyvanse today.  Medications help with ability to stay focused and on task.  Follow-up in 6 months.

## 2021-06-18 NOTE — Progress Notes (Signed)
Chief Complaint:  Kara Kennedy is a 65 y.o. female who presents today for her annual comprehensive physical exam.    Assessment/Plan:  Chronic Problems Addressed Today: Attention deficit hyperactivity disorder (ADHD), combined type Database with no red flags.  We will refill Vyvanse today.  Medications help with ability to stay focused and on task.  Follow-up in 6 months.  Vitamin D deficiency Check vitamin D.   Post menopausal syndrome Doing well on estrogen 48m daily.   Insomnia Stable on trazodone 25 to 50 mg daily as needed.  Hypothyroidism On Synthroid 88 mcg daily.  Check TSH.  Anxiety Worsened recently.  Has been under quite a bit of stress at home and at work.  She lives in the process of building a new house.  Symptoms are currently manageable however she would like to be referred to a counselor.  We will place referral today.  She deferred medication changes.  Allergic rhinitis Stable on claritin.   Preventative Healthcare: Will get blood work done today. Pneumonia and shingles vaccine given today. Due for mammogram.   Patient Counseling(The following topics were reviewed and/or handout was given):  -Nutrition: Stressed importance of moderation in sodium/caffeine intake, saturated fat and cholesterol, caloric balance, sufficient intake of fresh fruits, vegetables, and fiber.  -Stressed the importance of regular exercise.   -Substance Abuse: Discussed cessation/primary prevention of tobacco, alcohol, or other drug use; driving or other dangerous activities under the influence; availability of treatment for abuse.   -Injury prevention: Discussed safety belts, safety helmets, smoke detector, smoking near bedding or upholstery.   -Sexuality: Discussed sexually transmitted diseases, partner selection, use of condoms, avoidance of unintended pregnancy and contraceptive alternatives.   -Dental health: Discussed importance of regular tooth brushing, flossing, and dental  visits.  -Health maintenance and immunizations reviewed. Please refer to Health maintenance section.  Return to care in 1 year for next preventative visit.     Subjective:  HPI:  She has no acute complaints today.  See A/p for status of chronic conditions.   Lifestyle Diet: None specific  Exercise: Likes to walk.  [facility]  Decreased Interest 0  Down, Depressed, Hopeless 0  PHQ - 2 Score 0  Altered sleeping -  Tired, decreased energy -  Change in appetite -  Feeling bad or failure about yourself  -  Trouble concentrating -  Moving slowly or fidgety/restless -  Suicidal thoughts -  PHQ-9 Score -  Difficult doing work/chores -    Health Maintenance Due  Topic Date Due   Zoster Vaccines- Shingrix (1 of 2) Never done   COVID-19 Vaccine (4 - Booster for Moderna series) 08/24/2020   MAMMOGRAM  08/28/2020   Pneumonia Vaccine 65 Years old (3 - PCV) 05/14/2021     ROS: Per HPI, otherwise a complete review of systems was negative.   PMH:  The following were reviewed and entered/updated in epic: Past Medical History:  Diagnosis Date   Allergic rhinitis 07/30/2012   Allergy    Anxiety 03/06/2014   Chronic neck pain 09/06/2012   Colon polyps    Endometriosis    Family history of colon cancer 12/29/2016   Family history of polyps in the colon    History of pyelonephritis 07/30/2012   Hypothyroidism 03/06/2014   IBS (irritable bowel syndrome) 12/29/2016   Insomnia 08/19/2011   Pneumonia    hx of 2009    PONV (postoperative nausea and vomiting)    Post menopausal syndrome 03/06/2014  Patient Active Problem List   Diagnosis Date Noted   Acromioclavicular joint arthritis 02/11/2019   Arthritis of right shoulder region 11/29/2018   Attention deficit hyperactivity disorder (ADHD), combined type 11/02/2018   Cervical spondylosis 10/26/2017   Vitamin D deficiency 03/09/2017   IBS (irritable bowel syndrome) 12/29/2016   Family history of colon cancer  12/29/2016   Anxiety 03/06/2014   Hypothyroidism 03/06/2014   Post menopausal syndrome 03/06/2014   Allergic rhinitis 07/30/2012   Insomnia 08/19/2011   Past Surgical History:  Procedure Laterality Date   ABDOMINAL HYSTERECTOMY     cervical fusion C5-C7      CESAREAN SECTION     DILATION AND CURETTAGE OF UTERUS     GALLBLADDER SURGERY     KNEE ARTHROSCOPY WITH MEDIAL MENISECTOMY Left 04/30/2018   Procedure: LEFT KNEE ARTHROSCOPY WITH DEBRIDEMENT;  Surgeon: Mcarthur Rossetti, MD;  Location: [facility];  Service: Orthopedics;  Laterality: Left;   SPINE SURGERY      Family History  Problem Relation Age of Onset   Colon cancer Mother 28       d. 23   Diabetes Father    Heart disease Father    Brain cancer Maternal Grandmother 48       d. 75   Diabetes Paternal Grandfather    Colon polyps Sister    Other Brother        RAD51D pos   Colon polyps Brother    Colon polyps Brother    Liver cancer Brother    Esophageal cancer Neg Hx    Rectal cancer Neg Hx    Stomach cancer Neg Hx     Medications- reviewed and updated Current Outpatient Medications  Medication Sig Dispense Refill   cholecalciferol (VITAMIN D) 1000 units tablet Take 1,000 Units by mouth daily.     estradiol (ESTRACE) 1 MG tablet Take 1 tablet (1 mg total) by mouth daily. 90 tablet 3   gabapentin (NEURONTIN) 300 MG capsule TAKE 3 CAPSULES BY MOUTH AT BEDTIME. 270 capsule 2   linaclotide (LINZESS) 290 MCG CAPS capsule TAKE 1 CAPSULE BY MOUTH ONCE DAILY BEFORE BREAKFAST 90 capsule 3   loratadine (CLARITIN) 10 MG tablet Take 10 mg by mouth daily.     meloxicam (MOBIC) 15 MG tablet TAKE 1 TABLET (15 MG TOTAL) BY MOUTH DAILY. 90 tablet 1   montelukast (SINGULAIR) 10 MG tablet TAKE 1 TABLET BY MOUTH AT BEDTIME. 90 tablet 3   SYNTHROID 88 MCG tablet TAKE 1 TABLET BY MOUTH EVERY MORNING. 90 tablet 3   traZODone (DESYREL) 50 MG tablet TAKE 1/2-1 TABLET BY MOUTH AT BEDTIME AS NEEDED FOR SLEEP. 90 tablet 2   ALPRAZolam  (XANAX) 0.5 MG tablet Take by mouth. (Patient not taking: Reported on 06/18/2021)     lisdexamfetamine (VYVANSE) 50 MG capsule TAKE 1 CAPSULE (50 MG TOTAL) BY MOUTH DAILY. 90 capsule 0   No current facility-administered medications for this visit.    Allergies-reviewed and updated Allergies  Allergen Reactions   Depo-Medrol [Methylprednisolone Sodium Succ] Swelling    After knee injection   Sulfamethoxazole-Trimethoprim     Other reaction(s): Unknown    Social History   Socioeconomic History   Marital status: Married    Spouse name: Not on file   Number of children: Not on file   Years of education: Not on file   Highest education level: Not on file  Occupational History   Not on file  Tobacco Use   Smoking status: Former  Smokeless tobacco: Never   Tobacco comments:    Quit 1999  Vaping Use   Vaping Use: Never used  Substance and Sexual Activity   Alcohol use: Yes    Alcohol/week: 7.0 standard drinks    Types: 7 Glasses of wine per week    Comment: rare   Drug use: No   Sexual activity: Yes    Partners: Male    Birth control/protection: Surgical  Other Topics Concern   Not on file  Social History Narrative   Not on file   Social Determinants of Health   Financial Resource Strain: Not on file  Food Insecurity: Not on file  Transportation Needs: Not on file  Physical Activity: Not on file  Stress: Not on file  Social Connections: Not on file        Objective:  Physical Exam: BP 112/75   Pulse 74   Temp 98.4 F (36.9 C) (Temporal)   Ht 5' 3" (1.6 m)   Wt 138 lb 6 oz (62.8 kg)   LMP  (LMP Unknown)   SpO2 100%   BMI 24.51 kg/m   Body mass index is 24.51 kg/m. Wt Readings from Last 3 Encounters:  06/18/21 138 lb 6 oz (62.8 kg)  03/22/21 139 lb 12.8 oz (63.4 kg)  03/18/21 140 lb (63.5 kg)   Gen: NAD, resting comfortably HEENT: TMs normal bilaterally. OP clear. No thyromegaly noted.  CV: RRR with no murmurs appreciated Pulm: NWOB, CTAB with no  crackles, wheezes, or rhonchi GI: Normal bowel sounds present. Soft, Nontender, Nondistended. MSK: no edema, cyanosis, or clubbing noted Skin: warm, dry Neuro: CN2-12 grossly intact. Strength 5/5 in upper and lower extremities. Reflexes symmetric and intact bilaterally.  Psych: Normal affect and thought content      I,Savera Zaman,acting as a scribe for Dimas Chyle, MD.,have documented all relevant documentation on the behalf of Dimas Chyle, MD,as directed by  Dimas Chyle, MD while in the presence of Dimas Chyle, MD.   I, Dimas Chyle, MD, have reviewed all documentation for this visit. The documentation on 06/18/21 for the exam, diagnosis, procedures, and orders are all accurate and complete.  Algis Greenhouse. Jerline Pain, MD 06/18/2021 3:08 PM

## 2021-06-18 NOTE — Assessment & Plan Note (Signed)
Stable on trazodone 25 to 50 mg daily as needed.

## 2021-06-18 NOTE — Assessment & Plan Note (Signed)
Stable on claritin.

## 2021-06-18 NOTE — Assessment & Plan Note (Signed)
On Synthroid 88 mcg daily.  Check TSH. 

## 2021-06-18 NOTE — Assessment & Plan Note (Signed)
Doing well on estrogen 1mg  daily.

## 2021-06-19 LAB — CBC
HCT: 40.3 % (ref 36.0–46.0)
Hemoglobin: 13.1 g/dL (ref 12.0–15.0)
MCHC: 32.6 g/dL (ref 30.0–36.0)
MCV: 88.7 fl (ref 78.0–100.0)
Platelets: 335 10*3/uL (ref 150.0–400.0)
RBC: 4.54 Mil/uL (ref 3.87–5.11)
RDW: 13 % (ref 11.5–15.5)
WBC: 7 10*3/uL (ref 4.0–10.5)

## 2021-06-19 LAB — LIPID PANEL
Cholesterol: 194 mg/dL (ref 0–200)
HDL: 78.2 mg/dL (ref >39.00)
LDL Cholesterol: 91 mg/dL (ref 0–99)
NonHDL: 115.94
Total CHOL/HDL Ratio: 2
Triglycerides: 126 mg/dL (ref 0.0–149.0)
VLDL: 25.2 mg/dL (ref 0.0–40.0)

## 2021-06-19 LAB — COMPREHENSIVE METABOLIC PANEL
ALT: 27 U/L (ref 0–35)
AST: 22 U/L (ref 0–37)
Albumin: 4.2 g/dL (ref 3.5–5.2)
Alkaline Phosphatase: 114 U/L (ref 39–117)
BUN: 15 mg/dL (ref 6–23)
CO2: 30 mEq/L (ref 19–32)
Calcium: 9.1 mg/dL (ref 8.4–10.5)
Chloride: 104 mEq/L (ref 96–112)
Creatinine, Ser: 0.7 mg/dL (ref 0.40–1.20)
GFR: 90.26 mL/min (ref >60.00)
Glucose, Bld: 62 mg/dL — ABNORMAL LOW (ref 70–99)
Potassium: 4.5 mEq/L (ref 3.5–5.1)
Sodium: 140 mEq/L (ref 135–145)
Total Bilirubin: 0.3 mg/dL (ref 0.2–1.2)
Total Protein: 6.4 g/dL (ref 6.0–8.3)

## 2021-06-19 LAB — HEMOGLOBIN A1C: Hgb A1c MFr Bld: 5.8 % (ref 4.6–6.5)

## 2021-06-19 LAB — VITAMIN D 25 HYDROXY (VIT D DEFICIENCY, FRACTURES): VITD: 54.05 ng/mL (ref 30.00–100.00)

## 2021-06-19 LAB — TSH: TSH: 0.25 u[IU]/mL — ABNORMAL LOW (ref 0.35–5.50)

## 2021-06-24 NOTE — Progress Notes (Signed)
Please inform patient of the following:  Her A1c is borderline. Do not need to start medications but she should work on diet and exercise and we can recheck in a year.  Her TSH is a little low. Would like for her to come back in 1-2 weeks to recheck TSH before we make any medication adjustments.   The rest of her labs are normal and we can recheck in a year.  Kara Kennedy. Jerline Pain, MD 06/24/2021 7:57 AM

## 2021-06-25 ENCOUNTER — Other Ambulatory Visit: Payer: Self-pay | Admitting: *Deleted

## 2021-06-25 DIAGNOSIS — E032 Hypothyroidism due to medicaments and other exogenous substances: Secondary | ICD-10-CM

## 2021-07-09 ENCOUNTER — Other Ambulatory Visit: Payer: Self-pay

## 2021-07-09 ENCOUNTER — Other Ambulatory Visit (INDEPENDENT_AMBULATORY_CARE_PROVIDER_SITE_OTHER): Payer: 59

## 2021-07-09 DIAGNOSIS — E032 Hypothyroidism due to medicaments and other exogenous substances: Secondary | ICD-10-CM

## 2021-07-09 LAB — TSH: TSH: 0.24 u[IU]/mL — ABNORMAL LOW (ref 0.35–5.50)

## 2021-07-10 NOTE — Progress Notes (Signed)
Please inform patient of the following:  Her TSH is still too low. We should decrease her dose of synthroid to 35mcg. Please send in new rx. We should recheck in 4-6 months.  Algis Greenhouse. Jerline Pain, MD 07/10/2021 8:51 AM

## 2021-07-15 ENCOUNTER — Other Ambulatory Visit: Payer: Self-pay | Admitting: *Deleted

## 2021-07-15 DIAGNOSIS — E032 Hypothyroidism due to medicaments and other exogenous substances: Secondary | ICD-10-CM

## 2021-07-16 ENCOUNTER — Other Ambulatory Visit (INDEPENDENT_AMBULATORY_CARE_PROVIDER_SITE_OTHER): Payer: 59

## 2021-07-16 ENCOUNTER — Other Ambulatory Visit: Payer: Self-pay

## 2021-07-16 DIAGNOSIS — E032 Hypothyroidism due to medicaments and other exogenous substances: Secondary | ICD-10-CM | POA: Diagnosis not present

## 2021-07-16 LAB — T4, FREE: Free T4: 1.12 ng/dL (ref 0.60–1.60)

## 2021-07-17 ENCOUNTER — Telehealth: Payer: Self-pay

## 2021-07-17 LAB — T3: T3, Total: 132 ng/dL (ref 76–181)

## 2021-07-17 NOTE — Progress Notes (Signed)
Please inform patient of the following:  Thyroid levels are stable. I am find with leaving her dose of synthroid where it is now. We can recheck in 3-6 months.  Algis Greenhouse. Jerline Pain, MD 07/17/2021 10:36 AM

## 2021-07-17 NOTE — Telephone Encounter (Signed)
Patient has called back.  I have advised patient of Dr. Marigene Ehlers response.    Patient understood.

## 2021-07-17 NOTE — Telephone Encounter (Signed)
Noted  

## 2021-07-31 ENCOUNTER — Other Ambulatory Visit (HOSPITAL_COMMUNITY): Payer: Self-pay

## 2021-08-05 ENCOUNTER — Other Ambulatory Visit (HOSPITAL_COMMUNITY): Payer: Self-pay

## 2021-08-13 ENCOUNTER — Other Ambulatory Visit (HOSPITAL_COMMUNITY): Payer: Self-pay

## 2021-08-13 MED ORDER — CARESTART COVID-19 HOME TEST VI KIT
PACK | 0 refills | Status: DC
  Filled 2021-08-13: qty 4, 4d supply, fill #0

## 2021-08-16 ENCOUNTER — Other Ambulatory Visit: Payer: Self-pay | Admitting: Family Medicine

## 2021-08-16 ENCOUNTER — Encounter: Payer: Self-pay | Admitting: Family Medicine

## 2021-08-16 ENCOUNTER — Other Ambulatory Visit (HOSPITAL_COMMUNITY): Payer: Self-pay

## 2021-08-16 DIAGNOSIS — E039 Hypothyroidism, unspecified: Secondary | ICD-10-CM

## 2021-08-16 DIAGNOSIS — J302 Other seasonal allergic rhinitis: Secondary | ICD-10-CM

## 2021-08-16 DIAGNOSIS — K5909 Other constipation: Secondary | ICD-10-CM

## 2021-08-16 MED ORDER — SYNTHROID 88 MCG PO TABS
88.0000 ug | ORAL_TABLET | Freq: Every morning | ORAL | 3 refills | Status: DC
  Filled 2021-08-16: qty 90, 90d supply, fill #0
  Filled 2021-11-13: qty 90, 90d supply, fill #1
  Filled 2022-02-12: qty 90, 90d supply, fill #2
  Filled 2022-05-09: qty 90, 90d supply, fill #3

## 2021-08-16 MED ORDER — LINACLOTIDE 290 MCG PO CAPS
ORAL_CAPSULE | ORAL | 3 refills | Status: DC
  Filled 2021-08-16: qty 90, 90d supply, fill #0
  Filled 2021-11-13: qty 90, 90d supply, fill #1
  Filled 2022-02-12: qty 90, 90d supply, fill #2
  Filled 2022-05-09: qty 90, 90d supply, fill #3

## 2021-08-16 MED ORDER — MELOXICAM 15 MG PO TABS
ORAL_TABLET | Freq: Every day | ORAL | 1 refills | Status: DC
  Filled 2021-08-16: qty 90, 90d supply, fill #0
  Filled 2021-12-02: qty 90, 90d supply, fill #1

## 2021-08-16 MED ORDER — MONTELUKAST SODIUM 10 MG PO TABS
ORAL_TABLET | Freq: Every day | ORAL | 3 refills | Status: DC
  Filled 2021-08-16: qty 90, 90d supply, fill #0
  Filled 2021-12-02: qty 90, 90d supply, fill #1
  Filled 2022-03-05: qty 90, 90d supply, fill #2
  Filled 2022-05-29: qty 8, 8d supply, fill #3
  Filled 2022-05-30: qty 82, 82d supply, fill #3

## 2021-08-29 ENCOUNTER — Other Ambulatory Visit (HOSPITAL_COMMUNITY): Payer: Self-pay

## 2021-09-12 ENCOUNTER — Other Ambulatory Visit (HOSPITAL_COMMUNITY): Payer: Self-pay

## 2021-09-12 ENCOUNTER — Other Ambulatory Visit: Payer: Self-pay | Admitting: Family Medicine

## 2021-09-12 MED ORDER — LISDEXAMFETAMINE DIMESYLATE 50 MG PO CAPS
ORAL_CAPSULE | Freq: Every day | ORAL | 0 refills | Status: DC
  Filled 2021-09-12: qty 90, fill #0
  Filled 2021-09-13: qty 90, 90d supply, fill #0

## 2021-09-12 NOTE — Telephone Encounter (Signed)
Last visit: 06/18/21  Next visit: 12/17/21  Last refill: 06/18/21  Quantity:  90

## 2021-09-13 ENCOUNTER — Other Ambulatory Visit (HOSPITAL_COMMUNITY): Payer: Self-pay

## 2021-10-31 ENCOUNTER — Other Ambulatory Visit (HOSPITAL_COMMUNITY): Payer: Self-pay

## 2021-10-31 ENCOUNTER — Encounter: Payer: Self-pay | Admitting: Family Medicine

## 2021-10-31 NOTE — Telephone Encounter (Signed)
See note

## 2021-11-04 ENCOUNTER — Other Ambulatory Visit (HOSPITAL_COMMUNITY): Payer: Self-pay

## 2021-11-04 ENCOUNTER — Other Ambulatory Visit: Payer: Self-pay

## 2021-11-04 DIAGNOSIS — M546 Pain in thoracic spine: Secondary | ICD-10-CM

## 2021-11-04 MED ORDER — GABAPENTIN 600 MG PO TABS: 1200.0000 mg | ORAL_TABLET | Freq: Every day | ORAL | Status: DC

## 2021-11-04 NOTE — Telephone Encounter (Signed)
Ok to send in new rx for '1200mg'$  nightly. ? ?Algis Greenhouse. Jerline Pain, MD ?11/04/2021 8:00 AM  ? ?

## 2021-11-04 NOTE — Telephone Encounter (Signed)
Rx sent to pharmacy for patient.

## 2021-11-13 ENCOUNTER — Other Ambulatory Visit (HOSPITAL_COMMUNITY): Payer: Self-pay

## 2021-11-21 DIAGNOSIS — M4802 Spinal stenosis, cervical region: Secondary | ICD-10-CM | POA: Diagnosis not present

## 2021-11-21 DIAGNOSIS — M5416 Radiculopathy, lumbar region: Secondary | ICD-10-CM | POA: Diagnosis not present

## 2021-11-21 DIAGNOSIS — G5603 Carpal tunnel syndrome, bilateral upper limbs: Secondary | ICD-10-CM | POA: Diagnosis not present

## 2021-12-02 ENCOUNTER — Other Ambulatory Visit (HOSPITAL_COMMUNITY): Payer: Self-pay

## 2021-12-17 ENCOUNTER — Ambulatory Visit: Payer: 59 | Admitting: Family Medicine

## 2021-12-27 ENCOUNTER — Other Ambulatory Visit (HOSPITAL_COMMUNITY): Payer: Self-pay

## 2021-12-27 ENCOUNTER — Encounter: Payer: Self-pay | Admitting: Family Medicine

## 2021-12-27 ENCOUNTER — Ambulatory Visit: Payer: Managed Care, Other (non HMO) | Admitting: Family Medicine

## 2021-12-27 VITALS — BP 112/70 | HR 85 | Temp 97.1°F | Ht 63.0 in | Wt 140.8 lb

## 2021-12-27 DIAGNOSIS — M546 Pain in thoracic spine: Secondary | ICD-10-CM

## 2021-12-27 DIAGNOSIS — F419 Anxiety disorder, unspecified: Secondary | ICD-10-CM

## 2021-12-27 DIAGNOSIS — F902 Attention-deficit hyperactivity disorder, combined type: Secondary | ICD-10-CM | POA: Diagnosis not present

## 2021-12-27 DIAGNOSIS — G8929 Other chronic pain: Secondary | ICD-10-CM

## 2021-12-27 DIAGNOSIS — F5102 Adjustment insomnia: Secondary | ICD-10-CM | POA: Diagnosis not present

## 2021-12-27 DIAGNOSIS — M47812 Spondylosis without myelopathy or radiculopathy, cervical region: Secondary | ICD-10-CM

## 2021-12-27 MED ORDER — GABAPENTIN 300 MG PO CAPS
1200.0000 mg | ORAL_CAPSULE | Freq: Every day | ORAL | 3 refills | Status: DC
  Filled 2021-12-27: qty 360, 90d supply, fill #0
  Filled 2022-02-07 – 2022-03-20 (×2): qty 360, 90d supply, fill #1
  Filled 2022-06-24: qty 360, 90d supply, fill #2
  Filled 2022-09-17: qty 360, 90d supply, fill #3

## 2021-12-27 MED ORDER — LISDEXAMFETAMINE DIMESYLATE 60 MG PO CAPS
60.0000 mg | ORAL_CAPSULE | ORAL | 0 refills | Status: DC
  Filled 2021-12-27: qty 90, 90d supply, fill #0

## 2021-12-27 NOTE — Progress Notes (Signed)
   Kara Kennedy is a 66 y.o. female who presents today for an office visit.  Assessment/Plan:  Chronic Problems Addressed Today: Attention deficit hyperactivity disorder (ADHD), combined type Database without red flags.  She feels like the Vyvanse has not been as effective recently.  We will increase to 60 mg daily.  She will follow-up with me in a couple weeks via MyChart.  Follow-up in 6 months for annual physical and we can make medication check at that point.  Medications typically help with ability stay focused and on task.  No significant side effects.  Cervical spondylosis She is following with neurosurgery for this.  She occasionally takes extra dose of gabapentin at night which seems to help.  She has upcoming appoint with neurosurgery soon and may be getting MRI soon.  We will send in refill for gabapentin 1200 mg nightly.  She topically tolerates this well without any significant side effects.  Anxiety She has been under more stress recently due to starting a new job and building a new house.  She is also having some caregiver burden due to the declining health of her mother.  Symptoms are currently manageable.     Subjective:  HPI:  See A/p for status of chronic conditions.         Objective:  Physical Exam: BP 112/70 (BP Location: Left Arm, Patient Position: Sitting, Cuff Size: Normal)   Pulse 85   Temp (!) 97.1 F (36.2 C) (Temporal)   Ht '5\' 3"'$  (1.6 m)   Wt 140 lb 12.8 oz (63.9 kg)   LMP  (LMP Unknown)   SpO2 98%   BMI 24.94 kg/m   Gen: No acute distress, resting comfortably CV: Regular rate and rhythm with no murmurs appreciated Pulm: Normal work of breathing, clear to auscultation bilaterally with no crackles, wheezes, or rhonchi Neuro: Grossly normal, moves all extremities Psych: Normal affect and thought content      Kara Hlad M. Jerline Pain, MD 12/27/2021 11:08 AM

## 2021-12-27 NOTE — Assessment & Plan Note (Signed)
She is following with neurosurgery for this.  She occasionally takes extra dose of gabapentin at night which seems to help.  She has upcoming appoint with neurosurgery soon and may be getting MRI soon.  We will send in refill for gabapentin 1200 mg nightly.  She topically tolerates this well without any significant side effects.

## 2021-12-27 NOTE — Assessment & Plan Note (Signed)
She has been under more stress recently due to starting a new job and building a new house.  She is also having some caregiver burden due to the declining health of her mother.  Symptoms are currently manageable.

## 2021-12-27 NOTE — Patient Instructions (Addendum)
It was very nice to see you today!  We will increase your vvyanse to '60mg'$  daily.  I will send in more gabapentin.   We will see you back in 6 months for your annual physical. Please come back sooner if needed.   Take care, Dr Jerline Pain  PLEASE NOTE:  If you had any lab tests please let us know if you have not heard back within a few days. You may see your results on mychart before we have a chance to review them but we will give you a call once they are reviewed by [place]. If we ordered any referrals today, please let us know if you have not heard from their office within the next week.   Please try these tips to maintain a healthy lifestyle:  Eat at least 3 REAL meals and 1-2 snacks per day.  Aim for no more than 5 hours between eating.  If you eat breakfast, please do so within one hour of getting up.   Each meal should contain half fruits/vegetables, one quarter protein, and one quarter carbs (no bigger than a computer mouse)  Cut down on sweet beverages. This includes juice, soda, and sweet tea.   Drink at least 1 glass of water with each meal and aim for at least 8 glasses per day  Exercise at least 150 minutes every week.

## 2021-12-27 NOTE — Assessment & Plan Note (Signed)
Database without red flags.  She feels like the Vyvanse has not been as effective recently.  We will increase to 60 mg daily.  She will follow-up with me in a couple weeks via MyChart.  Follow-up in 6 months for annual physical and we can make medication check at that point.  Medications typically help with ability stay focused and on task.  No significant side effects.

## 2022-01-01 ENCOUNTER — Ambulatory Visit: Payer: 59 | Admitting: Family Medicine

## 2022-01-29 ENCOUNTER — Other Ambulatory Visit (HOSPITAL_COMMUNITY): Payer: Self-pay

## 2022-01-31 ENCOUNTER — Other Ambulatory Visit (HOSPITAL_COMMUNITY): Payer: Self-pay

## 2022-02-07 ENCOUNTER — Other Ambulatory Visit (HOSPITAL_COMMUNITY): Payer: Self-pay

## 2022-02-13 ENCOUNTER — Other Ambulatory Visit (HOSPITAL_COMMUNITY): Payer: Self-pay

## 2022-03-05 ENCOUNTER — Other Ambulatory Visit: Payer: Self-pay | Admitting: Family Medicine

## 2022-03-05 ENCOUNTER — Encounter (INDEPENDENT_AMBULATORY_CARE_PROVIDER_SITE_OTHER): Payer: Self-pay

## 2022-03-05 ENCOUNTER — Other Ambulatory Visit (HOSPITAL_COMMUNITY): Payer: Self-pay

## 2022-03-05 MED ORDER — MELOXICAM 15 MG PO TABS
ORAL_TABLET | Freq: Every day | ORAL | 1 refills | Status: DC
  Filled 2022-03-05: qty 90, 90d supply, fill #0
  Filled 2022-04-27 – 2022-05-29 (×2): qty 90, 90d supply, fill #1

## 2022-03-20 ENCOUNTER — Other Ambulatory Visit: Payer: Self-pay | Admitting: Family Medicine

## 2022-03-21 ENCOUNTER — Other Ambulatory Visit (HOSPITAL_COMMUNITY): Payer: Self-pay

## 2022-03-21 MED ORDER — LISDEXAMFETAMINE DIMESYLATE 60 MG PO CAPS
60.0000 mg | ORAL_CAPSULE | ORAL | 0 refills | Status: DC
  Filled 2022-03-21 – 2022-03-27 (×2): qty 90, 90d supply, fill #0

## 2022-03-24 ENCOUNTER — Other Ambulatory Visit (HOSPITAL_COMMUNITY): Payer: Self-pay

## 2022-03-27 ENCOUNTER — Other Ambulatory Visit (HOSPITAL_COMMUNITY): Payer: Self-pay

## 2022-04-01 ENCOUNTER — Other Ambulatory Visit (HOSPITAL_COMMUNITY): Payer: Self-pay

## 2022-04-21 ENCOUNTER — Encounter: Payer: Self-pay | Admitting: *Deleted

## 2022-04-27 ENCOUNTER — Other Ambulatory Visit: Payer: Self-pay | Admitting: Family Medicine

## 2022-04-27 DIAGNOSIS — F5102 Adjustment insomnia: Secondary | ICD-10-CM

## 2022-04-28 ENCOUNTER — Other Ambulatory Visit (HOSPITAL_COMMUNITY): Payer: Self-pay

## 2022-04-28 MED ORDER — ESTRADIOL 1 MG PO TABS
1.0000 mg | ORAL_TABLET | Freq: Every day | ORAL | 3 refills | Status: DC
  Filled 2022-04-28 – 2022-05-08 (×2): qty 90, 90d supply, fill #0
  Filled 2022-07-22: qty 90, 90d supply, fill #1
  Filled 2022-10-12 – 2022-12-07 (×5): qty 90, 90d supply, fill #2

## 2022-04-28 MED ORDER — TRAZODONE HCL 50 MG PO TABS
ORAL_TABLET | ORAL | 2 refills | Status: DC
  Filled 2022-04-28 – 2022-05-08 (×2): qty 90, 90d supply, fill #0
  Filled 2022-07-22: qty 90, 90d supply, fill #1
  Filled 2022-10-12: qty 90, 90d supply, fill #2

## 2022-04-29 ENCOUNTER — Other Ambulatory Visit (HOSPITAL_COMMUNITY): Payer: Self-pay

## 2022-04-29 MED ORDER — LISDEXAMFETAMINE DIMESYLATE 60 MG PO CAPS
60.0000 mg | ORAL_CAPSULE | ORAL | 0 refills | Status: DC
  Filled 2022-04-29 – 2022-06-24 (×3): qty 90, 90d supply, fill #0

## 2022-05-01 ENCOUNTER — Other Ambulatory Visit (HOSPITAL_COMMUNITY): Payer: Self-pay

## 2022-05-05 ENCOUNTER — Other Ambulatory Visit (HOSPITAL_COMMUNITY): Payer: Self-pay

## 2022-05-07 ENCOUNTER — Other Ambulatory Visit (HOSPITAL_COMMUNITY): Payer: Self-pay

## 2022-05-08 ENCOUNTER — Other Ambulatory Visit (HOSPITAL_COMMUNITY): Payer: Self-pay

## 2022-05-09 ENCOUNTER — Other Ambulatory Visit (HOSPITAL_COMMUNITY): Payer: Self-pay

## 2022-05-10 ENCOUNTER — Other Ambulatory Visit (HOSPITAL_COMMUNITY): Payer: Self-pay

## 2022-05-15 ENCOUNTER — Other Ambulatory Visit (HOSPITAL_COMMUNITY): Payer: Self-pay

## 2022-05-19 ENCOUNTER — Other Ambulatory Visit (HOSPITAL_COMMUNITY): Payer: Self-pay

## 2022-05-21 ENCOUNTER — Other Ambulatory Visit (HOSPITAL_COMMUNITY): Payer: Self-pay

## 2022-05-30 ENCOUNTER — Other Ambulatory Visit (HOSPITAL_COMMUNITY): Payer: Self-pay

## 2022-06-02 ENCOUNTER — Other Ambulatory Visit (HOSPITAL_COMMUNITY): Payer: Self-pay

## 2022-06-02 ENCOUNTER — Encounter (HOSPITAL_COMMUNITY): Payer: Self-pay

## 2022-06-24 ENCOUNTER — Other Ambulatory Visit (HOSPITAL_COMMUNITY): Payer: Self-pay

## 2022-06-26 ENCOUNTER — Encounter: Payer: Managed Care, Other (non HMO) | Admitting: Family Medicine

## 2022-06-26 ENCOUNTER — Other Ambulatory Visit (HOSPITAL_COMMUNITY): Payer: Self-pay

## 2022-06-27 ENCOUNTER — Other Ambulatory Visit (HOSPITAL_COMMUNITY): Payer: Self-pay

## 2022-07-10 ENCOUNTER — Encounter: Payer: Self-pay | Admitting: *Deleted

## 2022-07-14 ENCOUNTER — Encounter: Payer: Managed Care, Other (non HMO) | Admitting: Family Medicine

## 2022-07-22 ENCOUNTER — Other Ambulatory Visit (HOSPITAL_COMMUNITY): Payer: Self-pay

## 2022-07-22 ENCOUNTER — Other Ambulatory Visit: Payer: Self-pay

## 2022-07-22 ENCOUNTER — Other Ambulatory Visit (HOSPITAL_COMMUNITY): Payer: Self-pay | Admitting: Pharmacist

## 2022-07-22 ENCOUNTER — Other Ambulatory Visit: Payer: Self-pay | Admitting: Family Medicine

## 2022-07-22 DIAGNOSIS — K5909 Other constipation: Secondary | ICD-10-CM

## 2022-07-22 DIAGNOSIS — E039 Hypothyroidism, unspecified: Secondary | ICD-10-CM

## 2022-07-22 MED ORDER — LINACLOTIDE 290 MCG PO CAPS
290.0000 ug | ORAL_CAPSULE | Freq: Every day | ORAL | 3 refills | Status: DC
  Filled 2022-07-22 – 2022-08-26 (×2): qty 90, 90d supply, fill #0

## 2022-07-22 MED ORDER — MELOXICAM 15 MG PO TABS
15.0000 mg | ORAL_TABLET | Freq: Every day | ORAL | 1 refills | Status: AC
  Filled 2022-07-22: qty 90, fill #0
  Filled 2022-08-26: qty 90, 90d supply, fill #0
  Filled 2022-12-07: qty 90, 90d supply, fill #1

## 2022-07-22 MED ORDER — SYNTHROID 88 MCG PO TABS
88.0000 ug | ORAL_TABLET | Freq: Every morning | ORAL | 3 refills | Status: DC
  Filled 2022-07-22: qty 90, 90d supply, fill #0
  Filled 2022-11-19: qty 90, 90d supply, fill #1

## 2022-08-06 DIAGNOSIS — M5416 Radiculopathy, lumbar region: Secondary | ICD-10-CM | POA: Diagnosis not present

## 2022-08-11 ENCOUNTER — Encounter: Payer: Managed Care, Other (non HMO) | Admitting: Family Medicine

## 2022-08-26 ENCOUNTER — Other Ambulatory Visit: Payer: Self-pay | Admitting: Family Medicine

## 2022-08-26 ENCOUNTER — Other Ambulatory Visit: Payer: Self-pay

## 2022-08-26 ENCOUNTER — Encounter (HOSPITAL_COMMUNITY): Payer: Self-pay

## 2022-08-26 ENCOUNTER — Other Ambulatory Visit (HOSPITAL_COMMUNITY): Payer: Self-pay

## 2022-08-26 DIAGNOSIS — J302 Other seasonal allergic rhinitis: Secondary | ICD-10-CM

## 2022-08-26 MED ORDER — MONTELUKAST SODIUM 10 MG PO TABS
10.0000 mg | ORAL_TABLET | Freq: Every day | ORAL | 3 refills | Status: AC
  Filled 2022-08-26: qty 90, 90d supply, fill #0
  Filled 2022-12-07: qty 90, 90d supply, fill #1

## 2022-08-26 MED ORDER — LISDEXAMFETAMINE DIMESYLATE 60 MG PO CAPS
60.0000 mg | ORAL_CAPSULE | ORAL | 0 refills | Status: DC
  Filled 2022-08-26 – 2022-09-22 (×3): qty 90, 90d supply, fill #0
  Filled 2022-09-24 (×2): qty 30, 30d supply, fill #0
  Filled 2022-09-24: qty 90, 90d supply, fill #0

## 2022-08-26 NOTE — Telephone Encounter (Signed)
Please have her schedule OV for further refills.

## 2022-08-26 NOTE — Telephone Encounter (Signed)
Pt requesting refills. Last OV 12/2021

## 2022-08-27 ENCOUNTER — Other Ambulatory Visit: Payer: Self-pay

## 2022-08-27 ENCOUNTER — Other Ambulatory Visit (HOSPITAL_COMMUNITY): Payer: Self-pay

## 2022-09-16 ENCOUNTER — Other Ambulatory Visit (HOSPITAL_COMMUNITY): Payer: Self-pay

## 2022-09-17 ENCOUNTER — Other Ambulatory Visit: Payer: Self-pay

## 2022-09-17 ENCOUNTER — Other Ambulatory Visit (HOSPITAL_COMMUNITY): Payer: Self-pay

## 2022-09-22 ENCOUNTER — Other Ambulatory Visit: Payer: Self-pay

## 2022-09-24 ENCOUNTER — Other Ambulatory Visit (HOSPITAL_COMMUNITY): Payer: Self-pay

## 2022-09-24 ENCOUNTER — Other Ambulatory Visit: Payer: Self-pay

## 2022-09-24 ENCOUNTER — Encounter (HOSPITAL_COMMUNITY): Payer: Self-pay

## 2022-10-07 DIAGNOSIS — K5909 Other constipation: Secondary | ICD-10-CM | POA: Diagnosis not present

## 2022-10-07 DIAGNOSIS — E039 Hypothyroidism, unspecified: Secondary | ICD-10-CM | POA: Diagnosis not present

## 2022-10-07 DIAGNOSIS — G8929 Other chronic pain: Secondary | ICD-10-CM | POA: Diagnosis not present

## 2022-10-07 DIAGNOSIS — Z780 Asymptomatic menopausal state: Secondary | ICD-10-CM | POA: Diagnosis not present

## 2022-10-07 DIAGNOSIS — Z6824 Body mass index (BMI) 24.0-24.9, adult: Secondary | ICD-10-CM | POA: Diagnosis not present

## 2022-10-07 DIAGNOSIS — J302 Other seasonal allergic rhinitis: Secondary | ICD-10-CM | POA: Diagnosis not present

## 2022-10-07 DIAGNOSIS — F908 Attention-deficit hyperactivity disorder, other type: Secondary | ICD-10-CM | POA: Diagnosis not present

## 2022-10-07 DIAGNOSIS — G4709 Other insomnia: Secondary | ICD-10-CM | POA: Diagnosis not present

## 2022-10-07 DIAGNOSIS — E559 Vitamin D deficiency, unspecified: Secondary | ICD-10-CM | POA: Diagnosis not present

## 2022-10-13 ENCOUNTER — Other Ambulatory Visit: Payer: Self-pay

## 2022-10-30 ENCOUNTER — Other Ambulatory Visit: Payer: Self-pay | Admitting: Family Medicine

## 2022-10-30 ENCOUNTER — Other Ambulatory Visit: Payer: Self-pay

## 2022-11-04 ENCOUNTER — Other Ambulatory Visit: Payer: Self-pay | Admitting: Family Medicine

## 2022-11-04 ENCOUNTER — Other Ambulatory Visit (HOSPITAL_COMMUNITY): Payer: Self-pay

## 2022-11-04 ENCOUNTER — Other Ambulatory Visit: Payer: Self-pay

## 2022-11-04 MED ORDER — LISDEXAMFETAMINE DIMESYLATE 60 MG PO CAPS
60.0000 mg | ORAL_CAPSULE | ORAL | 0 refills | Status: DC
  Filled 2022-11-04 (×2): qty 15, 15d supply, fill #0

## 2022-11-04 NOTE — Telephone Encounter (Signed)
Will send in partial fill. She needs office visit.

## 2022-11-04 NOTE — Telephone Encounter (Signed)
Need OV for refills Last visit 12/31/2021 Last refill 08/26/2022

## 2022-11-05 ENCOUNTER — Other Ambulatory Visit: Payer: Self-pay

## 2022-11-06 ENCOUNTER — Other Ambulatory Visit: Payer: Self-pay

## 2022-11-06 ENCOUNTER — Other Ambulatory Visit (HOSPITAL_COMMUNITY): Payer: Self-pay

## 2022-11-06 MED ORDER — AMPHETAMINE-DEXTROAMPHET ER 30 MG PO CP24
30.0000 mg | ORAL_CAPSULE | Freq: Every day | ORAL | 0 refills | Status: DC
  Filled 2022-11-06: qty 30, 30d supply, fill #0

## 2022-11-11 ENCOUNTER — Other Ambulatory Visit (HOSPITAL_COMMUNITY): Payer: Self-pay

## 2022-11-20 ENCOUNTER — Other Ambulatory Visit (HOSPITAL_COMMUNITY): Payer: Self-pay

## 2022-11-26 ENCOUNTER — Other Ambulatory Visit: Payer: Self-pay

## 2022-12-02 ENCOUNTER — Other Ambulatory Visit: Payer: Self-pay

## 2022-12-02 ENCOUNTER — Other Ambulatory Visit (HOSPITAL_COMMUNITY): Payer: Self-pay

## 2022-12-03 ENCOUNTER — Other Ambulatory Visit (HOSPITAL_COMMUNITY): Payer: Self-pay

## 2022-12-03 ENCOUNTER — Other Ambulatory Visit: Payer: Self-pay

## 2022-12-04 ENCOUNTER — Other Ambulatory Visit (HOSPITAL_COMMUNITY): Payer: Self-pay

## 2022-12-04 MED ORDER — AMPHETAMINE-DEXTROAMPHET ER 30 MG PO CP24
30.0000 mg | ORAL_CAPSULE | Freq: Every day | ORAL | 0 refills | Status: DC
  Filled 2022-12-04 – 2023-01-01 (×4): qty 30, 30d supply, fill #0

## 2022-12-05 ENCOUNTER — Other Ambulatory Visit: Payer: Self-pay

## 2022-12-07 ENCOUNTER — Other Ambulatory Visit: Payer: Self-pay | Admitting: Family Medicine

## 2022-12-07 DIAGNOSIS — F5102 Adjustment insomnia: Secondary | ICD-10-CM

## 2022-12-08 ENCOUNTER — Other Ambulatory Visit (HOSPITAL_COMMUNITY): Payer: Self-pay

## 2022-12-08 ENCOUNTER — Other Ambulatory Visit: Payer: Self-pay

## 2022-12-08 MED ORDER — TRAZODONE HCL 50 MG PO TABS
ORAL_TABLET | ORAL | 2 refills | Status: AC
Start: 2022-12-08 — End: 2023-12-08
  Filled 2022-12-08: qty 90, fill #0

## 2022-12-11 DIAGNOSIS — H109 Unspecified conjunctivitis: Secondary | ICD-10-CM | POA: Diagnosis not present

## 2022-12-13 ENCOUNTER — Other Ambulatory Visit (HOSPITAL_COMMUNITY): Payer: Self-pay

## 2022-12-17 ENCOUNTER — Other Ambulatory Visit: Payer: Self-pay

## 2022-12-25 ENCOUNTER — Other Ambulatory Visit: Payer: Self-pay

## 2022-12-29 ENCOUNTER — Other Ambulatory Visit: Payer: Self-pay

## 2023-01-01 ENCOUNTER — Other Ambulatory Visit: Payer: Self-pay

## 2023-01-28 ENCOUNTER — Other Ambulatory Visit (HOSPITAL_COMMUNITY): Payer: Self-pay

## 2023-01-28 MED ORDER — AMPHETAMINE-DEXTROAMPHET ER 30 MG PO CP24
30.0000 mg | ORAL_CAPSULE | Freq: Every morning | ORAL | 0 refills | Status: DC
  Filled 2023-02-02 (×2): qty 30, 30d supply, fill #0

## 2023-01-28 MED ORDER — AMPHETAMINE-DEXTROAMPHETAMINE 10 MG PO TABS
10.0000 mg | ORAL_TABLET | Freq: Every day | ORAL | 0 refills | Status: DC
  Filled 2023-01-28 – 2023-02-02 (×3): qty 30, 30d supply, fill #0

## 2023-01-30 ENCOUNTER — Other Ambulatory Visit (HOSPITAL_COMMUNITY): Payer: Self-pay

## 2023-02-02 ENCOUNTER — Other Ambulatory Visit: Payer: Self-pay

## 2023-02-03 ENCOUNTER — Other Ambulatory Visit: Payer: Self-pay

## 2023-02-27 DIAGNOSIS — Z6824 Body mass index (BMI) 24.0-24.9, adult: Secondary | ICD-10-CM | POA: Diagnosis not present

## 2023-02-27 DIAGNOSIS — N390 Urinary tract infection, site not specified: Secondary | ICD-10-CM | POA: Diagnosis not present

## 2023-03-26 ENCOUNTER — Other Ambulatory Visit (HOSPITAL_COMMUNITY): Payer: Self-pay

## 2023-03-26 DIAGNOSIS — M5412 Radiculopathy, cervical region: Secondary | ICD-10-CM | POA: Diagnosis not present

## 2023-03-26 DIAGNOSIS — M4802 Spinal stenosis, cervical region: Secondary | ICD-10-CM | POA: Diagnosis not present

## 2023-03-26 MED ORDER — METHYLPREDNISOLONE 4 MG PO TBPK
ORAL_TABLET | ORAL | 0 refills | Status: DC
  Filled 2023-03-26: qty 21, 6d supply, fill #0

## 2023-03-26 MED ORDER — CYCLOBENZAPRINE HCL 10 MG PO TABS
10.0000 mg | ORAL_TABLET | Freq: Three times a day (TID) | ORAL | 0 refills | Status: DC | PRN
  Filled 2023-03-26: qty 30, 10d supply, fill #0

## 2023-04-07 DIAGNOSIS — M4722 Other spondylosis with radiculopathy, cervical region: Secondary | ICD-10-CM | POA: Diagnosis not present

## 2023-04-07 DIAGNOSIS — Z981 Arthrodesis status: Secondary | ICD-10-CM | POA: Diagnosis not present

## 2023-04-07 DIAGNOSIS — M4312 Spondylolisthesis, cervical region: Secondary | ICD-10-CM | POA: Diagnosis not present

## 2023-04-07 DIAGNOSIS — M4802 Spinal stenosis, cervical region: Secondary | ICD-10-CM | POA: Diagnosis not present

## 2023-04-07 DIAGNOSIS — M5412 Radiculopathy, cervical region: Secondary | ICD-10-CM | POA: Diagnosis not present

## 2023-05-05 DIAGNOSIS — M5412 Radiculopathy, cervical region: Secondary | ICD-10-CM | POA: Diagnosis not present

## 2023-06-15 DIAGNOSIS — G5603 Carpal tunnel syndrome, bilateral upper limbs: Secondary | ICD-10-CM | POA: Diagnosis not present

## 2023-06-17 DIAGNOSIS — G5603 Carpal tunnel syndrome, bilateral upper limbs: Secondary | ICD-10-CM | POA: Diagnosis not present

## 2023-06-22 DIAGNOSIS — E038 Other specified hypothyroidism: Secondary | ICD-10-CM | POA: Diagnosis not present

## 2023-06-22 DIAGNOSIS — F909 Attention-deficit hyperactivity disorder, unspecified type: Secondary | ICD-10-CM | POA: Diagnosis not present

## 2023-06-22 DIAGNOSIS — E161 Other hypoglycemia: Secondary | ICD-10-CM | POA: Diagnosis not present

## 2023-06-22 DIAGNOSIS — E559 Vitamin D deficiency, unspecified: Secondary | ICD-10-CM | POA: Diagnosis not present

## 2023-06-22 DIAGNOSIS — Z780 Asymptomatic menopausal state: Secondary | ICD-10-CM | POA: Diagnosis not present

## 2023-06-22 DIAGNOSIS — E782 Mixed hyperlipidemia: Secondary | ICD-10-CM | POA: Diagnosis not present

## 2023-06-22 DIAGNOSIS — Z6823 Body mass index (BMI) 23.0-23.9, adult: Secondary | ICD-10-CM | POA: Diagnosis not present

## 2023-07-13 DIAGNOSIS — U071 COVID-19: Secondary | ICD-10-CM | POA: Diagnosis not present

## 2023-07-13 DIAGNOSIS — J101 Influenza due to other identified influenza virus with other respiratory manifestations: Secondary | ICD-10-CM | POA: Diagnosis not present

## 2023-07-13 DIAGNOSIS — J9801 Acute bronchospasm: Secondary | ICD-10-CM | POA: Diagnosis not present

## 2023-07-13 DIAGNOSIS — J209 Acute bronchitis, unspecified: Secondary | ICD-10-CM | POA: Diagnosis not present

## 2023-07-13 DIAGNOSIS — J189 Pneumonia, unspecified organism: Secondary | ICD-10-CM | POA: Diagnosis not present

## 2023-07-15 ENCOUNTER — Other Ambulatory Visit: Payer: Self-pay

## 2023-07-15 ENCOUNTER — Other Ambulatory Visit (HOSPITAL_COMMUNITY): Payer: Self-pay

## 2023-07-15 MED ORDER — AZITHROMYCIN 250 MG PO TABS
ORAL_TABLET | ORAL | 0 refills | Status: DC
  Filled 2023-07-15 (×2): qty 6, 5d supply, fill #0

## 2023-07-15 MED ORDER — ALBUTEROL SULFATE HFA 108 (90 BASE) MCG/ACT IN AERS
1.0000 | INHALATION_SPRAY | RESPIRATORY_TRACT | 0 refills | Status: DC | PRN
  Filled 2023-07-15: qty 6.7, 25d supply, fill #0
  Filled 2023-07-15: qty 6.7, 17d supply, fill #0

## 2023-08-14 ENCOUNTER — Other Ambulatory Visit (HOSPITAL_COMMUNITY): Payer: Self-pay

## 2023-08-14 MED ORDER — HYDROCODONE-ACETAMINOPHEN 5-325 MG PO TABS
1.0000 | ORAL_TABLET | ORAL | 0 refills | Status: DC | PRN
  Filled 2023-08-14: qty 30, 5d supply, fill #0

## 2023-09-14 ENCOUNTER — Other Ambulatory Visit: Payer: Self-pay | Admitting: Family Medicine

## 2023-09-14 ENCOUNTER — Other Ambulatory Visit (HOSPITAL_COMMUNITY): Payer: Self-pay

## 2023-09-14 DIAGNOSIS — G8929 Other chronic pain: Secondary | ICD-10-CM

## 2023-09-14 MED ORDER — MELOXICAM 15 MG PO TABS
15.0000 mg | ORAL_TABLET | Freq: Every day | ORAL | 3 refills | Status: DC
  Filled 2023-10-15: qty 90, 90d supply, fill #0
  Filled 2024-01-04: qty 90, 90d supply, fill #1
  Filled 2024-04-07: qty 90, 90d supply, fill #2
  Filled 2024-07-06: qty 90, 90d supply, fill #3

## 2023-09-14 MED ORDER — TRAZODONE HCL 50 MG PO TABS
25.0000 mg | ORAL_TABLET | Freq: Every evening | ORAL | 1 refills | Status: DC | PRN
  Filled 2023-09-14 (×2): qty 90, 90d supply, fill #0
  Filled 2024-01-04: qty 90, 90d supply, fill #1

## 2023-09-14 MED ORDER — MONTELUKAST SODIUM 10 MG PO TABS
10.0000 mg | ORAL_TABLET | Freq: Every day | ORAL | 3 refills | Status: DC
  Filled 2023-10-29: qty 90, 90d supply, fill #0
  Filled 2024-01-04 – 2024-01-05 (×2): qty 90, 90d supply, fill #1
  Filled 2024-04-22: qty 90, 90d supply, fill #2
  Filled 2024-07-06: qty 90, 90d supply, fill #3

## 2023-09-14 MED ORDER — LEVOTHYROXINE SODIUM 88 MCG PO TABS
88.0000 ug | ORAL_TABLET | Freq: Every morning | ORAL | 0 refills | Status: DC
Start: 2023-09-14 — End: 2024-01-05
  Filled 2023-10-29: qty 90, 90d supply, fill #0

## 2023-09-14 MED ORDER — ESTRADIOL 1 MG PO TABS
1.0000 mg | ORAL_TABLET | Freq: Every day | ORAL | 1 refills | Status: DC
  Filled 2023-09-14 – 2023-10-15 (×2): qty 90, 90d supply, fill #0
  Filled 2024-01-04: qty 90, 90d supply, fill #1

## 2023-09-14 MED ORDER — LINZESS 290 MCG PO CAPS
290.0000 ug | ORAL_CAPSULE | Freq: Every day | ORAL | 3 refills | Status: DC
  Filled 2023-10-29: qty 90, 90d supply, fill #0
  Filled 2024-01-04 – 2024-01-05 (×2): qty 90, 90d supply, fill #1
  Filled 2024-04-22: qty 90, 90d supply, fill #2
  Filled 2024-06-09: qty 90, 90d supply, fill #3

## 2023-09-16 ENCOUNTER — Other Ambulatory Visit: Payer: Self-pay

## 2023-09-16 ENCOUNTER — Other Ambulatory Visit (HOSPITAL_COMMUNITY): Payer: Self-pay

## 2023-09-16 MED ORDER — AMPHETAMINE-DEXTROAMPHET ER 30 MG PO CP24
30.0000 mg | ORAL_CAPSULE | Freq: Every morning | ORAL | 0 refills | Status: DC
  Filled 2023-10-29: qty 30, 30d supply, fill #0

## 2023-09-16 MED ORDER — GABAPENTIN 300 MG PO CAPS
1200.0000 mg | ORAL_CAPSULE | Freq: Every day | ORAL | 3 refills | Status: DC
  Filled 2023-09-16: qty 360, 90d supply, fill #0
  Filled 2023-12-12: qty 360, 90d supply, fill #1
  Filled 2024-03-10: qty 360, 90d supply, fill #2
  Filled 2024-06-08: qty 360, 90d supply, fill #3

## 2023-09-16 MED ORDER — AMPHETAMINE-DEXTROAMPHETAMINE 10 MG PO TABS
10.0000 mg | ORAL_TABLET | Freq: Every day | ORAL | 0 refills | Status: DC
  Filled 2023-09-16: qty 30, 30d supply, fill #0

## 2023-10-01 ENCOUNTER — Other Ambulatory Visit (HOSPITAL_COMMUNITY): Payer: Self-pay

## 2023-10-01 MED ORDER — ACETAMINOPHEN-CODEINE 300-30 MG PO TABS
1.0000 | ORAL_TABLET | Freq: Four times a day (QID) | ORAL | 0 refills | Status: DC | PRN
  Filled 2023-10-01 (×2): qty 12, 3d supply, fill #0

## 2023-10-07 ENCOUNTER — Other Ambulatory Visit (HOSPITAL_COMMUNITY): Payer: Self-pay

## 2023-10-15 ENCOUNTER — Other Ambulatory Visit: Payer: Self-pay

## 2023-10-15 ENCOUNTER — Other Ambulatory Visit (HOSPITAL_COMMUNITY): Payer: Self-pay

## 2023-10-28 ENCOUNTER — Other Ambulatory Visit (HOSPITAL_COMMUNITY): Payer: Self-pay

## 2023-10-28 MED ORDER — SILVER SULFADIAZINE 1 % EX CREA
TOPICAL_CREAM | CUTANEOUS | 0 refills | Status: DC
  Filled 2023-10-28: qty 50, 10d supply, fill #0

## 2023-10-29 ENCOUNTER — Other Ambulatory Visit: Payer: Self-pay

## 2023-10-29 ENCOUNTER — Other Ambulatory Visit (HOSPITAL_COMMUNITY): Payer: Self-pay

## 2023-10-29 ENCOUNTER — Encounter: Payer: Self-pay | Admitting: Pharmacist

## 2023-12-10 ENCOUNTER — Other Ambulatory Visit (HOSPITAL_COMMUNITY): Payer: Self-pay

## 2023-12-10 MED ORDER — AMPHETAMINE-DEXTROAMPHET ER 30 MG PO CP24
30.0000 mg | ORAL_CAPSULE | Freq: Every morning | ORAL | 0 refills | Status: DC
Start: 2023-12-10 — End: 2024-01-07
  Filled 2023-12-10: qty 30, 30d supply, fill #0

## 2023-12-14 ENCOUNTER — Other Ambulatory Visit (HOSPITAL_COMMUNITY): Payer: Self-pay

## 2024-01-04 ENCOUNTER — Other Ambulatory Visit (HOSPITAL_COMMUNITY): Payer: Self-pay

## 2024-01-05 ENCOUNTER — Other Ambulatory Visit: Payer: Self-pay

## 2024-01-05 ENCOUNTER — Other Ambulatory Visit (HOSPITAL_COMMUNITY): Payer: Self-pay

## 2024-01-05 MED ORDER — LEVOTHYROXINE SODIUM 88 MCG PO TABS
88.0000 ug | ORAL_TABLET | Freq: Every morning | ORAL | 0 refills | Status: DC
  Filled 2024-01-05: qty 90, 90d supply, fill #0

## 2024-01-06 ENCOUNTER — Other Ambulatory Visit: Payer: Self-pay

## 2024-01-07 ENCOUNTER — Other Ambulatory Visit (HOSPITAL_COMMUNITY): Payer: Self-pay

## 2024-01-07 MED ORDER — AMPHETAMINE-DEXTROAMPHET ER 30 MG PO CP24
30.0000 mg | ORAL_CAPSULE | Freq: Every morning | ORAL | 0 refills | Status: DC
  Filled 2024-01-08 – 2024-01-14 (×2): qty 30, 30d supply, fill #0

## 2024-01-08 ENCOUNTER — Other Ambulatory Visit (HOSPITAL_COMMUNITY): Payer: Self-pay

## 2024-01-14 ENCOUNTER — Other Ambulatory Visit: Payer: Self-pay

## 2024-01-14 ENCOUNTER — Other Ambulatory Visit (HOSPITAL_COMMUNITY): Payer: Self-pay

## 2024-01-19 ENCOUNTER — Other Ambulatory Visit (HOSPITAL_COMMUNITY): Payer: Self-pay

## 2024-01-19 MED ORDER — QULIPTA 60 MG PO TABS
60.0000 mg | ORAL_TABLET | Freq: Every day | ORAL | 0 refills | Status: DC
  Filled 2024-01-19: qty 30, 30d supply, fill #0

## 2024-01-30 ENCOUNTER — Other Ambulatory Visit (HOSPITAL_COMMUNITY): Payer: Self-pay

## 2024-02-03 ENCOUNTER — Other Ambulatory Visit (HOSPITAL_COMMUNITY): Payer: Self-pay

## 2024-02-03 MED ORDER — AMPHETAMINE-DEXTROAMPHET ER 30 MG PO CP24
30.0000 mg | ORAL_CAPSULE | Freq: Every morning | ORAL | 0 refills | Status: DC
  Filled 2024-04-03: qty 30, 30d supply, fill #0

## 2024-02-03 MED ORDER — AMPHETAMINE-DEXTROAMPHET ER 30 MG PO CP24: 30.0000 mg | ORAL_CAPSULE | Freq: Every morning | ORAL | 0 refills | Status: DC

## 2024-02-03 MED ORDER — AMPHETAMINE-DEXTROAMPHET ER 30 MG PO CP24
30.0000 mg | ORAL_CAPSULE | Freq: Every morning | ORAL | 0 refills | Status: DC
  Filled 2024-02-03 – 2024-02-10 (×4): qty 30, 30d supply, fill #0

## 2024-02-06 ENCOUNTER — Other Ambulatory Visit (HOSPITAL_COMMUNITY): Payer: Self-pay

## 2024-02-10 ENCOUNTER — Other Ambulatory Visit (HOSPITAL_COMMUNITY): Payer: Self-pay

## 2024-02-10 ENCOUNTER — Encounter (HOSPITAL_COMMUNITY): Payer: Self-pay

## 2024-02-12 ENCOUNTER — Other Ambulatory Visit: Payer: Self-pay

## 2024-02-12 ENCOUNTER — Telehealth (HOSPITAL_COMMUNITY): Payer: Self-pay

## 2024-02-12 ENCOUNTER — Other Ambulatory Visit (HOSPITAL_COMMUNITY): Payer: Self-pay

## 2024-02-12 NOTE — Telephone Encounter (Signed)
No PA is needed at this time

## 2024-03-10 ENCOUNTER — Other Ambulatory Visit (HOSPITAL_COMMUNITY): Payer: Self-pay

## 2024-03-29 ENCOUNTER — Other Ambulatory Visit (HOSPITAL_COMMUNITY): Payer: Self-pay

## 2024-04-01 ENCOUNTER — Other Ambulatory Visit (HOSPITAL_COMMUNITY): Payer: Self-pay

## 2024-04-04 ENCOUNTER — Other Ambulatory Visit: Payer: Self-pay

## 2024-04-04 ENCOUNTER — Other Ambulatory Visit (HOSPITAL_COMMUNITY): Payer: Self-pay

## 2024-04-04 MED ORDER — AMPHETAMINE-DEXTROAMPHET ER 30 MG PO CP24: 30.0000 mg | ORAL_CAPSULE | Freq: Every morning | ORAL | 0 refills | Status: DC

## 2024-04-04 MED ORDER — LEVOTHYROXINE SODIUM 88 MCG PO TABS
88.0000 ug | ORAL_TABLET | Freq: Every morning | ORAL | 1 refills | Status: DC
  Filled 2024-04-04: qty 90, 90d supply, fill #0
  Filled 2024-04-05 – 2024-04-06 (×2): qty 90, 90d supply, fill #1

## 2024-04-05 ENCOUNTER — Other Ambulatory Visit: Payer: Self-pay

## 2024-04-05 ENCOUNTER — Other Ambulatory Visit (HOSPITAL_COMMUNITY): Payer: Self-pay

## 2024-04-05 ENCOUNTER — Telehealth: Payer: Self-pay

## 2024-04-05 MED ORDER — ESTRADIOL 1 MG PO TABS
1.0000 mg | ORAL_TABLET | Freq: Every day | ORAL | 1 refills | Status: DC
  Filled 2024-04-05: qty 90, 90d supply, fill #0
  Filled 2024-04-07: qty 90, 90d supply, fill #1

## 2024-04-05 MED ORDER — TRAZODONE HCL 50 MG PO TABS
25.0000 mg | ORAL_TABLET | Freq: Every evening | ORAL | 1 refills | Status: DC | PRN
  Filled 2024-04-05: qty 90, 90d supply, fill #0
  Filled 2024-04-07: qty 90, 90d supply, fill #1

## 2024-04-05 NOTE — Telephone Encounter (Signed)
 Forwarding message below; appointment is scheduled.

## 2024-04-05 NOTE — Telephone Encounter (Signed)
 Copied from CRM [id number]. Topic: Appointments - Transfer of Care >> Apr 04, 2024  4:44 PM [facility] wrote: Pt is requesting to transfer FROM: Dr. Kennyth Pt is requesting to transfer TO: Dr. Prentiss Reason for requested transfer: Pt Preferance It is the responsibility of the team the patient would like to transfer to (Dr. Prentiss) to reach out to the patient if for any reason this transfer is not acceptable.

## 2024-04-06 ENCOUNTER — Other Ambulatory Visit (HOSPITAL_COMMUNITY): Payer: Self-pay

## 2024-04-07 ENCOUNTER — Other Ambulatory Visit (HOSPITAL_COMMUNITY): Payer: Self-pay

## 2024-04-07 ENCOUNTER — Other Ambulatory Visit: Payer: Self-pay

## 2024-04-22 ENCOUNTER — Other Ambulatory Visit (HOSPITAL_COMMUNITY): Payer: Self-pay

## 2024-04-22 ENCOUNTER — Other Ambulatory Visit: Payer: Self-pay

## 2024-04-25 ENCOUNTER — Other Ambulatory Visit (HOSPITAL_COMMUNITY): Payer: Self-pay

## 2024-04-25 MED ORDER — AMPHETAMINE-DEXTROAMPHET ER 30 MG PO CP24: 30.0000 mg | ORAL_CAPSULE | Freq: Every day | ORAL | 0 refills | Status: DC

## 2024-04-28 ENCOUNTER — Other Ambulatory Visit (HOSPITAL_COMMUNITY): Payer: Self-pay

## 2024-05-11 ENCOUNTER — Emergency Department (HOSPITAL_COMMUNITY)

## 2024-05-11 ENCOUNTER — Encounter (HOSPITAL_COMMUNITY): Payer: Self-pay

## 2024-05-11 ENCOUNTER — Other Ambulatory Visit: Payer: Self-pay

## 2024-05-11 ENCOUNTER — Emergency Department (HOSPITAL_COMMUNITY)
Admission: EM | Admit: 2024-05-11 | Discharge: 2024-05-11 | Disposition: A | Discharge status: Home / Self Care | Attending: Emergency Medicine | Admitting: Emergency Medicine

## 2024-05-11 DIAGNOSIS — N132 Hydronephrosis with renal and ureteral calculous obstruction: Secondary | ICD-10-CM | POA: Insufficient documentation

## 2024-05-11 DIAGNOSIS — M79605 Pain in left leg: Secondary | ICD-10-CM | POA: Insufficient documentation

## 2024-05-11 DIAGNOSIS — N200 Calculus of kidney: Secondary | ICD-10-CM

## 2024-05-11 DIAGNOSIS — D72829 Elevated white blood cell count, unspecified: Secondary | ICD-10-CM | POA: Insufficient documentation

## 2024-05-11 DIAGNOSIS — R109 Unspecified abdominal pain: Secondary | ICD-10-CM | POA: Diagnosis present

## 2024-05-11 LAB — COMPREHENSIVE METABOLIC PANEL WITH GFR
ALT: 26 U/L (ref 0–44)
AST: 34 U/L (ref 15–41)
Albumin: 4.5 g/dL (ref 3.5–5.0)
Alkaline Phosphatase: 95 U/L (ref 38–126)
Anion gap: 16 — ABNORMAL HIGH (ref 5–15)
BUN: 19 mg/dL (ref 8–23)
CO2: 21 mmol/L — ABNORMAL LOW (ref 22–32)
Calcium: 9.8 mg/dL (ref 8.9–10.3)
Chloride: 100 mmol/L (ref 98–111)
Creatinine, Ser: 1.02 mg/dL — ABNORMAL HIGH (ref 0.44–1.00)
GFR, Estimated: 59 mL/min — ABNORMAL LOW (ref >60)
Glucose, Bld: 120 mg/dL — ABNORMAL HIGH (ref 70–99)
Potassium: 4.3 mmol/L (ref 3.5–5.1)
Sodium: 136 mmol/L (ref 135–145)
Total Bilirubin: 0.4 mg/dL (ref 0.0–1.2)
Total Protein: 7.1 g/dL (ref 6.5–8.1)

## 2024-05-11 LAB — URINALYSIS, ROUTINE W REFLEX MICROSCOPIC
Bacteria, UA: NONE SEEN
Bilirubin Urine: NEGATIVE
Glucose, UA: NEGATIVE mg/dL
Ketones, ur: 20 mg/dL — AB
Nitrite: NEGATIVE
Protein, ur: NEGATIVE mg/dL
Specific Gravity, Urine: 1.028 (ref 1.005–1.030)
pH: 5 (ref 5.0–8.0)

## 2024-05-11 LAB — CBC WITH DIFFERENTIAL/PLATELET
Abs Immature Granulocytes: 0.07 K/uL (ref 0.00–0.07)
Basophils Absolute: 0.1 K/uL (ref 0.0–0.1)
Basophils Relative: 1 %
Eosinophils Absolute: 0.1 K/uL (ref 0.0–0.5)
Eosinophils Relative: 1 %
HCT: 43.3 % (ref 36.0–46.0)
Hemoglobin: 14.4 g/dL (ref 12.0–15.0)
Immature Granulocytes: 0 %
Lymphocytes Relative: 7 %
Lymphs Abs: 1.1 K/uL (ref 0.7–4.0)
MCH: 29.4 pg (ref 26.0–34.0)
MCHC: 33.3 g/dL (ref 30.0–36.0)
MCV: 88.4 fL (ref 80.0–100.0)
Monocytes Absolute: 1 K/uL (ref 0.1–1.0)
Monocytes Relative: 6 %
Neutro Abs: 13.5 K/uL — ABNORMAL HIGH (ref 1.7–7.7)
Neutrophils Relative %: 85 %
Platelets: 347 K/uL (ref 150–400)
RBC: 4.9 MIL/uL (ref 3.87–5.11)
RDW: 12.8 % (ref 11.5–15.5)
WBC: 15.9 K/uL — ABNORMAL HIGH (ref 4.0–10.5)
nRBC: 0 % (ref 0.0–0.2)

## 2024-05-11 LAB — LIPASE, BLOOD: Lipase: 19 U/L (ref 11–51)

## 2024-05-11 MED ORDER — HYDROMORPHONE HCL 1 MG/ML IJ SOLN
0.5000 mg | INTRAMUSCULAR | Status: DC | PRN
  Administered 2024-05-11: 0.5 mg via INTRAVENOUS
  Filled 2024-05-11: qty 0.5

## 2024-05-11 MED ORDER — OXYCODONE-ACETAMINOPHEN 5-325 MG PO TABS: 1.0000 | ORAL_TABLET | ORAL | 0 refills | Status: DC | PRN

## 2024-05-11 MED ORDER — KETOROLAC TROMETHAMINE 60 MG/2ML IM SOLN
30.0000 mg | Freq: Once | INTRAMUSCULAR | Status: AC
  Administered 2024-05-11: 30 mg via INTRAMUSCULAR
  Filled 2024-05-11: qty 2

## 2024-05-11 MED ORDER — OXYCODONE-ACETAMINOPHEN 5-325 MG PO TABS
1.0000 | ORAL_TABLET | Freq: Once | ORAL | Status: AC
  Administered 2024-05-11: 1 via ORAL
  Filled 2024-05-11: qty 1

## 2024-05-11 MED ORDER — TAMSULOSIN HCL 0.4 MG PO CAPS
0.4000 mg | ORAL_CAPSULE | Freq: Once | ORAL | Status: AC
  Administered 2024-05-11: 0.4 mg via ORAL
  Filled 2024-05-11: qty 1

## 2024-05-11 MED ORDER — LACTATED RINGERS IV BOLUS
1000.0000 mL | Freq: Once | INTRAVENOUS | Status: AC
  Administered 2024-05-11: 1000 mL via INTRAVENOUS

## 2024-05-11 MED ORDER — ONDANSETRON 4 MG PO TBDP
4.0000 mg | ORAL_TABLET | Freq: Once | ORAL | Status: AC | PRN
  Administered 2024-05-11: 4 mg via ORAL
  Filled 2024-05-11: qty 1

## 2024-05-11 MED ORDER — TAMSULOSIN HCL 0.4 MG PO CAPS: 0.4000 mg | ORAL_CAPSULE | Freq: Every day | ORAL | 0 refills | Status: DC

## 2024-05-11 MED ORDER — ONDANSETRON 4 MG PO TBDP: 4.0000 mg | ORAL_TABLET | Freq: Three times a day (TID) | ORAL | 0 refills | Status: AC | PRN

## 2024-05-11 MED ORDER — METOCLOPRAMIDE HCL 5 MG/ML IJ SOLN
10.0000 mg | Freq: Once | INTRAMUSCULAR | Status: AC
  Administered 2024-05-11: 10 mg via INTRAVENOUS
  Filled 2024-05-11: qty 2

## 2024-05-11 NOTE — ED Provider Notes (Signed)
 [place] EMERGENCY DEPARTMENT AT [facility] Provider Note   CSN: [id number] Arrival date & time: 05/11/24  1452     Patient presents with: Flank Pain   Kara Kennedy is a 68 y.o. female.    Flank Pain  Patient presents for left leg pain.  Medical history includes remote history of nephrolithiasis, anxiety, IBS.  This morning, she had onset of left flank pain.  Pain has been persistent and worsening in severity.  She has had nausea.  Her last episode of a kidney stone was over 40 years ago.  She does not currently see a urologist.     Prior to Admission medications   Medication Sig Start Date End Date Taking? Authorizing Provider  ondansetron  (ZOFRAN -ODT) 4 MG disintegrating tablet Take 1 tablet (4 mg total) by mouth every 8 (eight) hours as needed. 05/11/24  Yes Melvenia Motto, MD  oxyCODONE -acetaminophen  (PERCOCET/ROXICET) 5-325 MG tablet Take 1 tablet by mouth every 4 (four) hours as needed for up to 3 days for severe pain (pain score 7-10). 05/11/24 05/14/24 Yes Melvenia Motto, MD  tamsulosin (FLOMAX) 0.4 MG CAPS capsule Take 1 capsule (0.4 mg total) by mouth daily for 7 days. 05/11/24 05/18/24 Yes Melvenia Motto, MD  acetaminophen -codeine  (TYLENOL  #3) 300-30 MG tablet Take 1 tablet by mouth every 6 (six) hours as needed for pain. 10/01/23     albuterol  (VENTOLIN  HFA) 108 (90 Base) MCG/ACT inhaler Inhale 1-2 puffs into the lungs every 4 (four) to 6 (six) hours as needed. 07/15/23     amphetamine -dextroamphetamine  (ADDERALL XR) 30 MG 24 hr capsule Take 1 capsule (30 mg total) by mouth every morning. 01/28/23     amphetamine -dextroamphetamine  (ADDERALL XR) 30 MG 24 hr capsule Take 1 capsule (30 mg total) by mouth in the morning. 01/07/24     amphetamine -dextroamphetamine  (ADDERALL XR) 30 MG 24 hr capsule Take 1 capsule (30 mg total) by mouth every morning. 03/04/24     amphetamine -dextroamphetamine  (ADDERALL XR) 30 MG 24 hr capsule Take 1 capsule (30 mg total) by mouth every morning.  04/02/24     amphetamine -dextroamphetamine  (ADDERALL XR) 30 MG 24 hr capsule Take 1 capsule (30 mg total) by mouth every morning. 02/03/24     amphetamine -dextroamphetamine  (ADDERALL XR) 30 MG 24 hr capsule Take 1 capsule (30 mg total) by mouth every morning. 04/04/24     amphetamine -dextroamphetamine  (ADDERALL XR) 30 MG 24 hr capsule Take 1 capsule (30 mg total) by mouth daily. DNF 05/02/24 04/25/24     amphetamine -dextroamphetamine  (ADDERALL) 10 MG tablet Take 1 tablet (10 mg total) by mouth daily in the afternoon. 01/28/23     amphetamine -dextroamphetamine  (ADDERALL) 10 MG tablet Take 1 tablet (10 mg total) by mouth daily in the afternoon. 09/16/23     Atogepant  (QULIPTA ) 60 MG TABS Take 1 tablet (60 mg total) by mouth daily. 01/19/24     azithromycin  (ZITHROMAX ) 250 MG tablet Take 2 tablets by mouth once daily on day one and take 1 tablet by mouth on days 2-5 until gone. 5 days 07/15/23     cholecalciferol  (VITAMIN D ) 1000 units tablet Take 1,000 Units by mouth daily.    [provider]  COVID-19 At Home Antigen Test [facility] COVID-19 HOME TEST) KIT Use as directed within package instructions 08/13/21   [place], [facility]  cyclobenzaprine  (FLEXERIL ) 10 MG tablet Take 1 tablet (10 mg total) by mouth 3 (three) times daily as needed for muscle spasms. 03/26/23     estradiol  (ESTRACE ) 1 MG tablet Take  1 tablet (1 mg total) by mouth daily. 04/28/22   Kennyth Worth HERO, MD  estradiol  (ESTRACE ) 1 MG tablet Take 1 tablet (1 mg total) by mouth daily. 04/05/24     gabapentin  (NEURONTIN ) 300 MG capsule Take 4 capsules by mouth at bedtime. 12/27/21 12/27/22  Kennyth Worth HERO, MD  gabapentin  (NEURONTIN ) 300 MG capsule Take 4 capsules (1,200 mg total) by mouth at bedtime. 09/16/23     HYDROcodone -acetaminophen  (NORCO/VICODIN) 5-325 MG tablet take 1 tablet by mouth every 4 hours as needed for pain 08/14/23     levothyroxine  (SYNTHROID ) 88 MCG tablet Take 1 tablet (88 mcg total) by mouth in the morning on an empty stomach.  01/05/24     levothyroxine  (SYNTHROID ) 88 MCG tablet Take 1 tablet (88 mcg total) by mouth every morning on an empty stomach. 04/04/24     linaclotide  (LINZESS ) 290 MCG CAPS capsule Take 1 capsule (290 mcg total) by mouth daily before breakfast. 07/22/22   Kennyth Worth HERO, MD  linaclotide  (LINZESS ) 290 MCG CAPS capsule Take 1 capsule (290 mcg total) at least 30 minutes before the first meal of the day on an empty stomach. 09/14/23     lisdexamfetamine (VYVANSE ) 60 MG capsule Take 1 capsule (60 mg total) by mouth every morning. Needs office visit for further refills. 11/04/22   Kennyth Worth HERO, MD  loratadine (CLARITIN) 10 MG tablet Take 10 mg by mouth daily.    [provider]  meloxicam  (MOBIC ) 15 MG tablet Take 1 tablet (15 mg total) by mouth daily. 07/22/22   Kennyth Worth HERO, MD  meloxicam  (MOBIC ) 15 MG tablet Take 1 tablet (15 mg total) by mouth daily. 09/14/23     montelukast  (SINGULAIR ) 10 MG tablet Take 1 tablet (10 mg total) by mouth at bedtime. 08/26/22   Kennyth Worth HERO, MD  montelukast  (SINGULAIR ) 10 MG tablet Take 1 tablet (10 mg total) by mouth at bedtime. 09/14/23     silver  sulfADIAZINE  (SILVADENE ) 1 % cream Apply 1 application topically twice daily for 10 days as directed. 10/28/23     SYNTHROID  88 MCG tablet Take 1 tablet (88 mcg total) by mouth every morning. 07/22/22 07/22/23  Kennyth Worth HERO, MD  traZODone  (DESYREL ) 50 MG tablet TAKE 1/2-1 TABLET BY MOUTH AT BEDTIME AS NEEDED FOR SLEEP. 12/08/22 12/08/23  Kennyth Worth HERO, MD  traZODone  (DESYREL ) 50 MG tablet Take 0.5-1 tablets (25-50 mg total) by mouth at bedtime as needed for sleep. 04/05/24       Allergies: Depo-medrol  [methylprednisolone  sodium succ] and Sulfamethoxazole-trimethoprim    Review of Systems  Gastrointestinal:  Positive for nausea.  Genitourinary:  Positive for decreased urine volume and flank pain.  All other systems reviewed and are negative.   Updated Vital Signs BP 139/75   Pulse 80   Temp (!) 97.1 F  (36.2 C) (Temporal)   Resp 18   Ht 5' 3 (1.6 m)   Wt 63.9 kg   LMP  (LMP Unknown)   SpO2 100%   BMI 24.95 kg/m   Physical Exam Vitals and nursing note reviewed.  Constitutional:      General: She is not in acute distress.    Appearance: Normal appearance. She is well-developed. She is not ill-appearing, toxic-appearing or diaphoretic.  HENT:     Head: Normocephalic and atraumatic.     Right Ear: External ear normal.     Left Ear: External ear normal.     Nose: Nose normal.     Mouth/Throat:  Mouth: Mucous membranes are moist.  Eyes:     Extraocular Movements: Extraocular movements intact.     Conjunctiva/sclera: Conjunctivae normal.  Cardiovascular:     Rate and Rhythm: Normal rate and regular rhythm.  Pulmonary:     Effort: Pulmonary effort is normal. No respiratory distress.  Abdominal:     General: There is no distension.     Palpations: Abdomen is soft.     Tenderness: There is no abdominal tenderness.  Musculoskeletal:        General: No swelling. Normal range of motion.     Cervical back: Normal range of motion and neck supple.  Skin:    General: Skin is warm and dry.     Coloration: Skin is not jaundiced or pale.  Neurological:     General: No focal deficit present.     Mental Status: She is alert and oriented to person, place, and time.  Psychiatric:        Mood and Affect: Mood normal.        Behavior: Behavior normal.     (all labs ordered are listed, but only abnormal results are displayed) Labs Reviewed  COMPREHENSIVE METABOLIC PANEL WITH GFR - Abnormal; Notable for the following components:      Result Value   CO2 21 (*)    Glucose, Bld 120 (*)    Creatinine, Ser 1.02 (*)    GFR, Estimated 59 (*)    Anion gap 16 (*)    All other components within normal limits  URINALYSIS, ROUTINE W REFLEX MICROSCOPIC - Abnormal; Notable for the following components:   APPearance HAZY (*)    Hgb urine dipstick SMALL (*)    Ketones, ur 20 (*)     Leukocytes,Ua SMALL (*)    All other components within normal limits  CBC WITH DIFFERENTIAL/PLATELET - Abnormal; Notable for the following components:   WBC 15.9 (*)    Neutro Abs 13.5 (*)    All other components within normal limits  LIPASE, BLOOD    EKG: None  Radiology: CT Renal Stone Study Result Date: 05/11/2024 CLINICAL DATA:  Left flank pain. EXAM: CT ABDOMEN AND PELVIS WITHOUT CONTRAST TECHNIQUE: Multidetector CT imaging of the abdomen and pelvis was performed following the standard protocol without IV contrast. RADIATION DOSE REDUCTION: This exam was performed according to the departmental dose-optimization program which includes automated exposure control, adjustment of the mA and/or kV according to patient size and/or use of iterative reconstruction technique. COMPARISON:  None Available. FINDINGS: Lower chest: No acute abnormality. Hepatobiliary: No focal liver abnormality is seen. Status post cholecystectomy. No biliary dilatation. Pancreas: Unremarkable. No pancreatic ductal dilatation or surrounding inflammatory changes. Spleen: No splenic injury or perisplenic hematoma. Adrenals/Urinary Tract: Adrenal glands are unremarkable. Kidneys are normal in size, without focal lesions. A 9 mm obstructing renal calculus is seen at the left UPJ, with moderate to marked severity left-sided hydronephrosis and calyceal dilatation. An adjacent 6 mm renal calculus is noted within the left renal pelvis. The urinary bladder is poorly distended and subsequently limited in evaluation. Stomach/Bowel: Stomach is within normal limits. Appendix appears normal. No evidence of bowel wall thickening, distention, or inflammatory changes. Noninflamed diverticula are seen throughout the descending and sigmoid colon. Vascular/Lymphatic: Aortic atherosclerosis. No enlarged abdominal or pelvic lymph nodes. Reproductive: Status post hysterectomy. No adnexal masses. Other: No abdominal wall hernia or abnormality. No  abdominopelvic ascites. Musculoskeletal: Postoperative changes are seen within the lower lumbar spine. No acute osseous abnormality is identified. IMPRESSION: 1. 9  mm obstructing renal calculus at the left UPJ. 2. 6 mm nonobstructing renal calculus within the left renal pelvis. 3. Colonic diverticulosis. 4. Evidence of prior cholecystectomy and hysterectomy. 5. Postoperative changes within the lower lumbar spine. 6. Aortic atherosclerosis. Electronically Signed   By: Suzen Dials M.D.   On: 05/11/2024 15:35     Procedures   Medications Ordered in the ED  HYDROmorphone (DILAUDID) injection 0.5 mg (0.5 mg Intravenous Given 05/11/24 1720)  tamsulosin (FLOMAX) capsule 0.4 mg (has no administration in time range)  ondansetron  (ZOFRAN -ODT) disintegrating tablet 4 mg (4 mg Oral Given 05/11/24 1507)  oxyCODONE -acetaminophen  (PERCOCET/ROXICET) 5-325 MG per tablet 1 tablet (1 tablet Oral Given 05/11/24 1608)  ketorolac (TORADOL) injection 30 mg (30 mg Intramuscular Given 05/11/24 1607)  lactated ringers  bolus 1,000 mL (1,000 mLs Intravenous Bolus from Bag 05/11/24 1710)  metoCLOPramide (REGLAN) injection 10 mg (10 mg Intravenous Given 05/11/24 1716)                                    Medical Decision Making Amount and/or Complexity of Data Reviewed Labs: ordered. Radiology: ordered.  Risk Prescription drug management.   This patient presents to the ED for concern of left flank pain, this involves an extensive number of treatment options, and is a complaint that carries with it a high risk of complications and morbidity.  The differential diagnosis includes nephrolithiasis, UTI, colitis, constipation, musculoskeletal etiology   Co morbidities / Chronic conditions that complicate the patient evaluation  nephrolithiasis, anxiety, IBS   Additional history obtained:  Additional history obtained from EMR External records from outside source obtained and reviewed including N/A   Lab  Tests:  I Ordered, and personally interpreted labs.  The pertinent results include: Creatinine has mildly increased when compared to lab work from 2 years ago.  A leukocytosis is present.  Urinalysis is not consistent with UTI.   Imaging Studies ordered:  I ordered imaging studies including CT stone study I independently visualized and interpreted imaging which showed 9 mm left UPJ obstructing stone. I agree with the radiologist interpretation   Cardiac Monitoring: / EKG:  The patient was maintained on a cardiac monitor.  I personally viewed and interpreted the cardiac monitored which showed an underlying rhythm of: Sinus rhythm   Problem List / ED Course / Critical interventions / Medication management  Patient presenting with left flank pain since this morning.  She has associated nausea.  On arrival in the ED, vital signs are normal.  On exam, patient appears uncomfortable.  Prior to being bedded in the ED, CT scan was ordered.  CT scan shows a 10 mm obstructing stone at left UPJ.  Multimodal pain control and nausea medication were ordered.  Urinalysis is not consistent with UTI.  Patient did have resolution of pain and nausea while in the ED.  She was advised to call urology office first thing in the morning and to return for any worsening of symptoms.  She was discharged in stable condition. I ordered medication including Percocet, Toradol, Dilaudid for analgesia; Zofran  and Reglan for nausea; IV fluids for hydration Reevaluation of the patient after these medicines showed that the patient improved I have reviewed the patients home medicines and have made adjustments as needed  Social Determinants of Health:  Lives at home with husband     Final diagnoses:  Kidney stone    ED Discharge Orders  Ordered    oxyCODONE -acetaminophen  (PERCOCET/ROXICET) 5-325 MG tablet  Every 4 hours PRN        05/11/24 1751    ondansetron  (ZOFRAN -ODT) 4 MG disintegrating tablet  Every 8  hours PRN        05/11/24 1751    tamsulosin (FLOMAX) 0.4 MG CAPS capsule  Daily        05/11/24 1751               Melvenia Motto, MD 05/11/24 1751

## 2024-05-11 NOTE — ED Notes (Signed)
 ED Provider at bedside.

## 2024-05-11 NOTE — Discharge Instructions (Addendum)
 You have a large kidney stone in your proximal left ureter.  Call the telephone number below first thing in the morning to set up a follow-up appointment with a urologist.  Take ibuprofen and Tylenol  for pain.  Drink lots of fluids.  Prescriptions were sent to your pharmacy: - Oxycodone  is a narcotic pain medication.  Take only as needed. - Ondansetron  is a medication to treat nausea.  Take as needed. - Tamsulosin is a medication to relax smooth muscle cells and help pass stone.  Take this daily.  If you have any worsening of symptoms, return to the emergency department.

## 2024-05-11 NOTE — ED Triage Notes (Signed)
 Pt arrived via POV c/o left flank pain that began this morning. Pt endorses nausea and decreased urinary output.

## 2024-05-13 ENCOUNTER — Other Ambulatory Visit: Payer: Self-pay

## 2024-05-13 ENCOUNTER — Encounter: Payer: Self-pay | Admitting: Urology

## 2024-05-13 ENCOUNTER — Ambulatory Visit (HOSPITAL_COMMUNITY)
Admission: RE | Admit: 2024-05-13 | Discharge: 2024-05-13 | Disposition: A | Discharge status: Home / Self Care | Source: Ambulatory Visit | Attending: Urology | Admitting: Urology

## 2024-05-13 ENCOUNTER — Ambulatory Visit: Admitting: Urology

## 2024-05-13 VITALS — BP 94/61 | HR 88 | Temp 98.3°F

## 2024-05-13 DIAGNOSIS — N200 Calculus of kidney: Secondary | ICD-10-CM | POA: Insufficient documentation

## 2024-05-13 LAB — URINALYSIS, ROUTINE W REFLEX MICROSCOPIC
Bilirubin, UA: NEGATIVE
Glucose, UA: NEGATIVE
Nitrite, UA: NEGATIVE
Protein,UA: NEGATIVE
RBC, UA: NEGATIVE
Specific Gravity, UA: 1.015 (ref 1.005–1.030)
Urobilinogen, Ur: 1 mg/dL (ref 0.2–1.0)
pH, UA: 6 (ref 5.0–7.5)

## 2024-05-13 LAB — MICROSCOPIC EXAMINATION

## 2024-05-13 MED ORDER — KETOROLAC TROMETHAMINE 60 MG/2ML IM SOLN
60.0000 mg | Freq: Once | INTRAMUSCULAR | Status: AC
  Administered 2024-05-13: 60 mg via INTRAMUSCULAR

## 2024-05-13 MED ORDER — TAMSULOSIN HCL 0.4 MG PO CAPS: 0.4000 mg | ORAL_CAPSULE | Freq: Every day | ORAL | 0 refills | Status: DC

## 2024-05-13 MED ORDER — ONDANSETRON HCL 4 MG PO TABS: 4.0000 mg | ORAL_TABLET | Freq: Every day | ORAL | 1 refills | Status: DC | PRN

## 2024-05-13 MED ORDER — OXYCODONE-ACETAMINOPHEN 5-325 MG PO TABS: 1.0000 | ORAL_TABLET | ORAL | 0 refills | Status: DC | PRN

## 2024-05-13 NOTE — H&P (View-Only) (Signed)
 05/13/2024 12:11 PM   Kara Kennedy [birth year] [id number]  Referring provider: Prentiss Frieze, DO [address] [place],  [place] [address]  nephrolithiasis   HPI: Kara Kennedy is a [phone] here for evaluation of nephrolithiasis. She developed left flank pain Wednesday and presented to the ER and was diagnosed with a 9mm left proximal ureteral calculus. Her last stone event was in her 35s. She denies any worsening LUTS. No hematuria.    PMH: Past Medical History:  Diagnosis Date   Allergic rhinitis 07/30/2012   Allergy    Anxiety 03/06/2014   Chronic neck pain 09/06/2012   Colon polyps    Endometriosis    Family history of colon cancer 12/29/2016   Family history of polyps in the colon    History of pyelonephritis 07/30/2012   Hypothyroidism 03/06/2014   IBS (irritable bowel syndrome) 12/29/2016   Insomnia 08/19/2011   Pneumonia    hx of 2009    PONV (postoperative nausea and vomiting)    Post menopausal syndrome 03/06/2014    Surgical History: Past Surgical History:  Procedure Laterality Date   ABDOMINAL HYSTERECTOMY     cervical fusion C5-C7      CESAREAN SECTION     DILATION AND CURETTAGE OF UTERUS     GALLBLADDER SURGERY     KNEE ARTHROSCOPY WITH MEDIAL MENISECTOMY Left 04/30/2018   Procedure: LEFT KNEE ARTHROSCOPY WITH DEBRIDEMENT;  Surgeon: Vernetta Lonni GRADE, MD;  Location: [facility];  Service: Orthopedics;  Laterality: Left;   SPINE SURGERY      Home Medications:  Allergies as of 05/13/2024       Reactions   Depo-medrol  [methylprednisolone  Sodium Succ] Swelling   After knee injection   Sulfamethoxazole-trimethoprim    Other reaction(s): Unknown        Medication List        Accurate as of May 13, 2024 12:11 PM. If you have any questions, ask your nurse or doctor.          acetaminophen -codeine  300-30 MG tablet Commonly known as: TYLENOL  #3 Take 1 tablet by mouth every 6 (six) hours as needed for pain.   albuterol  108 (90 Base) MCG/ACT  inhaler Commonly known as: VENTOLIN  HFA Inhale 1-2 puffs into the lungs every 4 (four) to 6 (six) hours as needed.   amphetamine -dextroamphetamine  30 MG 24 hr capsule Commonly known as: ADDERALL XR Take 1 capsule (30 mg total) by mouth every morning.   amphetamine -dextroamphetamine  10 MG tablet Commonly known as: ADDERALL Take 1 tablet (10 mg total) by mouth daily in the afternoon.   amphetamine -dextroamphetamine  10 MG tablet Commonly known as: ADDERALL Take 1 tablet (10 mg total) by mouth daily in the afternoon.   amphetamine -dextroamphetamine  30 MG 24 hr capsule Commonly known as: ADDERALL XR Take 1 capsule (30 mg total) by mouth in the morning.   amphetamine -dextroamphetamine  30 MG 24 hr capsule Commonly known as: ADDERALL XR Take 1 capsule (30 mg total) by mouth every morning.   amphetamine -dextroamphetamine  30 MG 24 hr capsule Commonly known as: ADDERALL XR Take 1 capsule (30 mg total) by mouth every morning.   amphetamine -dextroamphetamine  30 MG 24 hr capsule Commonly known as: ADDERALL XR Take 1 capsule (30 mg total) by mouth every morning.   amphetamine -dextroamphetamine  30 MG 24 hr capsule Commonly known as: ADDERALL XR Take 1 capsule (30 mg total) by mouth every morning.   amphetamine -dextroamphetamine  30 MG 24 hr capsule Commonly known as: ADDERALL XR Take 1 capsule (30 mg total) by mouth daily.  DNF 05/02/24   azithromycin  250 MG tablet Commonly known as: ZITHROMAX  Take 2 tablets by mouth once daily on day one and take 1 tablet by mouth on days 2-5 until gone. 5 days   Carestart COVID-19 Home Test Kit Generic drug: COVID-19 At Home Antigen Test Use as directed within package instructions   cholecalciferol  1000 units tablet Commonly known as: VITAMIN D  Take 1,000 Units by mouth daily.   cyclobenzaprine  10 MG tablet Commonly known as: FLEXERIL  Take 1 tablet (10 mg total) by mouth 3 (three) times daily as needed for muscle spasms.   estradiol  1 MG  tablet Commonly known as: Estrace  Take 1 tablet (1 mg total) by mouth daily.   estradiol  1 MG tablet Commonly known as: ESTRACE  Take 1 tablet (1 mg total) by mouth daily.   gabapentin  300 MG capsule Commonly known as: NEURONTIN  Take 4 capsules by mouth at bedtime.   gabapentin  300 MG capsule Commonly known as: NEURONTIN  Take 4 capsules (1,200 mg total) by mouth at bedtime.   HYDROcodone -acetaminophen  5-325 MG tablet Commonly known as: NORCO/VICODIN take 1 tablet by mouth every 4 hours as needed for pain   Linzess  290 MCG Caps capsule Generic drug: linaclotide  Take 1 capsule (290 mcg total) by mouth daily before breakfast.   Linzess  290 MCG Caps capsule Generic drug: linaclotide  Take 1 capsule (290 mcg total) at least 30 minutes before the first meal of the day on an empty stomach.   lisdexamfetamine 60 MG capsule Commonly known as: Vyvanse  Take 1 capsule (60 mg total) by mouth every morning. Needs office visit for further refills.   loratadine 10 MG tablet Commonly known as: CLARITIN Take 10 mg by mouth daily.   meloxicam  15 MG tablet Commonly known as: MOBIC  Take 1 tablet (15 mg total) by mouth daily.   meloxicam  15 MG tablet Commonly known as: MOBIC  Take 1 tablet (15 mg total) by mouth daily.   montelukast  10 MG tablet Commonly known as: SINGULAIR  Take 1 tablet (10 mg total) by mouth at bedtime.   montelukast  10 MG tablet Commonly known as: SINGULAIR  Take 1 tablet (10 mg total) by mouth at bedtime.   ondansetron  4 MG disintegrating tablet Commonly known as: ZOFRAN -ODT Take 1 tablet (4 mg total) by mouth every 8 (eight) hours as needed.   oxyCODONE -acetaminophen  5-325 MG tablet Commonly known as: PERCOCET/ROXICET Take 1 tablet by mouth every 4 (four) hours as needed for up to 3 days for severe pain (pain score 7-10).   Qulipta  60 MG Tabs Generic drug: Atogepant  Take 1 tablet (60 mg total) by mouth daily.   SSD 1 % cream Generic drug: silver   sulfADIAZINE  Apply 1 application topically twice daily for 10 days as directed.   Synthroid  88 MCG tablet Generic drug: levothyroxine  Take 1 tablet (88 mcg total) by mouth every morning.   levothyroxine  88 MCG tablet Commonly known as: Synthroid  Take 1 tablet (88 mcg total) by mouth in the morning on an empty stomach.   levothyroxine  88 MCG tablet Commonly known as: Synthroid  Take 1 tablet (88 mcg total) by mouth every morning on an empty stomach.   tamsulosin 0.4 MG Caps capsule Commonly known as: FLOMAX Take 1 capsule (0.4 mg total) by mouth daily for 7 days.   traZODone  50 MG tablet Commonly known as: DESYREL  TAKE 1/2-1 TABLET BY MOUTH AT BEDTIME AS NEEDED FOR SLEEP.   traZODone  50 MG tablet Commonly known as: DESYREL  Take 0.5-1 tablets (25-50 mg total) by mouth at bedtime as needed  for sleep.        Allergies:  Allergies  Allergen Reactions   Depo-Medrol  [Methylprednisolone  Sodium Succ] Swelling    After knee injection   Sulfamethoxazole-Trimethoprim     Other reaction(s): Unknown    Family History: Family History  Problem Relation Age of Onset   Colon cancer Mother 24       d. 62   Diabetes Father    Heart disease Father    Brain cancer Maternal Grandmother 4       d. 69   Diabetes Paternal Grandfather    Colon polyps Sister    Other Brother        RAD51D pos   Colon polyps Brother    Colon polyps Brother    Liver cancer Brother    Esophageal cancer Neg Hx    Rectal cancer Neg Hx    Stomach cancer Neg Hx     Social History:  reports that she has quit smoking. She has been exposed to tobacco smoke. She has never used smokeless tobacco. She reports current alcohol use of about 7.0 standard drinks of alcohol per week. She reports that she does not use drugs.  ROS: All other review of systems were reviewed and are negative except what is noted above in HPI  Physical Exam: BP 94/61   Pulse 88   Temp 98.3 F (36.8 C)   LMP  (LMP Unknown)    Constitutional:  Alert and oriented, No acute distress. HEENT: [place] AT, moist mucus membranes.  Trachea midline, no masses. Cardiovascular: No clubbing, cyanosis, or edema. Respiratory: Normal respiratory effort, no increased work of breathing. GI: Abdomen is soft, nontender, nondistended, no abdominal masses GU: No CVA tenderness.  Lymph: No cervical or inguinal lymphadenopathy. Skin: No rashes, bruises or suspicious lesions. Neurologic: Grossly intact, no focal deficits, moving all 4 extremities. Psychiatric: Normal mood and affect.  Laboratory Data: Lab Results  Component Value Date   WBC 15.9 (H) 05/11/2024   HGB 14.4 05/11/2024   HCT 43.3 05/11/2024   MCV 88.4 05/11/2024   PLT 347 05/11/2024    Lab Results  Component Value Date   CREATININE 1.02 (H) 05/11/2024    No results found for: PSA  No results found for: TESTOSTERONE  Lab Results  Component Value Date   HGBA1C 5.8 06/18/2021    Urinalysis    Component Value Date/Time   COLORURINE YELLOW 05/11/2024 1539   APPEARANCEUR HAZY (A) 05/11/2024 1539   LABSPEC 1.028 05/11/2024 1539   PHURINE 5.0 05/11/2024 1539   GLUCOSEU NEGATIVE 05/11/2024 1539   HGBUR SMALL (A) 05/11/2024 1539   BILIRUBINUR NEGATIVE 05/11/2024 1539   BILIRUBINUR 1 12/07/2018 1415   KETONESUR 20 (A) 05/11/2024 1539   PROTEINUR NEGATIVE 05/11/2024 1539   UROBILINOGEN 0.2 12/07/2018 1415   UROBILINOGEN 0.2 05/05/2008 2020   NITRITE NEGATIVE 05/11/2024 1539   LEUKOCYTESUR SMALL (A) 05/11/2024 1539    Lab Results  Component Value Date   BACTERIA NONE SEEN 05/11/2024    Pertinent Imaging: CT 05/11/24 and KUb today: Imahges reviewed and discussed with the patient  No results found for this or any previous visit.  No results found for this or any previous visit.  No results found for this or any previous visit.  No results found for this or any previous visit.  No results found for this or any previous visit.  No results found  for this or any previous visit.  No results found for this or any previous visit.  Results for orders placed during the hospital encounter of 05/11/24  CT Renal Stone Study  Narrative CLINICAL DATA:  Left flank pain.  EXAM: CT ABDOMEN AND PELVIS WITHOUT CONTRAST  TECHNIQUE: Multidetector CT imaging of the abdomen and pelvis was performed following the standard protocol without IV contrast.  RADIATION DOSE REDUCTION: This exam was performed according to the departmental dose-optimization program which includes automated exposure control, adjustment of the mA and/or kV according to patient size and/or use of iterative reconstruction technique.  COMPARISON:  None Available.  FINDINGS: Lower chest: No acute abnormality.  Hepatobiliary: No focal liver abnormality is seen. Status post cholecystectomy. No biliary dilatation.  Pancreas: Unremarkable. No pancreatic ductal dilatation or surrounding inflammatory changes.  Spleen: No splenic injury or perisplenic hematoma.  Adrenals/Urinary Tract: Adrenal glands are unremarkable. Kidneys are normal in size, without focal lesions. A 9 mm obstructing renal calculus is seen at the left UPJ, with moderate to marked severity left-sided hydronephrosis and calyceal dilatation. An adjacent 6 mm renal calculus is noted within the left renal pelvis. The urinary bladder is poorly distended and subsequently limited in evaluation.  Stomach/Bowel: Stomach is within normal limits. Appendix appears normal. No evidence of bowel wall thickening, distention, or inflammatory changes. Noninflamed diverticula are seen throughout the descending and sigmoid colon.  Vascular/Lymphatic: Aortic atherosclerosis. No enlarged abdominal or pelvic lymph nodes.  Reproductive: Status post hysterectomy. No adnexal masses.  Other: No abdominal wall hernia or abnormality. No abdominopelvic ascites.  Musculoskeletal: Postoperative changes are seen within the  lower lumbar spine. No acute osseous abnormality is identified.  IMPRESSION: 1. 9 mm obstructing renal calculus at the left UPJ. 2. 6 mm nonobstructing renal calculus within the left renal pelvis. 3. Colonic diverticulosis. 4. Evidence of prior cholecystectomy and hysterectomy. 5. Postoperative changes within the lower lumbar spine. 6. Aortic atherosclerosis.   Electronically Signed By: Suzen Dials M.D. On: 05/11/2024 15:35   Assessment & Plan:    1. Renal calculus (Primary) --We discussed the management of kidney stones. These options include observation, ureteroscopy, shockwave lithotripsy (ESWL) and percutaneous nephrolithotomy (PCNL). We discussed which options are relevant to the patient's stone(s). We discussed the natural history of kidney stones as well as the complications of untreated stones and the impact on quality of life without treatment as well as with each of the above listed treatments. We also discussed the efficacy of each treatment in its ability to clear the stone burden. With any of these management options I discussed the signs and symptoms of infection and the need for emergent treatment should these be experienced. For each option we discussed the ability of each procedure to clear the patient of their stone burden.   For observation I described the risks which include but are not limited to silent renal damage, life-threatening infection, need for emergent surgery, failure to pass stone and pain.   For ureteroscopy I described the risks which include bleeding, infection, damage to contiguous structures, positioning injury, ureteral stricture, ureteral avulsion, ureteral injury, need for prolonged ureteral stent, inability to perform ureteroscopy, need for an interval procedure, inability to clear stone burden, stent discomfort/pain, heart attack, stroke, pulmonary embolus and the inherent risks with general anesthesia.   For shockwave lithotripsy I described  the risks which include arrhythmia, kidney contusion, kidney hemorrhage, need for transfusion, pain, inability to adequately break up stone, inability to pass stone fragments, Steinstrasse, infection associated with obstructing stones, need for alternate surgical procedure, need for repeat shockwave lithotripsy, MI, CVA, PE and the inherent  risks with anesthesia/conscious sedation.   For PCNL I described the risks including positioning injury, pneumothorax, hydrothorax, need for chest tube, inability to clear stone burden, renal laceration, arterial venous fistula or malformation, need for embolization of kidney, loss of kidney or renal function, need for repeat procedure, need for prolonged nephrostomy tube, ureteral avulsion, MI, CVA, PE and the inherent risks of general anesthesia.   - The patient would like to proceed with left ESWL - Urinalysis, Routine w reflex microscopic   No follow-ups on file.  [name], MD  [facility] [place]

## 2024-05-13 NOTE — Progress Notes (Signed)
 Patient receiving Ketorolac  injection per MD order  The injection site was cleaned and prepped with alcohol. A band aid applied after injection given.   IM injection  Medication: Ketorolac Dose: 60 MG Location: left Ventrogluteal Lot: [id number]  Exp: 02/2024  Patient tolerated well, no complications were noted  Performed by: Carlos, CMA

## 2024-05-13 NOTE — Progress Notes (Signed)
 05/13/2024 12:11 PM   Kara Kennedy [birth year] [id number]  Referring provider: Prentiss Frieze, DO [address] [place],  [place] [address]  nephrolithiasis   HPI: Kara Kennedy is a [phone] here for evaluation of nephrolithiasis. She developed left flank pain Wednesday and presented to the ER and was diagnosed with a 9mm left proximal ureteral calculus. Her last stone event was in her 35s. She denies any worsening LUTS. No hematuria.    PMH: Past Medical History:  Diagnosis Date   Allergic rhinitis 07/30/2012   Allergy    Anxiety 03/06/2014   Chronic neck pain 09/06/2012   Colon polyps    Endometriosis    Family history of colon cancer 12/29/2016   Family history of polyps in the colon    History of pyelonephritis 07/30/2012   Hypothyroidism 03/06/2014   IBS (irritable bowel syndrome) 12/29/2016   Insomnia 08/19/2011   Pneumonia    hx of 2009    PONV (postoperative nausea and vomiting)    Post menopausal syndrome 03/06/2014    Surgical History: Past Surgical History:  Procedure Laterality Date   ABDOMINAL HYSTERECTOMY     cervical fusion C5-C7      CESAREAN SECTION     DILATION AND CURETTAGE OF UTERUS     GALLBLADDER SURGERY     KNEE ARTHROSCOPY WITH MEDIAL MENISECTOMY Left 04/30/2018   Procedure: LEFT KNEE ARTHROSCOPY WITH DEBRIDEMENT;  Surgeon: Vernetta Lonni GRADE, MD;  Location: [facility];  Service: Orthopedics;  Laterality: Left;   SPINE SURGERY      Home Medications:  Allergies as of 05/13/2024       Reactions   Depo-medrol  [methylprednisolone  Sodium Succ] Swelling   After knee injection   Sulfamethoxazole-trimethoprim    Other reaction(s): Unknown        Medication List        Accurate as of May 13, 2024 12:11 PM. If you have any questions, ask your nurse or doctor.          acetaminophen -codeine  300-30 MG tablet Commonly known as: TYLENOL  #3 Take 1 tablet by mouth every 6 (six) hours as needed for pain.   albuterol  108 (90 Base) MCG/ACT  inhaler Commonly known as: VENTOLIN  HFA Inhale 1-2 puffs into the lungs every 4 (four) to 6 (six) hours as needed.   amphetamine -dextroamphetamine  30 MG 24 hr capsule Commonly known as: ADDERALL XR Take 1 capsule (30 mg total) by mouth every morning.   amphetamine -dextroamphetamine  10 MG tablet Commonly known as: ADDERALL Take 1 tablet (10 mg total) by mouth daily in the afternoon.   amphetamine -dextroamphetamine  10 MG tablet Commonly known as: ADDERALL Take 1 tablet (10 mg total) by mouth daily in the afternoon.   amphetamine -dextroamphetamine  30 MG 24 hr capsule Commonly known as: ADDERALL XR Take 1 capsule (30 mg total) by mouth in the morning.   amphetamine -dextroamphetamine  30 MG 24 hr capsule Commonly known as: ADDERALL XR Take 1 capsule (30 mg total) by mouth every morning.   amphetamine -dextroamphetamine  30 MG 24 hr capsule Commonly known as: ADDERALL XR Take 1 capsule (30 mg total) by mouth every morning.   amphetamine -dextroamphetamine  30 MG 24 hr capsule Commonly known as: ADDERALL XR Take 1 capsule (30 mg total) by mouth every morning.   amphetamine -dextroamphetamine  30 MG 24 hr capsule Commonly known as: ADDERALL XR Take 1 capsule (30 mg total) by mouth every morning.   amphetamine -dextroamphetamine  30 MG 24 hr capsule Commonly known as: ADDERALL XR Take 1 capsule (30 mg total) by mouth daily.  DNF 05/02/24   azithromycin  250 MG tablet Commonly known as: ZITHROMAX  Take 2 tablets by mouth once daily on day one and take 1 tablet by mouth on days 2-5 until gone. 5 days   Carestart COVID-19 Home Test Kit Generic drug: COVID-19 At Home Antigen Test Use as directed within package instructions   cholecalciferol  1000 units tablet Commonly known as: VITAMIN D  Take 1,000 Units by mouth daily.   cyclobenzaprine  10 MG tablet Commonly known as: FLEXERIL  Take 1 tablet (10 mg total) by mouth 3 (three) times daily as needed for muscle spasms.   estradiol  1 MG  tablet Commonly known as: Estrace  Take 1 tablet (1 mg total) by mouth daily.   estradiol  1 MG tablet Commonly known as: ESTRACE  Take 1 tablet (1 mg total) by mouth daily.   gabapentin  300 MG capsule Commonly known as: NEURONTIN  Take 4 capsules by mouth at bedtime.   gabapentin  300 MG capsule Commonly known as: NEURONTIN  Take 4 capsules (1,200 mg total) by mouth at bedtime.   HYDROcodone -acetaminophen  5-325 MG tablet Commonly known as: NORCO/VICODIN take 1 tablet by mouth every 4 hours as needed for pain   Linzess  290 MCG Caps capsule Generic drug: linaclotide  Take 1 capsule (290 mcg total) by mouth daily before breakfast.   Linzess  290 MCG Caps capsule Generic drug: linaclotide  Take 1 capsule (290 mcg total) at least 30 minutes before the first meal of the day on an empty stomach.   lisdexamfetamine 60 MG capsule Commonly known as: Vyvanse  Take 1 capsule (60 mg total) by mouth every morning. Needs office visit for further refills.   loratadine 10 MG tablet Commonly known as: CLARITIN Take 10 mg by mouth daily.   meloxicam  15 MG tablet Commonly known as: MOBIC  Take 1 tablet (15 mg total) by mouth daily.   meloxicam  15 MG tablet Commonly known as: MOBIC  Take 1 tablet (15 mg total) by mouth daily.   montelukast  10 MG tablet Commonly known as: SINGULAIR  Take 1 tablet (10 mg total) by mouth at bedtime.   montelukast  10 MG tablet Commonly known as: SINGULAIR  Take 1 tablet (10 mg total) by mouth at bedtime.   ondansetron  4 MG disintegrating tablet Commonly known as: ZOFRAN -ODT Take 1 tablet (4 mg total) by mouth every 8 (eight) hours as needed.   oxyCODONE -acetaminophen  5-325 MG tablet Commonly known as: PERCOCET/ROXICET Take 1 tablet by mouth every 4 (four) hours as needed for up to 3 days for severe pain (pain score 7-10).   Qulipta  60 MG Tabs Generic drug: Atogepant  Take 1 tablet (60 mg total) by mouth daily.   SSD 1 % cream Generic drug: silver   sulfADIAZINE  Apply 1 application topically twice daily for 10 days as directed.   Synthroid  88 MCG tablet Generic drug: levothyroxine  Take 1 tablet (88 mcg total) by mouth every morning.   levothyroxine  88 MCG tablet Commonly known as: Synthroid  Take 1 tablet (88 mcg total) by mouth in the morning on an empty stomach.   levothyroxine  88 MCG tablet Commonly known as: Synthroid  Take 1 tablet (88 mcg total) by mouth every morning on an empty stomach.   tamsulosin 0.4 MG Caps capsule Commonly known as: FLOMAX Take 1 capsule (0.4 mg total) by mouth daily for 7 days.   traZODone  50 MG tablet Commonly known as: DESYREL  TAKE 1/2-1 TABLET BY MOUTH AT BEDTIME AS NEEDED FOR SLEEP.   traZODone  50 MG tablet Commonly known as: DESYREL  Take 0.5-1 tablets (25-50 mg total) by mouth at bedtime as needed  for sleep.        Allergies:  Allergies  Allergen Reactions   Depo-Medrol  [Methylprednisolone  Sodium Succ] Swelling    After knee injection   Sulfamethoxazole-Trimethoprim     Other reaction(s): Unknown    Family History: Family History  Problem Relation Age of Onset   Colon cancer Mother 24       d. 62   Diabetes Father    Heart disease Father    Brain cancer Maternal Grandmother 4       d. 69   Diabetes Paternal Grandfather    Colon polyps Sister    Other Brother        RAD51D pos   Colon polyps Brother    Colon polyps Brother    Liver cancer Brother    Esophageal cancer Neg Hx    Rectal cancer Neg Hx    Stomach cancer Neg Hx     Social History:  reports that she has quit smoking. She has been exposed to tobacco smoke. She has never used smokeless tobacco. She reports current alcohol use of about 7.0 standard drinks of alcohol per week. She reports that she does not use drugs.  ROS: All other review of systems were reviewed and are negative except what is noted above in HPI  Physical Exam: BP 94/61   Pulse 88   Temp 98.3 F (36.8 C)   LMP  (LMP Unknown)    Constitutional:  Alert and oriented, No acute distress. HEENT: [place] AT, moist mucus membranes.  Trachea midline, no masses. Cardiovascular: No clubbing, cyanosis, or edema. Respiratory: Normal respiratory effort, no increased work of breathing. GI: Abdomen is soft, nontender, nondistended, no abdominal masses GU: No CVA tenderness.  Lymph: No cervical or inguinal lymphadenopathy. Skin: No rashes, bruises or suspicious lesions. Neurologic: Grossly intact, no focal deficits, moving all 4 extremities. Psychiatric: Normal mood and affect.  Laboratory Data: Lab Results  Component Value Date   WBC 15.9 (H) 05/11/2024   HGB 14.4 05/11/2024   HCT 43.3 05/11/2024   MCV 88.4 05/11/2024   PLT 347 05/11/2024    Lab Results  Component Value Date   CREATININE 1.02 (H) 05/11/2024    No results found for: PSA  No results found for: TESTOSTERONE  Lab Results  Component Value Date   HGBA1C 5.8 06/18/2021    Urinalysis    Component Value Date/Time   COLORURINE YELLOW 05/11/2024 1539   APPEARANCEUR HAZY (A) 05/11/2024 1539   LABSPEC 1.028 05/11/2024 1539   PHURINE 5.0 05/11/2024 1539   GLUCOSEU NEGATIVE 05/11/2024 1539   HGBUR SMALL (A) 05/11/2024 1539   BILIRUBINUR NEGATIVE 05/11/2024 1539   BILIRUBINUR 1 12/07/2018 1415   KETONESUR 20 (A) 05/11/2024 1539   PROTEINUR NEGATIVE 05/11/2024 1539   UROBILINOGEN 0.2 12/07/2018 1415   UROBILINOGEN 0.2 05/05/2008 2020   NITRITE NEGATIVE 05/11/2024 1539   LEUKOCYTESUR SMALL (A) 05/11/2024 1539    Lab Results  Component Value Date   BACTERIA NONE SEEN 05/11/2024    Pertinent Imaging: CT 05/11/24 and KUb today: Imahges reviewed and discussed with the patient  No results found for this or any previous visit.  No results found for this or any previous visit.  No results found for this or any previous visit.  No results found for this or any previous visit.  No results found for this or any previous visit.  No results found  for this or any previous visit.  No results found for this or any previous visit.  Results for orders placed during the hospital encounter of 05/11/24  CT Renal Stone Study  Narrative CLINICAL DATA:  Left flank pain.  EXAM: CT ABDOMEN AND PELVIS WITHOUT CONTRAST  TECHNIQUE: Multidetector CT imaging of the abdomen and pelvis was performed following the standard protocol without IV contrast.  RADIATION DOSE REDUCTION: This exam was performed according to the departmental dose-optimization program which includes automated exposure control, adjustment of the mA and/or kV according to patient size and/or use of iterative reconstruction technique.  COMPARISON:  None Available.  FINDINGS: Lower chest: No acute abnormality.  Hepatobiliary: No focal liver abnormality is seen. Status post cholecystectomy. No biliary dilatation.  Pancreas: Unremarkable. No pancreatic ductal dilatation or surrounding inflammatory changes.  Spleen: No splenic injury or perisplenic hematoma.  Adrenals/Urinary Tract: Adrenal glands are unremarkable. Kidneys are normal in size, without focal lesions. A 9 mm obstructing renal calculus is seen at the left UPJ, with moderate to marked severity left-sided hydronephrosis and calyceal dilatation. An adjacent 6 mm renal calculus is noted within the left renal pelvis. The urinary bladder is poorly distended and subsequently limited in evaluation.  Stomach/Bowel: Stomach is within normal limits. Appendix appears normal. No evidence of bowel wall thickening, distention, or inflammatory changes. Noninflamed diverticula are seen throughout the descending and sigmoid colon.  Vascular/Lymphatic: Aortic atherosclerosis. No enlarged abdominal or pelvic lymph nodes.  Reproductive: Status post hysterectomy. No adnexal masses.  Other: No abdominal wall hernia or abnormality. No abdominopelvic ascites.  Musculoskeletal: Postoperative changes are seen within the  lower lumbar spine. No acute osseous abnormality is identified.  IMPRESSION: 1. 9 mm obstructing renal calculus at the left UPJ. 2. 6 mm nonobstructing renal calculus within the left renal pelvis. 3. Colonic diverticulosis. 4. Evidence of prior cholecystectomy and hysterectomy. 5. Postoperative changes within the lower lumbar spine. 6. Aortic atherosclerosis.   Electronically Signed By: Suzen Dials M.D. On: 05/11/2024 15:35   Assessment & Plan:    1. Renal calculus (Primary) --We discussed the management of kidney stones. These options include observation, ureteroscopy, shockwave lithotripsy (ESWL) and percutaneous nephrolithotomy (PCNL). We discussed which options are relevant to the patient's stone(s). We discussed the natural history of kidney stones as well as the complications of untreated stones and the impact on quality of life without treatment as well as with each of the above listed treatments. We also discussed the efficacy of each treatment in its ability to clear the stone burden. With any of these management options I discussed the signs and symptoms of infection and the need for emergent treatment should these be experienced. For each option we discussed the ability of each procedure to clear the patient of their stone burden.   For observation I described the risks which include but are not limited to silent renal damage, life-threatening infection, need for emergent surgery, failure to pass stone and pain.   For ureteroscopy I described the risks which include bleeding, infection, damage to contiguous structures, positioning injury, ureteral stricture, ureteral avulsion, ureteral injury, need for prolonged ureteral stent, inability to perform ureteroscopy, need for an interval procedure, inability to clear stone burden, stent discomfort/pain, heart attack, stroke, pulmonary embolus and the inherent risks with general anesthesia.   For shockwave lithotripsy I described  the risks which include arrhythmia, kidney contusion, kidney hemorrhage, need for transfusion, pain, inability to adequately break up stone, inability to pass stone fragments, Steinstrasse, infection associated with obstructing stones, need for alternate surgical procedure, need for repeat shockwave lithotripsy, MI, CVA, PE and the inherent  risks with anesthesia/conscious sedation.   For PCNL I described the risks including positioning injury, pneumothorax, hydrothorax, need for chest tube, inability to clear stone burden, renal laceration, arterial venous fistula or malformation, need for embolization of kidney, loss of kidney or renal function, need for repeat procedure, need for prolonged nephrostomy tube, ureteral avulsion, MI, CVA, PE and the inherent risks of general anesthesia.   - The patient would like to proceed with left ESWL - Urinalysis, Routine w reflex microscopic   No follow-ups on file.  [name], MD  [facility] [place]

## 2024-05-13 NOTE — Patient Instructions (Signed)
 ESWL for Kidney Stones  Extracorporeal shock wave lithotripsy (ESWL) is a treatment that can help break up kidney stones that are too large to pass on their own.  This is a nonsurgical procedure that breaks up a kidney stone with shock waves. These shock waves pass through your body and focus on the kidney stone. They cause the kidney stone to break into smaller pieces (fragments) while it is still in the urinary tract. The fragments of stone can pass more easily out of your body in the pee (urine). Tell a health care provider about: Any allergies you have. All medicines you are taking, including vitamins, herbs, eye drops, creams, and over-the-counter medicines. Any problems you or family members have had with anesthetic medicines. Any bleeding problems you have. Any surgeries you've had. Any medical conditions you have. Whether you're pregnant or may be pregnant. What are the risks? Your health care provider will talk with you about risks. These may include: Infection. Bleeding from the kidney. Bruising of the kidney or skin. Scarring of the kidney. This can lead to: Increased blood pressure. Poor kidney function. Return (recurrence) of kidney stones. Damage to other structures or organs. This may include the liver, colon, spleen, or pancreas. Blockage (obstruction) of the tube that carries pee from the kidney to the bladder (ureter). Failure of the kidney stone to break into fragments. What happens before the procedure? When to stop eating and drinking Follow instructions from your health care provider about what you may eat and drink. These may include: 8 hours before your procedure Stop eating most foods. Do not eat meat, fried foods, or fatty foods. Eat only light foods, such as toast or crackers. All liquids are okay except energy drinks and alcohol. 6 hours before your procedure Stop eating. Drink only clear liquids, such as water, clear fruit juice, black coffee, plain tea,  and sports drinks. Do not drink energy drinks or alcohol. 2 hours before your procedure Stop drinking all liquids. You may be allowed to take medicines with small sips of water. If you do not follow your health care provider's instructions, your procedure may be delayed or canceled. Medicines Ask your health care provider about: Changing or stopping your regular medicines. These include any diabetes medicines or blood thinners you take. Taking medicines such as aspirin and ibuprofen. These medicines can thin your blood. Do not take them unless your health care provider tells you to. Taking over-the-counter medicines, vitamins, herbs, and supplements. Tests You may have tests, such as: Blood tests. Pee (urine) tests. Imaging tests. This may include a CT scan. Surgery safety Ask your health care provider: How your surgery site will be marked. What steps will be taken to help prevent infection. These steps may include: Washing skin with a soap that kills germs. Receiving antibiotics. General instructions If you will be going home right after the procedure, plan to have a responsible adult: Take you home from the hospital or clinic. You will not be allowed to drive. Care for you for the time you are told. What happens during the procedure?  An IV will be inserted into one of your veins. You may be given: A sedative. This helps you relax. Anesthesia. This will: Numb certain areas of your body. Make you fall asleep for surgery. A water-filled cushion may be placed behind your kidney or on your abdomen. In some cases, you may be placed in a tub of lukewarm water. Your body will be positioned in a way that makes it  easier to target the kidney stone. An X-ray or ultrasound exam will be done to locate your stone. Shock waves will be aimed at the stone. If you are awake, you may feel a tapping sensation as the shock waves pass through your body. A small mesh tube (stent) may be placed in  your ureter. This will help keep pee flowing from the kidney if the fragments of the stone have been blocking the ureter. The stent will be removed at a later time by your health care provider. The procedure may vary among health care providers and hospitals. What happens after the procedure? Your blood pressure, heart rate, breathing rate, and blood oxygen level will be monitored until you leave the hospital or clinic. You may have an X-ray after the procedure to see how many of the kidney stones were broken up. This will also show how much of the stone has passed. If there are still large fragments after treatment, you may need to have a second procedure at a later time. This information is not intended to replace advice given to you by your health care provider. Make sure you discuss any questions you have with your health care provider. Document Revised: 01/24/2023 Document Reviewed: 11/14/2021 Elsevier Patient Education  2024 [employer].

## 2024-05-16 ENCOUNTER — Other Ambulatory Visit: Payer: Self-pay

## 2024-05-16 ENCOUNTER — Encounter (HOSPITAL_COMMUNITY)
Admission: RE | Admit: 2024-05-16 | Discharge: 2024-05-16 | Disposition: A | Discharge status: Home / Self Care | Source: Ambulatory Visit | Attending: Urology | Admitting: Urology

## 2024-05-16 ENCOUNTER — Encounter (HOSPITAL_COMMUNITY): Payer: Self-pay

## 2024-05-16 HISTORY — DX: Unspecified osteoarthritis, unspecified site: M19.90

## 2024-05-16 HISTORY — DX: Personal history of urinary calculi: Z87.442

## 2024-05-17 ENCOUNTER — Ambulatory Visit (HOSPITAL_COMMUNITY)

## 2024-05-17 ENCOUNTER — Other Ambulatory Visit: Payer: Self-pay

## 2024-05-17 ENCOUNTER — Other Ambulatory Visit (HOSPITAL_COMMUNITY): Payer: Self-pay

## 2024-05-17 ENCOUNTER — Encounter (HOSPITAL_COMMUNITY): Payer: Self-pay | Admitting: Urology

## 2024-05-17 ENCOUNTER — Encounter (HOSPITAL_COMMUNITY)
Admission: RE | Disposition: A | Discharge status: Home / Self Care | Payer: Self-pay | Source: Home / Self Care | Attending: Urology

## 2024-05-17 ENCOUNTER — Ambulatory Visit (HOSPITAL_COMMUNITY)
Admission: RE | Admit: 2024-05-17 | Discharge: 2024-05-17 | Disposition: A | Discharge status: Home / Self Care | Attending: Urology | Admitting: Urology

## 2024-05-17 DIAGNOSIS — N201 Calculus of ureter: Secondary | ICD-10-CM | POA: Diagnosis present

## 2024-05-17 DIAGNOSIS — I700 Atherosclerosis of aorta: Secondary | ICD-10-CM | POA: Diagnosis not present

## 2024-05-17 DIAGNOSIS — Z79899 Other long term (current) drug therapy: Secondary | ICD-10-CM | POA: Diagnosis not present

## 2024-05-17 DIAGNOSIS — N132 Hydronephrosis with renal and ureteral calculous obstruction: Secondary | ICD-10-CM | POA: Insufficient documentation

## 2024-05-17 DIAGNOSIS — Z87891 Personal history of nicotine dependence: Secondary | ICD-10-CM | POA: Insufficient documentation

## 2024-05-17 DIAGNOSIS — Z780 Asymptomatic menopausal state: Secondary | ICD-10-CM | POA: Insufficient documentation

## 2024-05-17 HISTORY — PX: EXTRACORPOREAL SHOCK WAVE LITHOTRIPSY: SHX1557

## 2024-05-17 SURGERY — LITHOTRIPSY, ESWL
Anesthesia: LOCAL | Laterality: Left

## 2024-05-17 MED ORDER — TAMSULOSIN HCL 0.4 MG PO CAPS
0.4000 mg | ORAL_CAPSULE | Freq: Every day | ORAL | 0 refills | Status: DC
  Filled 2024-05-17 (×2): qty 30, 30d supply, fill #0

## 2024-05-17 MED ORDER — ONDANSETRON HCL 4 MG PO TABS
4.0000 mg | ORAL_TABLET | Freq: Every day | ORAL | 1 refills | Status: DC | PRN
  Filled 2024-05-17 (×2): qty 30, 30d supply, fill #0

## 2024-05-17 MED ORDER — OXYCODONE-ACETAMINOPHEN 5-325 MG PO TABS
1.0000 | ORAL_TABLET | ORAL | 0 refills | Status: DC | PRN
  Filled 2024-05-17 (×2): qty 30, 5d supply, fill #0

## 2024-05-17 MED ORDER — DIAZEPAM 5 MG PO TABS
10.0000 mg | ORAL_TABLET | Freq: Once | ORAL | Status: AC
  Administered 2024-05-17: 10 mg via ORAL
  Filled 2024-05-17: qty 2

## 2024-05-17 MED ORDER — SODIUM CHLORIDE 0.9 % IV SOLN: INTRAVENOUS | Status: DC

## 2024-05-17 MED ORDER — DIPHENHYDRAMINE HCL 25 MG PO CAPS
25.0000 mg | ORAL_CAPSULE | ORAL | Status: AC
  Administered 2024-05-17: 25 mg via ORAL
  Filled 2024-05-17: qty 1

## 2024-05-17 NOTE — Interval H&P Note (Signed)
 History and Physical Interval Note:  05/17/2024 10:28 AM  Kara Kennedy  has presented today for surgery, with the diagnosis of Left Ureteral Stone.  The various methods of treatment have been discussed with the patient and family. After consideration of risks, benefits and other options for treatment, the patient has consented to  Procedure(s): LITHOTRIPSY, ESWL (Left) as a surgical intervention.  The patient's history has been reviewed, patient examined, no change in status, stable for surgery.  I have reviewed the patient's chart and labs.  Questions were answered to the patient's satisfaction.     [name]

## 2024-05-17 NOTE — Progress Notes (Signed)
 Assessed pt's left posterior side where treatment took place. Minimal red petechia noted and no complaints.

## 2024-05-18 ENCOUNTER — Other Ambulatory Visit (HOSPITAL_COMMUNITY): Payer: Self-pay

## 2024-05-18 ENCOUNTER — Telehealth: Payer: Self-pay

## 2024-05-18 NOTE — Telephone Encounter (Signed)
 Return call to pt about general question about how long she need to strain her urine, Per Dr. Sherrilee pt is the strain urine for 2 weeks. Pt voiced understanding

## 2024-06-01 ENCOUNTER — Other Ambulatory Visit (HOSPITAL_COMMUNITY): Payer: Self-pay

## 2024-06-06 ENCOUNTER — Other Ambulatory Visit: Payer: Self-pay

## 2024-06-06 DIAGNOSIS — N200 Calculus of kidney: Secondary | ICD-10-CM

## 2024-06-08 ENCOUNTER — Other Ambulatory Visit (HOSPITAL_COMMUNITY): Payer: Self-pay

## 2024-06-09 ENCOUNTER — Other Ambulatory Visit (HOSPITAL_COMMUNITY): Payer: Self-pay

## 2024-06-09 ENCOUNTER — Other Ambulatory Visit: Payer: Self-pay

## 2024-06-09 MED ORDER — ESTRADIOL 1 MG PO TABS
1.0000 mg | ORAL_TABLET | Freq: Every day | ORAL | 0 refills | Status: DC
  Filled 2024-06-16: qty 30, 30d supply, fill #0

## 2024-06-09 MED ORDER — GABAPENTIN 300 MG PO CAPS: 1200.0000 mg | ORAL_CAPSULE | Freq: Every day | ORAL | 0 refills | Status: AC

## 2024-06-09 MED ORDER — TRAZODONE HCL 50 MG PO TABS
25.0000 mg | ORAL_TABLET | Freq: Every evening | ORAL | 0 refills | Status: DC | PRN
  Filled 2024-06-16: qty 30, 30d supply, fill #0

## 2024-06-10 ENCOUNTER — Ambulatory Visit (HOSPITAL_COMMUNITY)
Admission: RE | Admit: 2024-06-10 | Discharge: 2024-06-10 | Disposition: A | Discharge status: Home / Self Care | Source: Ambulatory Visit | Attending: Urology | Admitting: Urology

## 2024-06-10 DIAGNOSIS — N200 Calculus of kidney: Secondary | ICD-10-CM | POA: Insufficient documentation

## 2024-06-15 ENCOUNTER — Encounter: Admitting: Urology

## 2024-06-15 ENCOUNTER — Telehealth: Payer: Self-pay

## 2024-06-15 NOTE — Telephone Encounter (Signed)
 Contact [facility] spoke to [place]. Norma state's there is a up to a 10 business day turn around for results. Madeline state's there is nothing that indicated that there is anything wrong with the specimen submitted.  Follow up called on patient's my chart message with no answer, lvm

## 2024-06-16 ENCOUNTER — Other Ambulatory Visit (HOSPITAL_COMMUNITY): Payer: Self-pay

## 2024-06-17 LAB — STONE ANALYSIS
Calcium Oxalate Monohydrate: 100 %
Weight Calculi: 13 mg

## 2024-06-21 ENCOUNTER — Other Ambulatory Visit: Payer: Self-pay

## 2024-06-21 ENCOUNTER — Encounter (HOSPITAL_COMMUNITY): Payer: Self-pay | Admitting: Urology

## 2024-06-21 ENCOUNTER — Other Ambulatory Visit (HOSPITAL_COMMUNITY): Payer: Self-pay

## 2024-06-21 MED ORDER — LEVOTHYROXINE SODIUM 88 MCG PO TABS
88.0000 ug | ORAL_TABLET | Freq: Every morning | ORAL | 0 refills | Status: DC
  Filled 2024-06-21: qty 30, 30d supply, fill #0

## 2024-07-06 ENCOUNTER — Other Ambulatory Visit (HOSPITAL_COMMUNITY): Payer: Self-pay

## 2024-07-07 ENCOUNTER — Ambulatory Visit: Admitting: Family Medicine

## 2024-07-07 ENCOUNTER — Encounter: Payer: Self-pay | Admitting: Family Medicine

## 2024-07-07 VITALS — BP 116/74 | HR 62 | Ht 63.0 in | Wt 139.0 lb

## 2024-07-07 DIAGNOSIS — R002 Palpitations: Secondary | ICD-10-CM

## 2024-07-07 DIAGNOSIS — I493 Ventricular premature depolarization: Secondary | ICD-10-CM

## 2024-07-07 DIAGNOSIS — N951 Menopausal and female climacteric states: Secondary | ICD-10-CM

## 2024-07-07 DIAGNOSIS — Z79899 Other long term (current) drug therapy: Secondary | ICD-10-CM

## 2024-07-07 DIAGNOSIS — G5600 Carpal tunnel syndrome, unspecified upper limb: Secondary | ICD-10-CM | POA: Insufficient documentation

## 2024-07-07 DIAGNOSIS — E039 Hypothyroidism, unspecified: Secondary | ICD-10-CM

## 2024-07-07 DIAGNOSIS — Z1231 Encounter for screening mammogram for malignant neoplasm of breast: Secondary | ICD-10-CM

## 2024-07-07 DIAGNOSIS — E559 Vitamin D deficiency, unspecified: Secondary | ICD-10-CM

## 2024-07-07 DIAGNOSIS — Z1211 Encounter for screening for malignant neoplasm of colon: Secondary | ICD-10-CM

## 2024-07-07 DIAGNOSIS — M5416 Radiculopathy, lumbar region: Secondary | ICD-10-CM | POA: Insufficient documentation

## 2024-07-07 DIAGNOSIS — F902 Attention-deficit hyperactivity disorder, combined type: Secondary | ICD-10-CM

## 2024-07-07 LAB — COMPREHENSIVE METABOLIC PANEL WITH GFR
ALT: 17 U/L (ref 0–35)
AST: 19 U/L (ref 0–37)
Albumin: 4.3 g/dL (ref 3.5–5.2)
Alkaline Phosphatase: 77 U/L (ref 39–117)
BUN: 11 mg/dL (ref 6–23)
CO2: 32 meq/L (ref 19–32)
Calcium: 9.3 mg/dL (ref 8.4–10.5)
Chloride: 103 meq/L (ref 96–112)
Creatinine, Ser: 0.7 mg/dL (ref 0.40–1.20)
GFR: 88.34 mL/min (ref >60.00)
Glucose, Bld: 101 mg/dL — ABNORMAL HIGH (ref 70–99)
Potassium: 4.1 meq/L (ref 3.5–5.1)
Sodium: 139 meq/L (ref 135–145)
Total Bilirubin: 0.3 mg/dL (ref 0.2–1.2)
Total Protein: 6.6 g/dL (ref 6.0–8.3)

## 2024-07-07 LAB — CBC WITH DIFFERENTIAL/PLATELET
Basophils Absolute: 0.2 K/uL — ABNORMAL HIGH (ref 0.0–0.1)
Basophils Relative: 2.6 % (ref 0.0–3.0)
Eosinophils Absolute: 0.2 K/uL (ref 0.0–0.7)
Eosinophils Relative: 2.9 % (ref 0.0–5.0)
HCT: 40.3 % (ref 36.0–46.0)
Hemoglobin: 13.4 g/dL (ref 12.0–15.0)
Lymphocytes Relative: 29 % (ref 12.0–46.0)
Lymphs Abs: 1.7 K/uL (ref 0.7–4.0)
MCHC: 33.3 g/dL (ref 30.0–36.0)
MCV: 87.1 fl (ref 78.0–100.0)
Monocytes Absolute: 0.4 K/uL (ref 0.1–1.0)
Monocytes Relative: 5.9 % (ref 3.0–12.0)
Neutro Abs: 3.6 K/uL (ref 1.4–7.7)
Neutrophils Relative %: 59.6 % (ref 43.0–77.0)
Platelets: 329 K/uL (ref 150.0–400.0)
RBC: 4.62 Mil/uL (ref 3.87–5.11)
RDW: 13.8 % (ref 11.5–15.5)
WBC: 6 K/uL (ref 4.0–10.5)

## 2024-07-07 LAB — LIPID PANEL
Cholesterol: 184 mg/dL (ref 0–200)
HDL: 75.4 mg/dL (ref >39.00)
LDL Cholesterol: 86 mg/dL (ref 0–99)
NonHDL: 108.73
Total CHOL/HDL Ratio: 2
Triglycerides: 112 mg/dL (ref 0.0–149.0)
VLDL: 22.4 mg/dL (ref 0.0–40.0)

## 2024-07-07 LAB — T4, FREE: Free T4: 0.79 ng/dL (ref 0.60–1.60)

## 2024-07-07 LAB — TSH: TSH: 0.57 u[IU]/mL (ref 0.35–5.50)

## 2024-07-07 LAB — VITAMIN D 25 HYDROXY (VIT D DEFICIENCY, FRACTURES): VITD: 41.09 ng/mL (ref 30.00–100.00)

## 2024-07-07 LAB — HEMOGLOBIN A1C: Hgb A1c MFr Bld: 5.7 % (ref 4.6–6.5)

## 2024-07-07 NOTE — Progress Notes (Unsigned)
 Patient Care Team: Kara Frieze, DO as PCP - General (Family Medicine) Vernetta Lonni GRADE, MD as Consulting Physician (Orthopedic Surgery) Melita Drivers, MD as Consulting Physician (Orthopedic Surgery)   Assessment & Plan:   1. Palpitations (Primary) Comments: Ectopy noted on exam with reassuring EKG. - EKG 12-Lead  2. Attention deficit hyperactivity disorder (ADHD), combined type  3. Post menopausal syndrome - estradiol  (ESTRACE ) 1 MG tablet; Take 1 tablet (1 mg total) by mouth daily.  Dispense: 90 tablet; Refill: 3  4. Vitamin D  deficiency - VITAMIN D  25 Hydroxy (Vit-D Deficiency, Fractures)  5. Acquired hypothyroidism - TSH - T4, free - levothyroxine  (SYNTHROID ) 88 MCG tablet; Take 1 tablet (88 mcg total) by mouth every morning on an empty stomach.  Dispense: 90 tablet; Refill: 3  6. Medication management - CBC with Differential/Platelet - Comprehensive metabolic panel with GFR  7. Prediabetes - Hemoglobin A1c - Lipid panel  8. Former smoker - Ambulatory Referral for Lung Cancer Screening [REF832]  9. Screening for colon cancer - Ambulatory referral to Gastroenterology  10. Encounter for screening mammogram for malignant neoplasm of breast - MM 3D SCREENING MAMMOGRAM BILATERAL BREAST; Future  11. Adjustment disorder with mixed anxiety and depressed mood Comments: Several deaths in her immediate family. Interested in therapy. - Ambulatory referral to Psychology - traZODone  (DESYREL ) 50 MG tablet; Take 0.5-1 tablets (25-50 mg total) by mouth at bedtime as needed for sleep.  Dispense: 90 tablet; Refill: 3  12. Family history of colon cancer - brother, mother  50. Family history of bladder cancer  14. Family history of liver cancer  Kennedy Prentiss, DO, MS, FAAFP, Dipl. [facility] [address] [place], [address] Dept: [phone] Dept Fax: [phone]  Subjective:   Review of Systems:  Negative, with the exception of above mentioned in HPI.  Medications:   Show/hide medication list[1]   Objective:   BP 116/74 (BP Location: Left Arm)   Pulse 62   Ht 5' 3 (1.6 m)   Wt 139 lb (63 kg)   LMP  (LMP Unknown)   SpO2 98%   BMI 24.62 kg/m   Physical Exam Constitutional:      General: She is not in acute distress.    Appearance: She is well-developed.  HENT:     Head: Normocephalic and atraumatic.  Eyes:     Conjunctiva/sclera: Conjunctivae normal.  Cardiovascular:     Rate and Rhythm: Normal rate and regular rhythm.     Heart sounds: Normal heart sounds.  Pulmonary:     Effort: Pulmonary effort is normal.     Breath sounds: Normal breath sounds.  Neurological:     General: No focal deficit present.     Mental Status: She is alert.  Psychiatric:        Behavior: Behavior normal.    Results for orders placed or performed in visit on 07/07/24  CBC with Differential/Platelet   Collection Time: 07/07/24 12:05 PM  Result Value Ref Range   WBC 6.0 4.0 - 10.5 K/uL   RBC 4.62 3.87 - 5.11 Mil/uL   Hemoglobin 13.4 12.0 - 15.0 g/dL   HCT 59.6 63.9 - 53.9 %   MCV 87.1 78.0 - 100.0 fl   MCHC 33.3 30.0 - 36.0 g/dL   RDW 86.1 88.4 - 84.4 %   Platelets 329.0 150.0 - 400.0 K/uL   Neutrophils Relative % 59.6 43.0 - 77.0 %   Lymphocytes Relative 29.0 12.0 - 46.0 %  Monocytes Relative 5.9 3.0 - 12.0 %   Eosinophils Relative 2.9 0.0 - 5.0 %   Basophils Relative 2.6 0.0 - 3.0 %   Neutro Abs 3.6 1.4 - 7.7 K/uL   Lymphs Abs 1.7 0.7 - 4.0 K/uL   Monocytes Absolute 0.4 0.1 - 1.0 K/uL   Eosinophils Absolute 0.2 0.0 - 0.7 K/uL   Basophils Absolute 0.2 (H) 0.0 - 0.1 K/uL  Comprehensive metabolic panel with GFR   Collection Time: 07/07/24 12:05 PM  Result Value Ref Range   Sodium 139 135 - 145 mEq/L   Potassium 4.1 3.5 - 5.1 mEq/L   Chloride 103 96 - 112 mEq/L   CO2 32 19 - 32 mEq/L   Glucose, Bld 101 (H) 70 - 99 mg/dL   BUN 11 6 - 23 mg/dL   Creatinine, Ser 9.29 0.40  - 1.20 mg/dL   Total Bilirubin 0.3 0.2 - 1.2 mg/dL   Alkaline Phosphatase 77 39 - 117 U/L   AST 19 0 - 37 U/L   ALT 17 0 - 35 U/L   Total Protein 6.6 6.0 - 8.3 g/dL   Albumin 4.3 3.5 - 5.2 g/dL   GFR 11.65 >39.99 mL/min   Calcium 9.3 8.4 - 10.5 mg/dL  Hemoglobin J8r   Collection Time: 07/07/24 12:05 PM  Result Value Ref Range   Hgb A1c MFr Bld 5.7 4.6 - 6.5 %  Lipid panel   Collection Time: 07/07/24 12:05 PM  Result Value Ref Range   Cholesterol 184 0 - 200 mg/dL   Triglycerides 887.9 0.0 - 149.0 mg/dL   HDL 24.59 >60.99 mg/dL   VLDL 77.5 0.0 - 59.9 mg/dL   LDL Cholesterol 86 0 - 99 mg/dL   Total CHOL/HDL Ratio 2    NonHDL 108.73   TSH   Collection Time: 07/07/24 12:05 PM  Result Value Ref Range   TSH 0.57 0.35 - 5.50 uIU/mL  T4, free   Collection Time: 07/07/24 12:05 PM  Result Value Ref Range   Free T4 0.79 0.60 - 1.60 ng/dL  VITAMIN D  25 Hydroxy (Vit-D Deficiency, Fractures)   Collection Time: 07/07/24 12:05 PM  Result Value Ref Range   VITD 41.09 30.00 - 100.00 ng/mL   Attestations:   Patient is well-known to me from previous care setting and is establishing care in this system with me as PCP., Available records reviewed., Chart updated today with reconciliation of problem list, medications, allergies, and relevant history. Preventive care and chronic disease status reviewed. , and Portions of historical chart may remain incomplete; will update on an ongoing basis as clinically indicated.     [1]  Outpatient Medications Prior to Visit  Medication Sig   cholecalciferol  (VITAMIN D ) 1000 units tablet Take 1,000 Units by mouth daily.   gabapentin  (NEURONTIN ) 300 MG capsule Take 4 capsules (1,200 mg total) by mouth at bedtime.   linaclotide  (LINZESS ) 290 MCG CAPS capsule Take 1 capsule (290 mcg total) by mouth daily before breakfast.   loratadine (CLARITIN) 10 MG tablet Take 10 mg by mouth daily.   meloxicam  (MOBIC ) 15 MG tablet Take 1 tablet (15 mg total) by mouth  daily.   montelukast  (SINGULAIR ) 10 MG tablet Take 1 tablet (10 mg total) by mouth at bedtime.   ondansetron  (ZOFRAN -ODT) 4 MG disintegrating tablet Take 1 tablet (4 mg total) by mouth every 8 (eight) hours as needed.   [DISCONTINUED] Atogepant  (QULIPTA ) 60 MG TABS Take 1 tablet (60 mg total) by mouth daily.   [DISCONTINUED] estradiol  (  ESTRACE ) 1 MG tablet Take 1 tablet (1 mg total) by mouth daily.   [DISCONTINUED] levothyroxine  (SYNTHROID ) 88 MCG tablet Take 1 tablet (88 mcg total) by mouth every morning on an empty stomach.   [DISCONTINUED] lisdexamfetamine  (VYVANSE ) 60 MG capsule Take 1 capsule (60 mg total) by mouth every morning. Needs office visit for further refills.   [DISCONTINUED] traZODone  (DESYREL ) 50 MG tablet Take 0.5-1 tablets (25-50 mg total) by mouth at bedtime as needed for sleep.   [DISCONTINUED] acetaminophen -codeine  (TYLENOL  #3) 300-30 MG tablet Take 1 tablet by mouth every 6 (six) hours as needed for pain.   [DISCONTINUED] albuterol  (VENTOLIN  HFA) 108 (90 Base) MCG/ACT inhaler Inhale 1-2 puffs into the lungs every 4 (four) to 6 (six) hours as needed.   [DISCONTINUED] amphetamine -dextroamphetamine  (ADDERALL XR) 30 MG 24 hr capsule Take 1 capsule (30 mg total) by mouth every morning.   [DISCONTINUED] amphetamine -dextroamphetamine  (ADDERALL XR) 30 MG 24 hr capsule Take 1 capsule (30 mg total) by mouth in the morning. (Patient not taking: Reported on 07/07/2024)   [DISCONTINUED] amphetamine -dextroamphetamine  (ADDERALL XR) 30 MG 24 hr capsule Take 1 capsule (30 mg total) by mouth every morning.   [DISCONTINUED] amphetamine -dextroamphetamine  (ADDERALL XR) 30 MG 24 hr capsule Take 1 capsule (30 mg total) by mouth every morning.   [DISCONTINUED] amphetamine -dextroamphetamine  (ADDERALL XR) 30 MG 24 hr capsule Take 1 capsule (30 mg total) by mouth every morning.   [DISCONTINUED] amphetamine -dextroamphetamine  (ADDERALL XR) 30 MG 24 hr capsule Take 1 capsule (30 mg total) by mouth every  morning.   [DISCONTINUED] amphetamine -dextroamphetamine  (ADDERALL XR) 30 MG 24 hr capsule Take 1 capsule (30 mg total) by mouth daily. DNF 05/02/24 (Patient not taking: Reported on 07/07/2024)   [DISCONTINUED] amphetamine -dextroamphetamine  (ADDERALL) 10 MG tablet Take 1 tablet (10 mg total) by mouth daily in the afternoon.   [DISCONTINUED] amphetamine -dextroamphetamine  (ADDERALL) 10 MG tablet Take 1 tablet (10 mg total) by mouth daily in the afternoon.   [DISCONTINUED] azithromycin  (ZITHROMAX ) 250 MG tablet Take 2 tablets by mouth once daily on day one and take 1 tablet by mouth on days 2-5 until gone. 5 days   [DISCONTINUED] COVID-19 At Home Antigen Test (CARESTART COVID-19 HOME TEST) KIT Use as directed within package instructions   [DISCONTINUED] cyclobenzaprine  (FLEXERIL ) 10 MG tablet Take 1 tablet (10 mg total) by mouth 3 (three) times daily as needed for muscle spasms.   [DISCONTINUED] estradiol  (ESTRACE ) 1 MG tablet Take 1 tablet (1 mg total) by mouth daily.   [DISCONTINUED] estradiol  (ESTRACE ) 1 MG tablet Take 1 tablet (1 mg total) by mouth daily.   [DISCONTINUED] gabapentin  (NEURONTIN ) 300 MG capsule Take 4 capsules by mouth at bedtime.   [DISCONTINUED] HYDROcodone -acetaminophen  (NORCO/VICODIN) 5-325 MG tablet take 1 tablet by mouth every 4 hours as needed for pain   [DISCONTINUED] levothyroxine  (SYNTHROID ) 88 MCG tablet Take 1 tablet (88 mcg total) by mouth in the morning on an empty stomach.   [DISCONTINUED] linaclotide  (LINZESS ) 290 MCG CAPS capsule Take 1 capsule (290 mcg total) at least 30 minutes before the first meal of the day on an empty stomach.   [DISCONTINUED] meloxicam  (MOBIC ) 15 MG tablet Take 1 tablet (15 mg total) by mouth daily.   [DISCONTINUED] montelukast  (SINGULAIR ) 10 MG tablet Take 1 tablet (10 mg total) by mouth at bedtime.   [DISCONTINUED] ondansetron  (ZOFRAN ) 4 MG tablet Take 1 tablet (4 mg total) by mouth daily as needed for nausea or vomiting.   [DISCONTINUED]  oxyCODONE -acetaminophen  (PERCOCET/ROXICET) 5-325 MG tablet Take 1 tablet by mouth  every 4 (four) hours as needed for severe pain (pain score 7-10).   [DISCONTINUED] silver  sulfADIAZINE  (SILVADENE ) 1 % cream Apply 1 application topically twice daily for 10 days as directed.   [DISCONTINUED] SYNTHROID  88 MCG tablet Take 1 tablet (88 mcg total) by mouth every morning.   [DISCONTINUED] tamsulosin  (FLOMAX ) 0.4 MG CAPS capsule Take 1 capsule (0.4 mg total) by mouth daily after supper.   [DISCONTINUED] traZODone  (DESYREL ) 50 MG tablet TAKE 1/2-1 TABLET BY MOUTH AT BEDTIME AS NEEDED FOR SLEEP.   [DISCONTINUED] traZODone  (DESYREL ) 50 MG tablet Take 0.5-1 tablets (25-50 mg total) by mouth at bedtime as needed for sleep.   No facility-administered medications prior to visit.

## 2024-07-10 ENCOUNTER — Encounter: Payer: Self-pay | Admitting: Family Medicine

## 2024-07-10 DIAGNOSIS — Z8052 Family history of malignant neoplasm of bladder: Secondary | ICD-10-CM | POA: Insufficient documentation

## 2024-07-10 DIAGNOSIS — Z800 Family history of malignant neoplasm of digestive organs: Secondary | ICD-10-CM | POA: Insufficient documentation

## 2024-07-10 DIAGNOSIS — R7303 Prediabetes: Secondary | ICD-10-CM | POA: Insufficient documentation

## 2024-07-10 DIAGNOSIS — F4323 Adjustment disorder with mixed anxiety and depressed mood: Secondary | ICD-10-CM | POA: Insufficient documentation

## 2024-07-10 DIAGNOSIS — Z87891 Personal history of nicotine dependence: Secondary | ICD-10-CM | POA: Insufficient documentation

## 2024-07-10 MED ORDER — ESTRADIOL 1 MG PO TABS
1.0000 mg | ORAL_TABLET | Freq: Every day | ORAL | 3 refills | Status: AC
  Filled 2024-07-10: qty 90, 90d supply, fill #0

## 2024-07-10 MED ORDER — LEVOTHYROXINE SODIUM 88 MCG PO TABS
88.0000 ug | ORAL_TABLET | Freq: Every morning | ORAL | 3 refills | Status: AC
  Filled 2024-07-10 – 2024-07-14 (×2): qty 90, 90d supply, fill #0

## 2024-07-10 MED ORDER — TRAZODONE HCL 50 MG PO TABS
25.0000 mg | ORAL_TABLET | Freq: Every evening | ORAL | 3 refills | Status: AC | PRN
  Filled 2024-07-10: qty 90, 90d supply, fill #0

## 2024-07-11 ENCOUNTER — Other Ambulatory Visit: Payer: Self-pay

## 2024-07-11 ENCOUNTER — Other Ambulatory Visit (HOSPITAL_COMMUNITY): Payer: Self-pay

## 2024-07-14 ENCOUNTER — Other Ambulatory Visit (HOSPITAL_COMMUNITY): Payer: Self-pay

## 2024-07-14 ENCOUNTER — Other Ambulatory Visit: Payer: Self-pay

## 2024-07-19 ENCOUNTER — Ambulatory Visit
Admission: RE | Admit: 2024-07-19 | Discharge: 2024-07-19 | Disposition: A | Discharge status: Home / Self Care | Source: Ambulatory Visit | Attending: Family Medicine | Admitting: Family Medicine

## 2024-07-19 DIAGNOSIS — Z1231 Encounter for screening mammogram for malignant neoplasm of breast: Secondary | ICD-10-CM

## 2024-07-23 ENCOUNTER — Other Ambulatory Visit: Payer: Self-pay | Admitting: Family Medicine

## 2024-07-23 DIAGNOSIS — K5909 Other constipation: Secondary | ICD-10-CM

## 2024-07-25 ENCOUNTER — Other Ambulatory Visit (HOSPITAL_COMMUNITY): Payer: Self-pay

## 2024-07-25 ENCOUNTER — Other Ambulatory Visit: Payer: Self-pay

## 2024-07-25 MED ORDER — LINACLOTIDE 290 MCG PO CAPS
290.0000 ug | ORAL_CAPSULE | Freq: Every day | ORAL | 3 refills | Status: DC
  Filled 2024-07-25: qty 90, 90d supply, fill #0

## 2024-07-27 ENCOUNTER — Other Ambulatory Visit (HOSPITAL_COMMUNITY): Payer: Self-pay

## 2024-07-27 MED ORDER — LINACLOTIDE 290 MCG PO CAPS
290.0000 ug | ORAL_CAPSULE | Freq: Every day | ORAL | 3 refills | Status: AC
  Filled 2024-07-27: qty 90, 90d supply, fill #0

## 2024-07-27 MED ORDER — LINACLOTIDE 290 MCG PO CAPS
290.0000 ug | ORAL_CAPSULE | Freq: Every day | ORAL | 3 refills | Status: DC
  Filled 2024-07-27: qty 90, 90d supply, fill #0

## 2024-08-17 ENCOUNTER — Telehealth: Payer: Self-pay

## 2024-08-17 ENCOUNTER — Encounter: Payer: Self-pay | Admitting: Urology

## 2024-08-17 DIAGNOSIS — N200 Calculus of kidney: Secondary | ICD-10-CM

## 2024-08-17 NOTE — Telephone Encounter (Signed)
 Return call to pt. Verbal from Dr. Sherrilee to placed order for KUB. Voiced understanding

## 2024-08-24 ENCOUNTER — Ambulatory Visit (HOSPITAL_COMMUNITY)
Admission: RE | Admit: 2024-08-24 | Discharge: 2024-08-24 | Disposition: A | Discharge status: Home / Self Care | Source: Ambulatory Visit | Attending: Urology | Admitting: Urology

## 2024-08-24 DIAGNOSIS — N200 Calculus of kidney: Secondary | ICD-10-CM | POA: Diagnosis present

## 2024-08-30 ENCOUNTER — Telehealth: Payer: Self-pay

## 2024-08-31 ENCOUNTER — Other Ambulatory Visit (HOSPITAL_COMMUNITY): Payer: Self-pay

## 2024-08-31 ENCOUNTER — Ambulatory Visit (HOSPITAL_COMMUNITY): Payer: Self-pay | Admitting: Psychiatry

## 2024-08-31 VITALS — BP 132/71 | HR 73

## 2024-08-31 DIAGNOSIS — Z8659 Personal history of other mental and behavioral disorders: Secondary | ICD-10-CM | POA: Diagnosis not present

## 2024-08-31 DIAGNOSIS — F4323 Adjustment disorder with mixed anxiety and depressed mood: Secondary | ICD-10-CM | POA: Diagnosis not present

## 2024-08-31 MED ORDER — SERTRALINE HCL 25 MG PO TABS
25.0000 mg | ORAL_TABLET | Freq: Every day | ORAL | 0 refills | Status: AC
  Filled 2024-08-31: qty 90, 90d supply, fill #0

## 2024-09-01 ENCOUNTER — Other Ambulatory Visit (HOSPITAL_COMMUNITY): Payer: Self-pay

## 2024-09-01 NOTE — Addendum Note (Signed)
 Addended by: CARVIN CROCK on: 09/01/2024 08:28 AM   Modules accepted: Level of Service

## 2024-09-02 ENCOUNTER — Encounter: Payer: Self-pay | Admitting: Urology

## 2024-09-14 ENCOUNTER — Ambulatory Visit (HOSPITAL_COMMUNITY): Admitting: Licensed Clinical Social Worker

## 2024-10-06 ENCOUNTER — Ambulatory Visit: Admitting: Family Medicine

## 2024-10-24 ENCOUNTER — Ambulatory Visit (HOSPITAL_COMMUNITY): Admitting: Psychiatry
